# Patient Record
Sex: Male | Born: 1940
Health system: Southern US, Community
[De-identification: ages and names within clinical notes are randomized; demographics above are authoritative.]

## PROBLEM LIST (undated history)

## (undated) DIAGNOSIS — Z7901 Long term (current) use of anticoagulants: Secondary | ICD-10-CM

## (undated) DIAGNOSIS — Z87442 Personal history of urinary calculi: Secondary | ICD-10-CM

## (undated) DIAGNOSIS — Z86010 Personal history of colonic polyps: Secondary | ICD-10-CM

## (undated) DIAGNOSIS — I499 Cardiac arrhythmia, unspecified: Secondary | ICD-10-CM

## (undated) DIAGNOSIS — I10 Essential (primary) hypertension: Secondary | ICD-10-CM

## (undated) DIAGNOSIS — C61 Malignant neoplasm of prostate: Secondary | ICD-10-CM

## (undated) DIAGNOSIS — K602 Anal fissure, unspecified: Secondary | ICD-10-CM

## (undated) DIAGNOSIS — M1990 Unspecified osteoarthritis, unspecified site: Secondary | ICD-10-CM

## (undated) DIAGNOSIS — C4490 Unspecified malignant neoplasm of skin, unspecified: Secondary | ICD-10-CM

## (undated) DIAGNOSIS — I4891 Unspecified atrial fibrillation: Secondary | ICD-10-CM

## (undated) DIAGNOSIS — K219 Gastro-esophageal reflux disease without esophagitis: Secondary | ICD-10-CM

## (undated) DIAGNOSIS — H409 Unspecified glaucoma: Secondary | ICD-10-CM

## (undated) DIAGNOSIS — Z9289 Personal history of other medical treatment: Secondary | ICD-10-CM

## (undated) DIAGNOSIS — R1013 Epigastric pain: Secondary | ICD-10-CM

## (undated) HISTORY — PX: UPPER GASTROINTESTINAL ENDOSCOPY: SHX188

## (undated) HISTORY — DX: Unspecified atrial fibrillation: I48.91

## (undated) HISTORY — DX: Gastro-esophageal reflux disease without esophagitis: K21.9

## (undated) HISTORY — DX: Essential (primary) hypertension: I10

## (undated) HISTORY — DX: Personal history of colonic polyps: Z86.010

## (undated) HISTORY — PX: OTHER SURGICAL HISTORY: SHX169

## (undated) HISTORY — DX: Unspecified glaucoma: H40.9

## (undated) HISTORY — PX: COLONOSCOPY: SHX174

## (undated) HISTORY — DX: Long term (current) use of anticoagulants: Z79.01

## (undated) HISTORY — DX: Personal history of other medical treatment: Z92.89

## (undated) HISTORY — PX: COLONOSCOPY WITH ESOPHAGOGASTRODUODENOSCOPY (EGD): SHX5779

## (undated) HISTORY — DX: Epigastric pain: R10.13

## (undated) HISTORY — PX: PROSTATE BIOPSY: SHX241

## (undated) HISTORY — DX: Anal fissure, unspecified: K60.2

## (undated) HISTORY — DX: Personal history of urinary calculi: Z87.442

---

## 1977-12-31 HISTORY — PX: BACK SURGERY: SHX140

## 2006-08-01 ENCOUNTER — Emergency Department (HOSPITAL_COMMUNITY): Admission: EM | Admit: 2006-08-01 | Discharge: 2006-08-01 | Payer: Self-pay | Admitting: Emergency Medicine

## 2010-01-23 ENCOUNTER — Encounter: Admission: RE | Admit: 2010-01-23 | Discharge: 2010-01-23 | Payer: Self-pay | Admitting: Cardiology

## 2010-01-27 ENCOUNTER — Ambulatory Visit (HOSPITAL_COMMUNITY): Admission: RE | Admit: 2010-01-27 | Discharge: 2010-01-27 | Payer: Self-pay | Admitting: Cardiology

## 2010-08-07 ENCOUNTER — Ambulatory Visit: Payer: Self-pay | Admitting: Cardiology

## 2010-09-07 ENCOUNTER — Ambulatory Visit: Payer: Self-pay | Admitting: Cardiology

## 2010-09-21 ENCOUNTER — Ambulatory Visit: Payer: Self-pay | Admitting: Cardiology

## 2010-10-05 ENCOUNTER — Ambulatory Visit: Payer: Self-pay | Admitting: Cardiology

## 2010-10-19 ENCOUNTER — Ambulatory Visit: Payer: Self-pay | Admitting: Cardiology

## 2010-11-02 ENCOUNTER — Ambulatory Visit: Payer: Self-pay | Admitting: Cardiology

## 2010-11-17 ENCOUNTER — Ambulatory Visit: Payer: Self-pay | Admitting: Cardiology

## 2010-12-01 ENCOUNTER — Ambulatory Visit: Payer: Self-pay | Admitting: Cardiology

## 2010-12-31 DIAGNOSIS — Z860100 Personal history of colon polyps, unspecified: Secondary | ICD-10-CM

## 2010-12-31 HISTORY — DX: Personal history of colon polyps, unspecified: Z86.0100

## 2010-12-31 HISTORY — DX: Personal history of colonic polyps: Z86.010

## 2011-01-10 ENCOUNTER — Ambulatory Visit: Payer: Self-pay | Admitting: Cardiology

## 2011-01-31 ENCOUNTER — Other Ambulatory Visit: Payer: Self-pay

## 2011-02-05 ENCOUNTER — Other Ambulatory Visit (INDEPENDENT_AMBULATORY_CARE_PROVIDER_SITE_OTHER): Payer: Medicare Other

## 2011-02-05 DIAGNOSIS — I4891 Unspecified atrial fibrillation: Secondary | ICD-10-CM

## 2011-02-05 DIAGNOSIS — Z7901 Long term (current) use of anticoagulants: Secondary | ICD-10-CM

## 2011-02-21 ENCOUNTER — Encounter (INDEPENDENT_AMBULATORY_CARE_PROVIDER_SITE_OTHER): Payer: Medicare Other

## 2011-02-21 DIAGNOSIS — R079 Chest pain, unspecified: Secondary | ICD-10-CM

## 2011-02-21 DIAGNOSIS — Z7901 Long term (current) use of anticoagulants: Secondary | ICD-10-CM

## 2011-02-21 DIAGNOSIS — I4891 Unspecified atrial fibrillation: Secondary | ICD-10-CM

## 2011-05-29 ENCOUNTER — Ambulatory Visit: Payer: Medicare Other | Admitting: Cardiology

## 2011-05-29 ENCOUNTER — Telehealth: Payer: Self-pay | Admitting: Cardiology

## 2011-05-31 ENCOUNTER — Encounter: Payer: Self-pay | Admitting: Cardiology

## 2011-05-31 DIAGNOSIS — I4891 Unspecified atrial fibrillation: Secondary | ICD-10-CM | POA: Insufficient documentation

## 2011-05-31 DIAGNOSIS — I4821 Permanent atrial fibrillation: Secondary | ICD-10-CM | POA: Insufficient documentation

## 2011-06-01 ENCOUNTER — Encounter: Payer: Medicare Other | Admitting: *Deleted

## 2011-06-11 ENCOUNTER — Ambulatory Visit (INDEPENDENT_AMBULATORY_CARE_PROVIDER_SITE_OTHER): Payer: Medicare Other | Admitting: *Deleted

## 2011-06-11 DIAGNOSIS — I4891 Unspecified atrial fibrillation: Secondary | ICD-10-CM

## 2011-07-09 ENCOUNTER — Ambulatory Visit (INDEPENDENT_AMBULATORY_CARE_PROVIDER_SITE_OTHER): Payer: Medicare Other | Admitting: *Deleted

## 2011-07-09 DIAGNOSIS — I4891 Unspecified atrial fibrillation: Secondary | ICD-10-CM

## 2011-07-10 ENCOUNTER — Telehealth: Payer: Self-pay | Admitting: Cardiology

## 2011-07-10 NOTE — Telephone Encounter (Signed)
Ms. Lumadue said she needs to speak with Juliette Alcide.  Has questions on the instructions with patient medication.  They will be leaving in about 30 minutes.  Does not want to leave message, wants to speak with [facility].  Advised to patient wife that Juliette Alcide is on the phone.  Placed patient on hold.  Lost call.. Will advise nurse.

## 2011-07-10 NOTE — Telephone Encounter (Signed)
Needs you to call her back around 3:30 because she had a question about how to split up her husbands medication doses. Please call back. I have pulled his chart.

## 2011-07-10 NOTE — Telephone Encounter (Signed)
Questions about coumadin dose, went over with wife

## 2011-07-20 ENCOUNTER — Other Ambulatory Visit: Payer: Self-pay | Admitting: *Deleted

## 2011-07-20 MED ORDER — SPIRONOLACTONE 25 MG PO TABS: 25.0000 mg | ORAL_TABLET | Freq: Every day | ORAL | Status: DC

## 2011-07-20 NOTE — Telephone Encounter (Signed)
Refilled meds per fax request.  

## 2011-07-23 ENCOUNTER — Ambulatory Visit (INDEPENDENT_AMBULATORY_CARE_PROVIDER_SITE_OTHER): Payer: Medicare Other | Admitting: *Deleted

## 2011-07-23 ENCOUNTER — Encounter: Payer: Medicare Other | Admitting: *Deleted

## 2011-07-23 ENCOUNTER — Other Ambulatory Visit: Payer: Self-pay | Admitting: *Deleted

## 2011-07-23 DIAGNOSIS — I4891 Unspecified atrial fibrillation: Secondary | ICD-10-CM

## 2011-08-17 ENCOUNTER — Other Ambulatory Visit: Payer: Self-pay | Admitting: *Deleted

## 2011-08-17 DIAGNOSIS — I119 Hypertensive heart disease without heart failure: Secondary | ICD-10-CM

## 2011-08-17 MED ORDER — OLMESARTAN MEDOXOMIL-HCTZ 40-25 MG PO TABS: 1.0000 | ORAL_TABLET | Freq: Every day | ORAL | Status: DC

## 2011-08-17 NOTE — Telephone Encounter (Signed)
Refilled meds per fax request.  

## 2011-08-20 ENCOUNTER — Ambulatory Visit (INDEPENDENT_AMBULATORY_CARE_PROVIDER_SITE_OTHER): Payer: Medicare Other | Admitting: *Deleted

## 2011-08-20 DIAGNOSIS — I4891 Unspecified atrial fibrillation: Secondary | ICD-10-CM

## 2011-08-20 LAB — POCT INR: INR: 2.7

## 2011-09-17 ENCOUNTER — Encounter: Payer: Medicare Other | Admitting: *Deleted

## 2011-09-19 ENCOUNTER — Ambulatory Visit (INDEPENDENT_AMBULATORY_CARE_PROVIDER_SITE_OTHER): Payer: Medicare Other | Admitting: *Deleted

## 2011-09-19 DIAGNOSIS — I4891 Unspecified atrial fibrillation: Secondary | ICD-10-CM

## 2011-09-27 ENCOUNTER — Other Ambulatory Visit: Payer: Self-pay | Admitting: *Deleted

## 2011-09-27 MED ORDER — ATENOLOL 50 MG PO TABS: 50.0000 mg | ORAL_TABLET | Freq: Every day | ORAL | Status: DC

## 2011-09-27 NOTE — Telephone Encounter (Signed)
Refilled atenolol

## 2011-10-10 ENCOUNTER — Encounter: Payer: Medicare Other | Admitting: *Deleted

## 2011-10-10 ENCOUNTER — Ambulatory Visit (INDEPENDENT_AMBULATORY_CARE_PROVIDER_SITE_OTHER): Payer: Medicare Other | Admitting: *Deleted

## 2011-10-10 DIAGNOSIS — Z7901 Long term (current) use of anticoagulants: Secondary | ICD-10-CM | POA: Insufficient documentation

## 2011-10-10 DIAGNOSIS — I4891 Unspecified atrial fibrillation: Secondary | ICD-10-CM

## 2011-10-10 LAB — POCT INR: INR: 2.6

## 2011-11-07 ENCOUNTER — Ambulatory Visit (INDEPENDENT_AMBULATORY_CARE_PROVIDER_SITE_OTHER): Payer: Medicare Other | Admitting: *Deleted

## 2011-11-07 DIAGNOSIS — Z7901 Long term (current) use of anticoagulants: Secondary | ICD-10-CM

## 2011-11-07 DIAGNOSIS — I4891 Unspecified atrial fibrillation: Secondary | ICD-10-CM

## 2011-11-07 LAB — POCT INR: INR: 3.5

## 2011-11-07 NOTE — Telephone Encounter (Signed)
No action required.

## 2011-11-21 ENCOUNTER — Ambulatory Visit (INDEPENDENT_AMBULATORY_CARE_PROVIDER_SITE_OTHER): Payer: Medicare Other | Admitting: *Deleted

## 2011-11-21 DIAGNOSIS — Z7901 Long term (current) use of anticoagulants: Secondary | ICD-10-CM

## 2011-11-21 DIAGNOSIS — I4891 Unspecified atrial fibrillation: Secondary | ICD-10-CM

## 2011-11-21 LAB — POCT INR: INR: 2.8

## 2011-12-11 ENCOUNTER — Ambulatory Visit: Payer: Medicare Other | Admitting: Gastroenterology

## 2011-12-19 ENCOUNTER — Encounter: Payer: Medicare Other | Admitting: *Deleted

## 2011-12-24 ENCOUNTER — Encounter: Payer: Medicare Other | Admitting: *Deleted

## 2012-01-02 ENCOUNTER — Other Ambulatory Visit: Payer: Self-pay | Admitting: Cardiology

## 2012-01-04 ENCOUNTER — Ambulatory Visit (INDEPENDENT_AMBULATORY_CARE_PROVIDER_SITE_OTHER): Payer: Medicare Other | Admitting: Gastroenterology

## 2012-01-04 ENCOUNTER — Encounter: Payer: Self-pay | Admitting: Gastroenterology

## 2012-01-04 VITALS — BP 132/68 | HR 60 | Ht 71.0 in | Wt 223.0 lb

## 2012-01-04 DIAGNOSIS — K219 Gastro-esophageal reflux disease without esophagitis: Secondary | ICD-10-CM

## 2012-01-04 NOTE — Progress Notes (Signed)
HPI: This is a  very pleasant 72 year old man who I am meeting for the first time today.  Was having a lot of pyoris, belching.  Was put PPI once daily usually in AM, then eats BF 30 min later.  One month and on that pill he is back to normal.  He was pretty miserable for about 6 months to 1 year.  Has had minor dysphagia at times.  His weight has been stable, maybe up.   No vomiting, no overt bleeding.  Never had EGD.  Did have colonoscopy with Dr. Elnoria Howard and he was happy with that care.  He has started dinking vodka tonics a bit more over the past year.  Used to drink 1/2 glass wine but started drinking more vodka about a year ago.  Recently quit altogether.  Review of systems: Pertinent positive and negative review of systems were noted in the above HPI section. Complete review of systems was performed and was otherwise normal.    Past Medical History  Diagnosis Date  . Hypertension   . Campath-induced atrial fibrillation     maintaining normal sinus rhythm  . Dyspepsia   . Chronic anticoagulation     coumadin  . History of kidney stones   . Anal fissure     hx of   . History of colon polyps 2012    Colonoscopy-Dr. Elnoria Howard     Past Surgical History  Procedure Date  . Back surgery 12/31/1977    for degenerative disc disease    Current Outpatient Prescriptions  Medication Sig Dispense Refill  . aspirin 81 MG chewable tablet Chew 81 mg by mouth daily.        [name] atenolol (TENORMIN) 50 MG tablet TAKE 1 TABLET (50 MG TOTAL) BY MOUTH DAILY.  90 tablet  5  . Methylcellulose, Laxative, (CITRUCEL PO) Take by mouth as directed.        . Multiple Vitamins-Minerals (CENTRUM SILVER PO) Take 1 tablet by mouth every morning.        . olmesartan-hydrochlorothiazide (BENICAR HCT) 40-25 MG per tablet Take 1 tablet by mouth daily.  30 tablet  5  . omeprazole (PRILOSEC) 40 MG capsule Take 40 mg by mouth daily.        [name] spironolactone (ALDACTONE) 25 MG tablet Take 1 tablet (25 mg total) by mouth  daily.  60 tablet  3  . warfarin (COUMADIN) 5 MG tablet Take 5 mg by mouth daily. Take as directed by coumadin clinic         Allergies as of 01/04/2012 - Review Complete 01/04/2012  Allergen Reaction Noted  . Amlodipine  05/31/2011  . Lisinopril Cough 05/31/2011    Family History  Problem Relation Age of Onset  . Heart failure Father   . Breast cancer Mother   . Cancer Father   . Colon cancer Maternal Grandmother     History   Social History  . Marital Status: Single    Spouse Name: N/A    Number of Children: 3  . Years of Education: N/A   Occupational History  . Retired    Social History Main Topics  . Smoking status: Former Smoker    Types: Cigarettes    Quit date: 12/31/1966  . Smokeless tobacco: Never Used  . Alcohol Use: 0.0 oz/week     1 daily   . Drug Use: No  . Sexually Active: Not on file   Other Topics Concern  . Not on file   Social History Narrative  Daily caffeine       Physical Exam: BP 132/68  Pulse 60  Ht 5\' 11"  (1.803 m)  Wt 223 lb (101.152 kg)  BMI 31.10 kg/m2 Constitutional: generally well-appearing Psychiatric: alert and oriented x3 Eyes: extraocular movements intact Mouth: oral pharynx moist, no lesions Neck: supple no lymphadenopathy Cardiovascular: heart regular rate and rhythm Lungs: clear to auscultation bilaterally Abdomen: soft, nontender, nondistended, no obvious ascites, no peritoneal signs, normal bowel sounds Extremities: no lower extremity edema bilaterally Skin: no lesions on visible extremities    Assessment and plan: 72 y.o. male with  pyrosis, mild intermittent dysphagia  I will proceed with EGD at his soonest convenience. Proton pump inhibitor seems to be working very well and he will continue that. The timing of the onset of his symptoms seems to coincide with his drinking more alcohol than usual and he has since cut back quite dramatically. I see no reason for any further blood tests or imaging studies  before the endoscopy

## 2012-01-04 NOTE — Patient Instructions (Signed)
You will be set up for an upper endoscopy. Stay on omeprazole once daily for another 2 months, then try cutting back to every other day if tolerated by heartburn symptoms. A copy of this information will be made available to [name]. Can stay on your coumadin before the test.

## 2012-01-09 ENCOUNTER — Ambulatory Visit (AMBULATORY_SURGERY_CENTER): Payer: Medicare Other | Admitting: Gastroenterology

## 2012-01-09 ENCOUNTER — Telehealth: Payer: Self-pay | Admitting: Gastroenterology

## 2012-01-09 ENCOUNTER — Encounter: Payer: Self-pay | Admitting: Gastroenterology

## 2012-01-09 VITALS — BP 131/79 | HR 52 | Temp 97.1°F | Resp 20 | Ht 71.0 in | Wt 223.0 lb

## 2012-01-09 DIAGNOSIS — K219 Gastro-esophageal reflux disease without esophagitis: Secondary | ICD-10-CM

## 2012-01-09 DIAGNOSIS — K2990 Gastroduodenitis, unspecified, without bleeding: Secondary | ICD-10-CM

## 2012-01-09 MED ORDER — SODIUM CHLORIDE 0.9 % IV SOLN: 500.0000 mL | INTRAVENOUS | Status: DC

## 2012-01-09 NOTE — Patient Instructions (Signed)
Continue ppi once daily for 2 months then try cutting back to one pill every other day.D/C instructions reviewed with family. Information given on esophagitis,gastritis and hiatal hernia.Resume remainder of medication.

## 2012-01-09 NOTE — Telephone Encounter (Signed)
Colonoscopy, August 2011, Dr. Jeani Hawking: Indication was family history of colon cancer. 2 small polyps were found, these were adenomatous on biopsy. Recall colonoscopy at 5 year interval. Also noted hemorrhoids.  Patty,  Can you please put him in for repeat colonoscopy August 2016 please

## 2012-01-09 NOTE — Telephone Encounter (Signed)
Recall in EPIC 

## 2012-01-09 NOTE — Progress Notes (Signed)
Patient did not experience any of the following events: a burn prior to discharge; a fall within the facility; wrong site/side/patient/procedure/implant event; or a hospital transfer or hospital admission upon discharge from the facility. (G8907) Patient did not have preoperative order for IV antibiotic SSI prophylaxis. (G8918)  

## 2012-01-09 NOTE — Progress Notes (Signed)
At the end of the procedure, pt's sats drop to 78%  o2 up to 4/L and sats up to 92-96%

## 2012-01-09 NOTE — Op Note (Signed)
[facility] [address] [place], [place]  [address]  ENDOSCOPY PROCEDURE REPORT  PATIENT:  Daniel, Love  MR#:  [id number] BIRTHDATE:  [birth year], 71 yrs. old  GENDER:  male ENDOSCOPIST:  Rachael Fee, MD Referred by:  Italy Badger, M.D. PROCEDURE DATE:  01/09/2012 PROCEDURE:  EGD, diagnostic 43235 ASA CLASS:  Class II INDICATIONS:  GERD, intermittent mild dysphagia MEDICATIONS:    Fentanyl 50 mcg IV, These medications were titrated to patient response per physician's verbal order, Versed 6 mg IV TOPICAL ANESTHETIC:  Cetacaine Spray  DESCRIPTION OF PROCEDURE:   After the risks benefits and alternatives of the procedure were thoroughly explained, informed consent was obtained.  The LB [id number]-H180 [id number] endoscope was introduced through the mouth and advanced to the second portion of the duodenum, without limitations.  The instrument was slowly withdrawn as the mucosa was fully examined. <<PROCEDUREIMAGES>> Esophagitis was found. There was mild, reflux related distal esophagitis (erythema at GE junction) (see image1).  There was mild, non-specific gastritis (see image2).  A hiatal hernia was found. This was 1-2cm.  Otherwise the examination was normal (see image3, image4, and image5).    Retroflexed views revealed no abnormalities.    The scope was then withdrawn from the patient and the procedure completed. COMPLICATIONS:  None  ENDOSCOPIC IMPRESSION: 1) Esophagitis, reflux related 2) Mild gastritis 3) Small hiatal hernia 4) Otherwise normal examination  RECOMMENDATIONS: Continue taking the PPI once daily for another 2 months, then try cutting back to one pill every other day.  ______________________________ Rachael Fee, MD  n. eSIGNED:   Rachael Fee at 01/09/2012 09:11 AM  Earlene Plater, [phone]

## 2012-01-10 ENCOUNTER — Telehealth: Payer: Self-pay | Admitting: *Deleted

## 2012-01-10 NOTE — Telephone Encounter (Signed)

## 2012-01-15 ENCOUNTER — Telehealth: Payer: Self-pay

## 2012-01-15 NOTE — Telephone Encounter (Signed)
Received fax refill request for Warfarin 5mg  tablets from [facility], pt is past due for INR check.  Attempted to call pt to schedule f/u appt.  LMOM TCB to schedule.

## 2012-01-17 ENCOUNTER — Other Ambulatory Visit: Payer: Self-pay | Admitting: Cardiology

## 2012-01-17 MED ORDER — WARFARIN SODIUM 5 MG PO TABS: ORAL_TABLET | ORAL | Status: DC

## 2012-01-17 NOTE — Telephone Encounter (Signed)
Coumadin 10 tablets sent to pharmacy. The pt is overdue for his INR. Will give additional tablets after appt completed 1/21. Appt scheduled for the pt. Wife aware of need for appt and that pt is overdue.

## 2012-01-21 ENCOUNTER — Ambulatory Visit (INDEPENDENT_AMBULATORY_CARE_PROVIDER_SITE_OTHER): Payer: Medicare Other | Admitting: *Deleted

## 2012-01-21 DIAGNOSIS — I4891 Unspecified atrial fibrillation: Secondary | ICD-10-CM

## 2012-01-21 DIAGNOSIS — Z7901 Long term (current) use of anticoagulants: Secondary | ICD-10-CM

## 2012-01-21 MED ORDER — WARFARIN SODIUM 5 MG PO TABS: ORAL_TABLET | ORAL | Status: DC

## 2012-02-15 ENCOUNTER — Ambulatory Visit (INDEPENDENT_AMBULATORY_CARE_PROVIDER_SITE_OTHER): Payer: Medicare Other | Admitting: *Deleted

## 2012-02-15 DIAGNOSIS — Z7901 Long term (current) use of anticoagulants: Secondary | ICD-10-CM

## 2012-02-15 DIAGNOSIS — I4891 Unspecified atrial fibrillation: Secondary | ICD-10-CM

## 2012-02-29 ENCOUNTER — Ambulatory Visit (INDEPENDENT_AMBULATORY_CARE_PROVIDER_SITE_OTHER): Payer: Medicare Other | Admitting: *Deleted

## 2012-02-29 DIAGNOSIS — I4891 Unspecified atrial fibrillation: Secondary | ICD-10-CM

## 2012-02-29 DIAGNOSIS — Z7901 Long term (current) use of anticoagulants: Secondary | ICD-10-CM

## 2012-02-29 LAB — POCT INR: INR: 2.2

## 2012-03-13 ENCOUNTER — Other Ambulatory Visit: Payer: Self-pay | Admitting: *Deleted

## 2012-03-13 DIAGNOSIS — I119 Hypertensive heart disease without heart failure: Secondary | ICD-10-CM

## 2012-03-13 MED ORDER — OLMESARTAN MEDOXOMIL-HCTZ 40-25 MG PO TABS: 1.0000 | ORAL_TABLET | Freq: Every day | ORAL | Status: DC

## 2012-03-13 MED ORDER — SPIRONOLACTONE 25 MG PO TABS: 25.0000 mg | ORAL_TABLET | Freq: Every day | ORAL | Status: DC

## 2012-03-21 ENCOUNTER — Ambulatory Visit (INDEPENDENT_AMBULATORY_CARE_PROVIDER_SITE_OTHER): Payer: Medicare Other | Admitting: *Deleted

## 2012-03-21 DIAGNOSIS — I4891 Unspecified atrial fibrillation: Secondary | ICD-10-CM

## 2012-03-21 DIAGNOSIS — Z7901 Long term (current) use of anticoagulants: Secondary | ICD-10-CM

## 2012-04-11 ENCOUNTER — Ambulatory Visit (INDEPENDENT_AMBULATORY_CARE_PROVIDER_SITE_OTHER): Payer: Medicare Other | Admitting: Pharmacist

## 2012-04-11 DIAGNOSIS — Z7901 Long term (current) use of anticoagulants: Secondary | ICD-10-CM

## 2012-04-11 DIAGNOSIS — I4891 Unspecified atrial fibrillation: Secondary | ICD-10-CM

## 2012-04-19 ENCOUNTER — Other Ambulatory Visit: Payer: Self-pay | Admitting: Cardiovascular Disease

## 2012-05-09 ENCOUNTER — Ambulatory Visit (INDEPENDENT_AMBULATORY_CARE_PROVIDER_SITE_OTHER): Payer: Medicare Other | Admitting: Pharmacist

## 2012-05-09 DIAGNOSIS — I4891 Unspecified atrial fibrillation: Secondary | ICD-10-CM

## 2012-05-09 DIAGNOSIS — Z7901 Long term (current) use of anticoagulants: Secondary | ICD-10-CM

## 2012-05-30 ENCOUNTER — Ambulatory Visit (INDEPENDENT_AMBULATORY_CARE_PROVIDER_SITE_OTHER): Payer: Medicare Other | Admitting: Pharmacist

## 2012-05-30 DIAGNOSIS — I4891 Unspecified atrial fibrillation: Secondary | ICD-10-CM

## 2012-05-30 DIAGNOSIS — Z7901 Long term (current) use of anticoagulants: Secondary | ICD-10-CM

## 2012-05-30 LAB — POCT INR: INR: 2.1

## 2012-06-27 ENCOUNTER — Ambulatory Visit (INDEPENDENT_AMBULATORY_CARE_PROVIDER_SITE_OTHER): Payer: Medicare Other | Admitting: *Deleted

## 2012-06-27 DIAGNOSIS — Z7901 Long term (current) use of anticoagulants: Secondary | ICD-10-CM

## 2012-06-27 DIAGNOSIS — I4891 Unspecified atrial fibrillation: Secondary | ICD-10-CM

## 2012-07-25 ENCOUNTER — Ambulatory Visit (INDEPENDENT_AMBULATORY_CARE_PROVIDER_SITE_OTHER): Payer: Medicare Other | Admitting: *Deleted

## 2012-07-25 DIAGNOSIS — Z7901 Long term (current) use of anticoagulants: Secondary | ICD-10-CM

## 2012-07-25 DIAGNOSIS — I4891 Unspecified atrial fibrillation: Secondary | ICD-10-CM

## 2012-08-11 ENCOUNTER — Ambulatory Visit (INDEPENDENT_AMBULATORY_CARE_PROVIDER_SITE_OTHER): Payer: Medicare Other | Admitting: *Deleted

## 2012-08-11 DIAGNOSIS — I4891 Unspecified atrial fibrillation: Secondary | ICD-10-CM

## 2012-08-11 DIAGNOSIS — Z7901 Long term (current) use of anticoagulants: Secondary | ICD-10-CM

## 2012-08-11 LAB — POCT INR: INR: 2.1

## 2012-09-09 ENCOUNTER — Other Ambulatory Visit: Payer: Self-pay | Admitting: Cardiology

## 2012-09-11 ENCOUNTER — Ambulatory Visit (INDEPENDENT_AMBULATORY_CARE_PROVIDER_SITE_OTHER): Payer: Medicare Other | Admitting: Pharmacist

## 2012-09-11 ENCOUNTER — Other Ambulatory Visit: Payer: Self-pay | Admitting: Cardiovascular Disease

## 2012-09-11 DIAGNOSIS — Z7901 Long term (current) use of anticoagulants: Secondary | ICD-10-CM

## 2012-09-11 DIAGNOSIS — I4891 Unspecified atrial fibrillation: Secondary | ICD-10-CM

## 2012-10-07 ENCOUNTER — Ambulatory Visit (INDEPENDENT_AMBULATORY_CARE_PROVIDER_SITE_OTHER): Payer: Medicare Other | Admitting: *Deleted

## 2012-10-07 DIAGNOSIS — I4891 Unspecified atrial fibrillation: Secondary | ICD-10-CM

## 2012-10-07 DIAGNOSIS — Z7901 Long term (current) use of anticoagulants: Secondary | ICD-10-CM

## 2012-10-07 LAB — POCT INR: INR: 2.7

## 2012-11-03 ENCOUNTER — Other Ambulatory Visit: Payer: Self-pay | Admitting: Cardiology

## 2012-11-04 ENCOUNTER — Ambulatory Visit (INDEPENDENT_AMBULATORY_CARE_PROVIDER_SITE_OTHER): Payer: Medicare Other

## 2012-11-04 DIAGNOSIS — I4891 Unspecified atrial fibrillation: Secondary | ICD-10-CM

## 2012-11-04 DIAGNOSIS — Z7901 Long term (current) use of anticoagulants: Secondary | ICD-10-CM

## 2012-12-02 ENCOUNTER — Ambulatory Visit (INDEPENDENT_AMBULATORY_CARE_PROVIDER_SITE_OTHER): Payer: Medicare Other

## 2012-12-02 DIAGNOSIS — Z7901 Long term (current) use of anticoagulants: Secondary | ICD-10-CM

## 2012-12-02 DIAGNOSIS — I4891 Unspecified atrial fibrillation: Secondary | ICD-10-CM

## 2013-01-13 ENCOUNTER — Ambulatory Visit (INDEPENDENT_AMBULATORY_CARE_PROVIDER_SITE_OTHER): Payer: Medicare Other | Admitting: *Deleted

## 2013-01-13 DIAGNOSIS — I4891 Unspecified atrial fibrillation: Secondary | ICD-10-CM

## 2013-01-13 DIAGNOSIS — Z7901 Long term (current) use of anticoagulants: Secondary | ICD-10-CM

## 2013-01-13 LAB — POCT INR: INR: 2.6

## 2013-01-26 ENCOUNTER — Other Ambulatory Visit: Payer: Self-pay

## 2013-01-26 MED ORDER — ATENOLOL 50 MG PO TABS: 50.0000 mg | ORAL_TABLET | Freq: Every day | ORAL | Status: DC

## 2013-01-29 ENCOUNTER — Other Ambulatory Visit: Payer: Self-pay

## 2013-01-29 MED ORDER — WARFARIN SODIUM 5 MG PO TABS: ORAL_TABLET | ORAL | Status: DC

## 2013-02-24 ENCOUNTER — Ambulatory Visit (INDEPENDENT_AMBULATORY_CARE_PROVIDER_SITE_OTHER): Payer: Medicare Other | Admitting: *Deleted

## 2013-02-24 ENCOUNTER — Encounter: Payer: Self-pay | Admitting: *Deleted

## 2013-02-24 DIAGNOSIS — Z7901 Long term (current) use of anticoagulants: Secondary | ICD-10-CM

## 2013-02-24 DIAGNOSIS — I4891 Unspecified atrial fibrillation: Secondary | ICD-10-CM

## 2013-02-24 LAB — POCT INR: INR: 2.3

## 2013-03-11 ENCOUNTER — Other Ambulatory Visit: Payer: Self-pay | Admitting: *Deleted

## 2013-03-11 MED ORDER — OLMESARTAN MEDOXOMIL-HCTZ 40-25 MG PO TABS: 1.0000 | ORAL_TABLET | Freq: Every day | ORAL | Status: DC

## 2013-03-12 ENCOUNTER — Encounter: Payer: Self-pay | Admitting: Cardiology

## 2013-04-07 ENCOUNTER — Ambulatory Visit (INDEPENDENT_AMBULATORY_CARE_PROVIDER_SITE_OTHER): Payer: Medicare Other | Admitting: *Deleted

## 2013-04-07 DIAGNOSIS — I4891 Unspecified atrial fibrillation: Secondary | ICD-10-CM

## 2013-04-07 DIAGNOSIS — Z7901 Long term (current) use of anticoagulants: Secondary | ICD-10-CM

## 2013-04-07 LAB — POCT INR: INR: 2

## 2013-04-15 ENCOUNTER — Other Ambulatory Visit: Payer: Self-pay | Admitting: *Deleted

## 2013-04-15 MED ORDER — OLMESARTAN MEDOXOMIL-HCTZ 40-25 MG PO TABS: 1.0000 | ORAL_TABLET | Freq: Every day | ORAL | Status: DC

## 2013-04-24 ENCOUNTER — Other Ambulatory Visit: Payer: Self-pay

## 2013-04-24 MED ORDER — SPIRONOLACTONE 25 MG PO TABS: 25.0000 mg | ORAL_TABLET | Freq: Every day | ORAL | Status: DC

## 2013-05-14 ENCOUNTER — Ambulatory Visit (INDEPENDENT_AMBULATORY_CARE_PROVIDER_SITE_OTHER): Payer: Medicare Other | Admitting: Cardiology

## 2013-05-14 ENCOUNTER — Ambulatory Visit (INDEPENDENT_AMBULATORY_CARE_PROVIDER_SITE_OTHER): Payer: Medicare Other

## 2013-05-14 ENCOUNTER — Encounter: Payer: Self-pay | Admitting: Cardiology

## 2013-05-14 VITALS — BP 140/66 | HR 55 | Ht 72.0 in | Wt 188.1 lb

## 2013-05-14 DIAGNOSIS — Z7901 Long term (current) use of anticoagulants: Secondary | ICD-10-CM

## 2013-05-14 DIAGNOSIS — I4891 Unspecified atrial fibrillation: Secondary | ICD-10-CM

## 2013-05-14 DIAGNOSIS — I119 Hypertensive heart disease without heart failure: Secondary | ICD-10-CM | POA: Insufficient documentation

## 2013-05-14 LAB — POCT INR: INR: 2.2

## 2013-05-14 MED ORDER — ATENOLOL 50 MG PO TABS: 50.0000 mg | ORAL_TABLET | Freq: Every day | ORAL | Status: DC

## 2013-05-14 MED ORDER — OLMESARTAN MEDOXOMIL-HCTZ 40-25 MG PO TABS: 1.0000 | ORAL_TABLET | Freq: Every day | ORAL | Status: DC

## 2013-05-14 MED ORDER — SPIRONOLACTONE 25 MG PO TABS: 25.0000 mg | ORAL_TABLET | Freq: Every day | ORAL | Status: DC

## 2013-05-14 NOTE — Assessment & Plan Note (Signed)
The patient is in atrial fibrillation.  He does not know when he went back in atrial fibrillation after his successful cardioversion in 2011.  He is not having symptoms from his atrial fibrillation and he is already anticoagulated with Coumadin.  We will continue him for rate control and we do not anticipate any further attempts to cardiovert the patient.

## 2013-05-14 NOTE — Assessment & Plan Note (Signed)
Patient has a history of high blood pressure.  He recently ran out of his Benicar HCT and when he was not taking he noted progressive weight gain.  He restarted the Benicar HCT and his weight has come back down.

## 2013-05-14 NOTE — Patient Instructions (Addendum)
Your physician recommends that you continue on your current medications as directed. Please refer to the Current Medication list given to you today.  Your physician wants you to follow-up in: 1 year. You will receive a reminder letter in the mail two months in advance. If you don't receive a letter, please call our office to schedule the follow-up appointment.  

## 2013-05-14 NOTE — Progress Notes (Signed)
Daniel Love Date of Birth:  1940-11-27 [facility] [address] [place], [place]  [address] [phone]         Fax   [phone]  History of Present Illness: This 73 year old gentleman is seen after a three-year absence.  He has a history of paroxysmal atrial fibrillation.  We saw him in December 2010 at which time he was in atrial fibrillation with a controlled ventricular response.  He underwent cardioversion on 01/27/2010.  We have not seen him since then in the office although he has continued to come in for his prothrombin time checks.  He came in today because he was told he needed to be seen in order to get some of the medications refilled.  He has not noticed any new cardiac symptoms.  He has not been aware of an irregular pulse.  He has not had exertional dyspnea or chest pain or dizziness or syncope.  EKG today shows atrial fibrillation with a controlled ventricular response.   Current Outpatient Prescriptions  Medication Sig Dispense Refill  . aspirin 81 MG chewable tablet Chew 81 mg by mouth daily.        [name] atenolol (TENORMIN) 50 MG tablet Take 1 tablet (50 mg total) by mouth daily.  30 tablet  11  . Methylcellulose, Laxative, (CITRUCEL PO) Take by mouth as directed.        . Multiple Vitamins-Minerals (CENTRUM SILVER PO) Take 1 tablet by mouth every morning.        . nystatin cream (MYCOSTATIN)       . olmesartan-hydrochlorothiazide (BENICAR HCT) 40-25 MG per tablet Take 1 tablet by mouth daily.  30 tablet  11  . omeprazole (PRILOSEC) 40 MG capsule Take 40 mg by mouth daily.        [name] spironolactone (ALDACTONE) 25 MG tablet Take 1 tablet (25 mg total) by mouth daily.  30 tablet  11  . warfarin (COUMADIN) 5 MG tablet Take as directed by anticoagulation clinic  40 tablet  3   No current facility-administered medications for this visit.    Allergies  Allergen Reactions  . Amlodipine     Reaction-edema  . Lisinopril Cough    Patient Active  Problem List   Diagnosis Date Noted  . Encounter for long-term (current) use of anticoagulants 10/10/2011  . Atrial fibrillation   . Atrial fibrillation     History  Smoking status  . Former Smoker  . Types: Cigarettes  . Quit date: 12/31/1966  Smokeless tobacco  . Never Used    History  Alcohol Use  . 0.0 oz/week    Comment: 1 daily     Family History  Problem Relation Age of Onset  . Heart failure Father   . Breast cancer Mother   . Cancer Father   . Colon cancer Maternal Grandmother     Review of Systems: Constitutional: no fever chills diaphoresis or fatigue or change in weight.  Head and neck: no hearing loss, no epistaxis, no photophobia or visual disturbance. Respiratory: No cough, shortness of breath or wheezing. Cardiovascular: No chest pain peripheral edema, palpitations. Gastrointestinal: No abdominal distention, no abdominal pain, no change in bowel habits hematochezia or melena. Genitourinary: No dysuria, no frequency, no urgency, no nocturia. Musculoskeletal:No arthralgias, no back pain, no gait disturbance or myalgias. Neurological: No dizziness, no headaches, no numbness, no seizures, no syncope, no weakness, no tremors. Hematologic: No lymphadenopathy, no easy bruising. Psychiatric: No confusion, no hallucinations, no sleep disturbance.    Physical  Exam: Filed Vitals:   05/14/13 1512  BP: 140/66  Pulse: 55   the general appearance reveals a well-developed well-nourished gentleman in no distress.The head and neck exam reveals pupils equal and reactive.  Extraocular movements are full.  There is no scleral icterus.  The mouth and pharynx are normal.  The neck is supple.  The carotids reveal no bruits.  The jugular venous pressure is normal.  The  thyroid is not enlarged.  There is no lymphadenopathy.  The chest is clear to percussion and auscultation.  There are no rales or rhonchi.  Expansion of the chest is symmetrical.  The pulse is slightly  irregular. The precordium is quiet.  The first heart sound is normal.  The second heart sound is physiologically split.  There is no murmur gallop rub or click.  There is no abnormal lift or heave.  The abdomen is soft and nontender.  The bowel sounds are normal.  The liver and spleen are not enlarged.  There are no abdominal masses.  There are no abdominal bruits.  Extremities reveal good pedal pulses.  There is no phlebitis or edema.  There is no cyanosis or clubbing.  Strength is normal and symmetrical in all extremities.  There is no lateralizing weakness.  There are no sensory deficits.  The skin is warm and dry.  There is no rash.   EKG shows atrial fibrillation with slow ventricular response and no ischemic changes.  Assessment / Plan: Continue same medication.  Recheck in one year for followup office visit and EKG.

## 2013-05-14 NOTE — Assessment & Plan Note (Signed)
We talked about the importance of long-term Coumadin because he is unaware as to when he is in sinus rhythm and when he is in atrial fibrillation.

## 2013-06-10 ENCOUNTER — Other Ambulatory Visit: Payer: Self-pay | Admitting: Cardiology

## 2013-07-06 ENCOUNTER — Ambulatory Visit (INDEPENDENT_AMBULATORY_CARE_PROVIDER_SITE_OTHER): Payer: Medicare Other | Admitting: *Deleted

## 2013-07-06 DIAGNOSIS — I4891 Unspecified atrial fibrillation: Secondary | ICD-10-CM

## 2013-07-06 DIAGNOSIS — Z7901 Long term (current) use of anticoagulants: Secondary | ICD-10-CM

## 2013-07-06 LAB — POCT INR: INR: 2.8

## 2013-08-17 ENCOUNTER — Ambulatory Visit (INDEPENDENT_AMBULATORY_CARE_PROVIDER_SITE_OTHER): Payer: Medicare Other | Admitting: Pharmacist

## 2013-08-17 DIAGNOSIS — I4891 Unspecified atrial fibrillation: Secondary | ICD-10-CM

## 2013-08-17 DIAGNOSIS — Z7901 Long term (current) use of anticoagulants: Secondary | ICD-10-CM

## 2013-08-17 LAB — POCT INR: INR: 2.5

## 2013-09-28 ENCOUNTER — Ambulatory Visit (INDEPENDENT_AMBULATORY_CARE_PROVIDER_SITE_OTHER): Payer: Medicare Other | Admitting: *Deleted

## 2013-09-28 DIAGNOSIS — I4891 Unspecified atrial fibrillation: Secondary | ICD-10-CM

## 2013-09-28 DIAGNOSIS — Z7901 Long term (current) use of anticoagulants: Secondary | ICD-10-CM

## 2013-09-28 LAB — POCT INR: INR: 2

## 2013-09-28 MED ORDER — WARFARIN SODIUM 5 MG PO TABS: ORAL_TABLET | ORAL | Status: DC

## 2013-11-02 ENCOUNTER — Other Ambulatory Visit: Payer: Self-pay | Admitting: Cardiology

## 2013-11-09 ENCOUNTER — Ambulatory Visit (INDEPENDENT_AMBULATORY_CARE_PROVIDER_SITE_OTHER): Payer: Medicare Other | Admitting: Pharmacist

## 2013-11-09 DIAGNOSIS — Z7901 Long term (current) use of anticoagulants: Secondary | ICD-10-CM

## 2013-11-09 DIAGNOSIS — I4891 Unspecified atrial fibrillation: Secondary | ICD-10-CM

## 2013-11-09 LAB — POCT INR: INR: 1.9

## 2013-12-21 ENCOUNTER — Ambulatory Visit (INDEPENDENT_AMBULATORY_CARE_PROVIDER_SITE_OTHER): Payer: Medicare Other | Admitting: *Deleted

## 2013-12-21 DIAGNOSIS — I4891 Unspecified atrial fibrillation: Secondary | ICD-10-CM

## 2013-12-21 DIAGNOSIS — Z7901 Long term (current) use of anticoagulants: Secondary | ICD-10-CM

## 2014-01-10 ENCOUNTER — Other Ambulatory Visit: Payer: Self-pay | Admitting: Cardiology

## 2014-02-17 ENCOUNTER — Other Ambulatory Visit: Payer: Self-pay | Admitting: Cardiology

## 2014-03-02 ENCOUNTER — Ambulatory Visit (INDEPENDENT_AMBULATORY_CARE_PROVIDER_SITE_OTHER): Payer: Medicare Other | Admitting: Pharmacist

## 2014-03-02 DIAGNOSIS — I4891 Unspecified atrial fibrillation: Secondary | ICD-10-CM

## 2014-03-02 DIAGNOSIS — Z7901 Long term (current) use of anticoagulants: Secondary | ICD-10-CM

## 2014-03-02 LAB — POCT INR: INR: 1.9

## 2014-03-25 ENCOUNTER — Other Ambulatory Visit: Payer: Self-pay | Admitting: Cardiology

## 2014-03-30 ENCOUNTER — Ambulatory Visit (INDEPENDENT_AMBULATORY_CARE_PROVIDER_SITE_OTHER): Payer: Medicare Other

## 2014-03-30 ENCOUNTER — Other Ambulatory Visit: Payer: Self-pay | Admitting: Cardiology

## 2014-03-30 DIAGNOSIS — I4891 Unspecified atrial fibrillation: Secondary | ICD-10-CM

## 2014-03-30 DIAGNOSIS — Z7901 Long term (current) use of anticoagulants: Secondary | ICD-10-CM

## 2014-03-30 LAB — POCT INR: INR: 2.7

## 2014-04-20 ENCOUNTER — Other Ambulatory Visit: Payer: Self-pay | Admitting: Cardiology

## 2014-05-04 ENCOUNTER — Ambulatory Visit (INDEPENDENT_AMBULATORY_CARE_PROVIDER_SITE_OTHER): Payer: Medicare Other | Admitting: Pharmacist Clinician (PhC)/ Clinical Pharmacy Specialist

## 2014-05-04 DIAGNOSIS — Z7901 Long term (current) use of anticoagulants: Secondary | ICD-10-CM

## 2014-05-04 DIAGNOSIS — I4891 Unspecified atrial fibrillation: Secondary | ICD-10-CM

## 2014-05-04 LAB — POCT INR: INR: 2.4

## 2014-06-03 ENCOUNTER — Other Ambulatory Visit: Payer: Self-pay | Admitting: Cardiology

## 2014-06-07 ENCOUNTER — Other Ambulatory Visit: Payer: Self-pay | Admitting: Cardiology

## 2014-06-13 ENCOUNTER — Other Ambulatory Visit: Payer: Self-pay | Admitting: Cardiology

## 2014-06-22 ENCOUNTER — Other Ambulatory Visit: Payer: Self-pay | Admitting: Cardiology

## 2014-06-22 ENCOUNTER — Ambulatory Visit (INDEPENDENT_AMBULATORY_CARE_PROVIDER_SITE_OTHER): Payer: Medicare Other | Admitting: *Deleted

## 2014-06-22 DIAGNOSIS — I4891 Unspecified atrial fibrillation: Secondary | ICD-10-CM

## 2014-06-22 DIAGNOSIS — Z7901 Long term (current) use of anticoagulants: Secondary | ICD-10-CM

## 2014-06-22 LAB — POCT INR: INR: 2.8

## 2014-07-16 ENCOUNTER — Ambulatory Visit (INDEPENDENT_AMBULATORY_CARE_PROVIDER_SITE_OTHER): Payer: Medicare Other | Admitting: Cardiology

## 2014-07-16 ENCOUNTER — Encounter: Payer: Self-pay | Admitting: Cardiology

## 2014-07-16 VITALS — BP 116/66 | HR 48 | Ht 72.0 in | Wt 181.4 lb

## 2014-07-16 DIAGNOSIS — I4891 Unspecified atrial fibrillation: Secondary | ICD-10-CM

## 2014-07-16 DIAGNOSIS — R001 Bradycardia, unspecified: Secondary | ICD-10-CM | POA: Insufficient documentation

## 2014-07-16 DIAGNOSIS — I498 Other specified cardiac arrhythmias: Secondary | ICD-10-CM

## 2014-07-16 DIAGNOSIS — Z7901 Long term (current) use of anticoagulants: Secondary | ICD-10-CM

## 2014-07-16 DIAGNOSIS — I119 Hypertensive heart disease without heart failure: Secondary | ICD-10-CM

## 2014-07-16 DIAGNOSIS — I482 Chronic atrial fibrillation, unspecified: Secondary | ICD-10-CM

## 2014-07-16 MED ORDER — ATENOLOL 25 MG PO TABS: 25.0000 mg | ORAL_TABLET | Freq: Every day | ORAL | Status: DC

## 2014-07-16 NOTE — Patient Instructions (Signed)
DECREASE YOUR ATENOLOL TO 25 MG DAILY  Your physician wants you to follow-up in: Ali Chukson will receive a reminder letter in the mail two months in advance. If you don't receive a letter, please call our office to schedule the follow-up appointment.

## 2014-07-16 NOTE — Assessment & Plan Note (Signed)
Blood pressure was remaining stable on current therapy 

## 2014-07-16 NOTE — Assessment & Plan Note (Signed)
The patient is in permanent atrial fibrillation on Coumadin.  He is not having any bleeding problems from the Coumadin.  He is followed in [facility] Coumadin clinic.  He denies any TIA symptoms.

## 2014-07-16 NOTE — Progress Notes (Signed)
Daniel Love Date of Birth:  Aug 12, 1940 [name] [address] [name], [name]  [address] [phone]        Fax   [phone]   History of Present Illness: This 74 year old gentleman is seen for a one-year followup office visit.  He has a history of permanent atrial fibrillation.  We initially saw him in December 2010 at which time he was in atrial fibrillation.  He underwent cardioversion on 01/27/10 successfully.  He was then lost to followup until 05/14/13 at which time he returned in atrial fibrillation.  Over that period of time he continued to get his prothrombin time faithfully.  He is asymptomatic in terms of his atrial fibrillation.  Not aware of his heart rate.  He does have occasional mild lightheaded spells which may be related to his bradycardia.  He's had a past history of hypertension  Current Outpatient Prescriptions  Medication Sig Dispense Refill  . aspirin 81 MG chewable tablet Chew 81 mg by mouth daily.        [name] atenolol (TENORMIN) 25 MG tablet Take 1 tablet (25 mg total) by mouth daily.  90 tablet  3  . BENICAR HCT 40-25 MG per tablet TAKE 1 TABLET BY MOUTH DAILY.  30 tablet  0  . Methylcellulose, Laxative, (CITRUCEL PO) Take by mouth as directed.        . Multiple Vitamins-Minerals (CENTRUM SILVER PO) Take 1 tablet by mouth every morning.        . nystatin cream (MYCOSTATIN)       . omeprazole (PRILOSEC) 40 MG capsule Take 40 mg by mouth daily.        [name] spironolactone (ALDACTONE) 25 MG tablet TAKE 1 TABLET (25 MG TOTAL) BY MOUTH DAILY.  90 tablet  0  . warfarin (COUMADIN) 5 MG tablet TAKE AS DIRECTED BY ANTICOAGULATION CLINIC  40 tablet  3   No current facility-administered medications for this visit.    Allergies  Allergen Reactions  . Amlodipine     Reaction-edema  . Lisinopril Cough    Patient Active Problem List   Diagnosis Date Noted  . Benign hypertensive heart disease without heart failure 05/14/2013  . Long term  (current) use of anticoagulants 10/10/2011  . Atrial fibrillation   . Atrial fibrillation     History  Smoking status  . Former Smoker  . Types: Cigarettes  . Quit date: 12/31/1966  Smokeless tobacco  . Never Used    History  Alcohol Use  . 0.0 oz/week    Comment: 1 daily     Family History  Problem Relation Age of Onset  . Heart failure Father   . Breast cancer Mother   . Cancer Father   . Colon cancer Maternal Grandmother     Review of Systems: Constitutional: no fever chills diaphoresis or fatigue or change in weight.  Head and neck: no hearing loss, no epistaxis, no photophobia or visual disturbance. Respiratory: No cough, shortness of breath or wheezing. Cardiovascular: No chest pain peripheral edema, palpitations. Gastrointestinal: No abdominal distention, no abdominal pain, no change in bowel habits hematochezia or melena. Genitourinary: No dysuria, no frequency, no urgency, no nocturia. Musculoskeletal:No arthralgias, no back pain, no gait disturbance or myalgias. Neurological: No dizziness, no headaches, no numbness, no seizures, no syncope, no weakness, no tremors. Hematologic: No lymphadenopathy, no easy bruising. Psychiatric: No confusion, no hallucinations, no sleep disturbance.    Physical Exam: Filed Vitals:   07/16/14 1514  BP: 116/66  Pulse: 48   general appearance reveals a well-developed well-nourished gentleman in no distress.The head and neck exam reveals pupils equal and reactive.  Extraocular movements are full.  There is no scleral icterus.  The mouth and pharynx are normal.  The neck is supple.  The carotids reveal no bruits.  The jugular venous pressure is normal.  The  thyroid is not enlarged.  There is no lymphadenopathy.  The chest is clear to percussion and auscultation.  There are no rales or rhonchi.  Expansion of the chest is symmetrical.  The precordium is quiet.  The pulse is irregularly irregular. The first heart sound is normal.  The  second heart sound is physiologically split.  There is no murmur gallop rub or click.  There is no abnormal lift or heave.  The abdomen is soft and nontender.  The bowel sounds are normal.  The liver and spleen are not enlarged.  There are no abdominal masses.  There are no abdominal bruits.  Extremities reveal good pedal pulses.  There is no phlebitis or edema.  There is no cyanosis or clubbing.  Strength is normal and symmetrical in all extremities.  There is no lateralizing weakness.  There are no sensory deficits.  The skin is warm and dry.  There is no rash.  EKG shows atrial fibrillation with a slow ventricular response of 48 per minute   Assessment / Plan: 1.  Atrial fibrillation with slow ventricular response 2. hypertensive heart disease without heart failure 3.  Occasional lightheaded episodes without syncope  Plan: Continue same medication except decrease his atenolol to 25 mg daily Recheck in one year for followup office visit and EKG. Continue regular checks at the Coumadin clinic

## 2014-07-22 ENCOUNTER — Other Ambulatory Visit: Payer: Self-pay | Admitting: Cardiology

## 2014-07-26 ENCOUNTER — Other Ambulatory Visit: Payer: Self-pay | Admitting: Cardiology

## 2014-08-04 ENCOUNTER — Ambulatory Visit (INDEPENDENT_AMBULATORY_CARE_PROVIDER_SITE_OTHER): Payer: Medicare Other | Admitting: Pharmacist

## 2014-08-04 DIAGNOSIS — Z7901 Long term (current) use of anticoagulants: Secondary | ICD-10-CM

## 2014-08-04 DIAGNOSIS — I4891 Unspecified atrial fibrillation: Secondary | ICD-10-CM

## 2014-08-04 LAB — POCT INR: INR: 2.6

## 2014-08-26 ENCOUNTER — Other Ambulatory Visit: Payer: Self-pay | Admitting: Cardiology

## 2014-09-14 ENCOUNTER — Ambulatory Visit (INDEPENDENT_AMBULATORY_CARE_PROVIDER_SITE_OTHER): Payer: Medicare Other | Admitting: *Deleted

## 2014-09-14 DIAGNOSIS — I4891 Unspecified atrial fibrillation: Secondary | ICD-10-CM

## 2014-09-14 DIAGNOSIS — Z7901 Long term (current) use of anticoagulants: Secondary | ICD-10-CM

## 2014-09-14 LAB — POCT INR: INR: 2.7

## 2014-10-26 ENCOUNTER — Ambulatory Visit (INDEPENDENT_AMBULATORY_CARE_PROVIDER_SITE_OTHER): Payer: Medicare Other | Admitting: Pharmacist

## 2014-10-26 DIAGNOSIS — Z7901 Long term (current) use of anticoagulants: Secondary | ICD-10-CM

## 2014-10-26 DIAGNOSIS — I4891 Unspecified atrial fibrillation: Secondary | ICD-10-CM

## 2014-10-26 LAB — POCT INR: INR: 2.8

## 2014-11-29 ENCOUNTER — Other Ambulatory Visit: Payer: Self-pay | Admitting: Cardiology

## 2014-12-08 ENCOUNTER — Ambulatory Visit (INDEPENDENT_AMBULATORY_CARE_PROVIDER_SITE_OTHER): Payer: Medicare Other | Admitting: Pharmacist

## 2014-12-08 DIAGNOSIS — Z7901 Long term (current) use of anticoagulants: Secondary | ICD-10-CM

## 2014-12-08 DIAGNOSIS — I4891 Unspecified atrial fibrillation: Secondary | ICD-10-CM

## 2014-12-08 LAB — POCT INR: INR: 4

## 2015-01-04 ENCOUNTER — Ambulatory Visit (INDEPENDENT_AMBULATORY_CARE_PROVIDER_SITE_OTHER): Payer: Medicare Other | Admitting: *Deleted

## 2015-01-04 DIAGNOSIS — Z7901 Long term (current) use of anticoagulants: Secondary | ICD-10-CM

## 2015-01-04 DIAGNOSIS — I4891 Unspecified atrial fibrillation: Secondary | ICD-10-CM

## 2015-01-04 LAB — POCT INR: INR: 2.9

## 2015-01-18 ENCOUNTER — Ambulatory Visit (INDEPENDENT_AMBULATORY_CARE_PROVIDER_SITE_OTHER): Payer: Medicare Other | Admitting: *Deleted

## 2015-01-18 DIAGNOSIS — I4891 Unspecified atrial fibrillation: Secondary | ICD-10-CM

## 2015-01-18 DIAGNOSIS — Z7901 Long term (current) use of anticoagulants: Secondary | ICD-10-CM

## 2015-01-18 LAB — POCT INR: INR: 1.9

## 2015-01-26 ENCOUNTER — Telehealth: Payer: Self-pay | Admitting: Cardiology

## 2015-01-26 NOTE — Telephone Encounter (Signed)
Advised wife, verbalized understanding.  

## 2015-01-26 NOTE — Telephone Encounter (Signed)
Wife wants to know if ok to take OTC [place] fish oil with his medications (patient on Warfarin)  Will forward to  Dr. Mare Ferrari for review

## 2015-01-26 NOTE — Telephone Encounter (Signed)
Since he is on the warfarin I would not take the fish oil.

## 2015-01-26 NOTE — Telephone Encounter (Signed)
New message    Pt c/o medication issue:  1. Name of Medication: fish oil - new medication from PCP   2. How are you currently taking this medication (dosage and times per day)? No  3. Are you having a reaction (difficulty breathing--STAT)? no  4. What is your medication issue? Wife has questions like to discuss.

## 2015-02-08 ENCOUNTER — Ambulatory Visit (INDEPENDENT_AMBULATORY_CARE_PROVIDER_SITE_OTHER): Payer: Medicare Other | Admitting: *Deleted

## 2015-02-08 DIAGNOSIS — Z7901 Long term (current) use of anticoagulants: Secondary | ICD-10-CM

## 2015-02-08 DIAGNOSIS — I4891 Unspecified atrial fibrillation: Secondary | ICD-10-CM

## 2015-02-08 LAB — POCT INR: INR: 2.1

## 2015-02-12 ENCOUNTER — Other Ambulatory Visit: Payer: Self-pay | Admitting: Cardiology

## 2015-03-04 ENCOUNTER — Other Ambulatory Visit: Payer: Self-pay | Admitting: Cardiology

## 2015-03-08 ENCOUNTER — Ambulatory Visit (INDEPENDENT_AMBULATORY_CARE_PROVIDER_SITE_OTHER): Payer: Medicare Other | Admitting: *Deleted

## 2015-03-08 DIAGNOSIS — Z7901 Long term (current) use of anticoagulants: Secondary | ICD-10-CM | POA: Diagnosis not present

## 2015-03-08 DIAGNOSIS — I4891 Unspecified atrial fibrillation: Secondary | ICD-10-CM | POA: Diagnosis not present

## 2015-03-08 LAB — POCT INR: INR: 2

## 2015-03-11 ENCOUNTER — Other Ambulatory Visit: Payer: Self-pay | Admitting: Cardiology

## 2015-03-22 ENCOUNTER — Other Ambulatory Visit: Payer: Self-pay | Admitting: Cardiology

## 2015-04-05 ENCOUNTER — Ambulatory Visit (INDEPENDENT_AMBULATORY_CARE_PROVIDER_SITE_OTHER): Payer: Medicare Other

## 2015-04-05 DIAGNOSIS — Z7901 Long term (current) use of anticoagulants: Secondary | ICD-10-CM | POA: Diagnosis not present

## 2015-04-05 DIAGNOSIS — I4891 Unspecified atrial fibrillation: Secondary | ICD-10-CM | POA: Diagnosis not present

## 2015-04-05 LAB — POCT INR: INR: 2.7

## 2015-04-18 ENCOUNTER — Other Ambulatory Visit: Payer: Self-pay | Admitting: Cardiology

## 2015-05-12 ENCOUNTER — Other Ambulatory Visit: Payer: Self-pay | Admitting: Cardiology

## 2015-05-17 ENCOUNTER — Ambulatory Visit (INDEPENDENT_AMBULATORY_CARE_PROVIDER_SITE_OTHER): Payer: Medicare Other | Admitting: *Deleted

## 2015-05-17 DIAGNOSIS — Z7901 Long term (current) use of anticoagulants: Secondary | ICD-10-CM | POA: Diagnosis not present

## 2015-05-17 DIAGNOSIS — I4891 Unspecified atrial fibrillation: Secondary | ICD-10-CM | POA: Diagnosis not present

## 2015-05-17 LAB — POCT INR: INR: 2.3

## 2015-06-02 ENCOUNTER — Other Ambulatory Visit: Payer: Self-pay | Admitting: Cardiology

## 2015-06-08 ENCOUNTER — Other Ambulatory Visit: Payer: Self-pay | Admitting: Cardiology

## 2015-06-28 ENCOUNTER — Ambulatory Visit (INDEPENDENT_AMBULATORY_CARE_PROVIDER_SITE_OTHER): Payer: Medicare Other

## 2015-06-28 DIAGNOSIS — I4891 Unspecified atrial fibrillation: Secondary | ICD-10-CM | POA: Diagnosis not present

## 2015-06-28 DIAGNOSIS — Z7901 Long term (current) use of anticoagulants: Secondary | ICD-10-CM | POA: Diagnosis not present

## 2015-06-28 LAB — POCT INR: INR: 2.1

## 2015-07-12 ENCOUNTER — Encounter: Payer: Self-pay | Admitting: Cardiology

## 2015-07-12 ENCOUNTER — Ambulatory Visit (INDEPENDENT_AMBULATORY_CARE_PROVIDER_SITE_OTHER): Payer: Medicare Other | Admitting: Cardiology

## 2015-07-12 VITALS — BP 100/60 | HR 61 | Ht 72.0 in | Wt 178.0 lb

## 2015-07-12 DIAGNOSIS — I4891 Unspecified atrial fibrillation: Secondary | ICD-10-CM | POA: Diagnosis not present

## 2015-07-12 NOTE — Progress Notes (Signed)
Cardiology Office Note   Date:  07/12/2015   ID:  Daniel Love, DOB [birth year], MRN [id number]  PCP:  Daniel Noon, MD  Cardiologist: Darlin Coco MD  Chief Complaint  Patient presents with  . Atrial Fibrillation      History of Present Illness: Daniel Love is a 75 y.o. male who presents for a one-year follow-up visit  This 75 year old gentleman is seen for a one-year followup office visit. He has a history of permanent atrial fibrillation. We initially saw him in December 2010 at which time he was in atrial fibrillation. He underwent cardioversion on 01/27/10 successfully. He was then lost to followup until 05/14/13 at which time he returned in atrial fibrillation. Over that period of time he continued to get his prothrombin time faithfully. He is asymptomatic in terms of his atrial fibrillation. Not aware of his heart rate. He does have occasional mild lightheaded spells which may be related to his bradycardia. He's had a past history of hypertension.  He denies any chest pain or shortness of breath.  Energy level is satisfactory.  He does not have any history of ischemic heart disease or TIA or stroke  Past Medical History  Diagnosis Date  . Hypertension   . Atrial fibrillation     maintaining normal sinus rhythm  . Dyspepsia   . Chronic anticoagulation     coumadin  . History of kidney stones   . Anal fissure     hx of   . History of colon polyps 2012    Colonoscopy-Dr. Benson Norway     Past Surgical History  Procedure Laterality Date  . Back surgery  12/31/1977    for degenerative disc disease     Current Outpatient Prescriptions  Medication Sig Dispense Refill  . atenolol (TENORMIN) 25 MG tablet Take 1 tablet (25 mg total) by mouth daily. 90 tablet 3  . BENICAR HCT 40-25 MG per tablet TAKE ONE TABLET BY MOUTH ONCE DAILY 30 tablet 2  . Methylcellulose, Laxative, (CITRUCEL PO) Take by mouth as directed.      . Multiple Vitamins-Minerals (CENTRUM  SILVER PO) Take 1 tablet by mouth every morning.      . nystatin cream (MYCOSTATIN) Apply 1 application topically as needed (RASH).     [name] omeprazole (PRILOSEC) 40 MG capsule Take 40 mg by mouth daily.      [name] spironolactone (ALDACTONE) 25 MG tablet TAKE 1 TABLET (25 MG TOTAL) BY MOUTH DAILY. 90 tablet 1  . warfarin (COUMADIN) 5 MG tablet TAKE AS DIRECTED BY COUMADIN CLINIC 40 tablet 3   No current facility-administered medications for this visit.    Allergies:   Amlodipine and Lisinopril    Social History:  The patient  reports that he quit smoking about 48 years ago. His smoking use included Cigarettes. He has never used smokeless tobacco. He reports that he drinks alcohol. He reports that he does not use illicit drugs.   Family History:  The patient's family history includes Breast cancer in his mother; Cancer in his father and mother; Colon cancer in his maternal grandmother; Heart attack in his father; Heart failure in his father. There is no history of Stroke.    ROS:  Please see the history of present illness.   Otherwise, review of systems are positive for none.   All other systems are reviewed and negative.    PHYSICAL EXAM: VS:  BP 100/60 mmHg  Pulse 61  Ht 6' (1.829 m)  Wt 178 lb (80.74  kg)  BMI 24.14 kg/m2 , BMI Body mass index is 24.14 kg/(m^2). GEN: Well nourished, well developed, in no acute distress HEENT: normal Neck: no JVD, carotid bruits, or masses Cardiac: Irregularly irregular.; no murmurs, rubs, or gallops,no edema  Respiratory:  clear to auscultation bilaterally, normal work of breathing GI: soft, nontender, nondistended, + BS MS: no deformity or atrophy Skin: warm and dry, no rash Neuro:  Strength and sensation are intact Psych: euthymic mood, full affect   EKG:  EKG is ordered today. The ekg ordered today demonstrates atrial fibrillation with ventricular response 61 bpm.  No ischemic changes.   Recent Labs: No results found for requested labs within  last 365 days.    Lipid Panel No results found for: CHOL, TRIG, HDL, CHOLHDL, VLDL, LDLCALC, LDLDIRECT    Wt Readings from Last 3 Encounters:  07/12/15 178 lb (80.74 kg)  07/16/14 181 lb 6.4 oz (82.283 kg)  05/14/13 188 lb 1.9 oz (85.331 kg)        ASSESSMENT AND PLAN:  1. Atrial fibrillation with slow ventricular response.  On long-term warfarin.  He comes into the [facility] Coumadin clinic about every 6 weeks 2. hypertensive heart disease without heart failure 3. Occasional lightheaded episodes without syncope  Plan: Continue same medication except he may stop his baby aspirin.  There is no benefit for combining aspirin with warfarin in his case, as noted in the AVERROES study, and it does increase the risk of bleeding. Recheck in one year for followup office visit and EKG. Continue regular checks at the Coumadin clinic   Current medicines are reviewed at length with the patient today.  The patient does not have concerns regarding medicines.  The following changes have been made:  no change  Labs/ tests ordered today include:   Orders Placed This Encounter  Procedures  . EKG 12-Lead    Disposition: Continue other medications the same.  Stop baby aspirin.  Recheck in one year for office visit and EKG  Signed, Darlin Coco MD 07/12/2015 11:06 AM    [address], [place], [place]  [address] Phone: [phone]; Fax: [phone]

## 2015-07-12 NOTE — Patient Instructions (Signed)
Medication Instructions:  STOP  YOUR ASPIRIN   Labwork: NONE  Testing/Procedures: NONE  Follow-Up: Your physician wants you to follow-up in: 1 [place] will receive a reminder letter in the mail two months in advance. If you don't receive a letter, please call our office to schedule the follow-up appointment.   Any Other Special Instructions Will Be Listed Below (If Applicable).

## 2015-07-14 ENCOUNTER — Encounter: Payer: Self-pay | Admitting: Gastroenterology

## 2015-08-08 ENCOUNTER — Other Ambulatory Visit: Payer: Self-pay | Admitting: Cardiology

## 2015-08-09 ENCOUNTER — Ambulatory Visit (INDEPENDENT_AMBULATORY_CARE_PROVIDER_SITE_OTHER): Payer: Medicare Other

## 2015-08-09 DIAGNOSIS — I4891 Unspecified atrial fibrillation: Secondary | ICD-10-CM

## 2015-08-09 DIAGNOSIS — Z7901 Long term (current) use of anticoagulants: Secondary | ICD-10-CM

## 2015-08-09 LAB — POCT INR: INR: 2

## 2015-08-28 ENCOUNTER — Other Ambulatory Visit: Payer: Self-pay | Admitting: Cardiology

## 2015-09-20 ENCOUNTER — Ambulatory Visit (INDEPENDENT_AMBULATORY_CARE_PROVIDER_SITE_OTHER): Payer: Medicare Other

## 2015-09-20 DIAGNOSIS — I4891 Unspecified atrial fibrillation: Secondary | ICD-10-CM | POA: Diagnosis not present

## 2015-09-20 DIAGNOSIS — Z7901 Long term (current) use of anticoagulants: Secondary | ICD-10-CM

## 2015-09-20 LAB — POCT INR: INR: 1.9

## 2015-11-01 ENCOUNTER — Ambulatory Visit (INDEPENDENT_AMBULATORY_CARE_PROVIDER_SITE_OTHER): Payer: Medicare Other

## 2015-11-01 DIAGNOSIS — I482 Chronic atrial fibrillation, unspecified: Secondary | ICD-10-CM

## 2015-11-01 DIAGNOSIS — Z7901 Long term (current) use of anticoagulants: Secondary | ICD-10-CM

## 2015-11-01 DIAGNOSIS — I4891 Unspecified atrial fibrillation: Secondary | ICD-10-CM | POA: Diagnosis not present

## 2015-11-01 LAB — POCT INR: INR: 2.7

## 2015-11-28 ENCOUNTER — Other Ambulatory Visit: Payer: Self-pay | Admitting: Cardiology

## 2015-11-29 ENCOUNTER — Other Ambulatory Visit: Payer: Self-pay | Admitting: Cardiology

## 2015-12-01 ENCOUNTER — Other Ambulatory Visit: Payer: Self-pay | Admitting: Cardiology

## 2015-12-03 ENCOUNTER — Other Ambulatory Visit: Payer: Self-pay | Admitting: Cardiology

## 2015-12-13 ENCOUNTER — Ambulatory Visit (INDEPENDENT_AMBULATORY_CARE_PROVIDER_SITE_OTHER): Payer: Medicare Other | Admitting: *Deleted

## 2015-12-13 DIAGNOSIS — I482 Chronic atrial fibrillation, unspecified: Secondary | ICD-10-CM

## 2015-12-13 DIAGNOSIS — I4891 Unspecified atrial fibrillation: Secondary | ICD-10-CM

## 2015-12-13 DIAGNOSIS — Z7901 Long term (current) use of anticoagulants: Secondary | ICD-10-CM | POA: Diagnosis not present

## 2015-12-13 LAB — POCT INR: INR: 3.2

## 2016-01-03 ENCOUNTER — Ambulatory Visit (INDEPENDENT_AMBULATORY_CARE_PROVIDER_SITE_OTHER): Payer: Medicare Other | Admitting: *Deleted

## 2016-01-03 DIAGNOSIS — Z7901 Long term (current) use of anticoagulants: Secondary | ICD-10-CM

## 2016-01-03 DIAGNOSIS — I4891 Unspecified atrial fibrillation: Secondary | ICD-10-CM

## 2016-01-03 DIAGNOSIS — I482 Chronic atrial fibrillation, unspecified: Secondary | ICD-10-CM

## 2016-01-03 LAB — POCT INR: INR: 1.7

## 2016-01-05 ENCOUNTER — Other Ambulatory Visit: Payer: Self-pay | Admitting: Cardiology

## 2016-01-12 DIAGNOSIS — H401131 Primary open-angle glaucoma, bilateral, mild stage: Secondary | ICD-10-CM | POA: Diagnosis not present

## 2016-01-17 ENCOUNTER — Ambulatory Visit (INDEPENDENT_AMBULATORY_CARE_PROVIDER_SITE_OTHER): Payer: Medicare Other

## 2016-01-17 DIAGNOSIS — I482 Chronic atrial fibrillation, unspecified: Secondary | ICD-10-CM

## 2016-01-17 DIAGNOSIS — Z7901 Long term (current) use of anticoagulants: Secondary | ICD-10-CM

## 2016-01-17 DIAGNOSIS — I4891 Unspecified atrial fibrillation: Secondary | ICD-10-CM | POA: Diagnosis not present

## 2016-01-17 LAB — POCT INR: INR: 2.4

## 2016-02-01 ENCOUNTER — Other Ambulatory Visit: Payer: Self-pay | Admitting: Cardiology

## 2016-02-02 ENCOUNTER — Other Ambulatory Visit: Payer: Self-pay | Admitting: *Deleted

## 2016-02-07 ENCOUNTER — Ambulatory Visit (INDEPENDENT_AMBULATORY_CARE_PROVIDER_SITE_OTHER): Payer: Medicare Other | Admitting: *Deleted

## 2016-02-07 DIAGNOSIS — I4891 Unspecified atrial fibrillation: Secondary | ICD-10-CM

## 2016-02-07 DIAGNOSIS — Z7901 Long term (current) use of anticoagulants: Secondary | ICD-10-CM

## 2016-02-07 DIAGNOSIS — I482 Chronic atrial fibrillation, unspecified: Secondary | ICD-10-CM

## 2016-02-07 LAB — POCT INR: INR: 2.8

## 2016-03-05 ENCOUNTER — Other Ambulatory Visit: Payer: Self-pay | Admitting: Cardiology

## 2016-03-06 ENCOUNTER — Ambulatory Visit (INDEPENDENT_AMBULATORY_CARE_PROVIDER_SITE_OTHER): Payer: Medicare Other | Admitting: *Deleted

## 2016-03-06 DIAGNOSIS — I4891 Unspecified atrial fibrillation: Secondary | ICD-10-CM | POA: Diagnosis not present

## 2016-03-06 DIAGNOSIS — I482 Chronic atrial fibrillation, unspecified: Secondary | ICD-10-CM

## 2016-03-06 DIAGNOSIS — Z7901 Long term (current) use of anticoagulants: Secondary | ICD-10-CM | POA: Diagnosis not present

## 2016-03-06 LAB — POCT INR: INR: 2.4

## 2016-04-03 ENCOUNTER — Other Ambulatory Visit: Payer: Self-pay | Admitting: Cardiology

## 2016-04-05 DIAGNOSIS — H401131 Primary open-angle glaucoma, bilateral, mild stage: Secondary | ICD-10-CM | POA: Diagnosis not present

## 2016-04-17 ENCOUNTER — Ambulatory Visit (INDEPENDENT_AMBULATORY_CARE_PROVIDER_SITE_OTHER): Payer: Medicare Other | Admitting: Pharmacist

## 2016-04-17 DIAGNOSIS — I4891 Unspecified atrial fibrillation: Secondary | ICD-10-CM | POA: Diagnosis not present

## 2016-04-17 DIAGNOSIS — Z7901 Long term (current) use of anticoagulants: Secondary | ICD-10-CM

## 2016-04-17 DIAGNOSIS — I482 Chronic atrial fibrillation, unspecified: Secondary | ICD-10-CM

## 2016-04-17 LAB — POCT INR: INR: 2.6

## 2016-05-15 DIAGNOSIS — R972 Elevated prostate specific antigen [PSA]: Secondary | ICD-10-CM | POA: Diagnosis not present

## 2016-05-29 ENCOUNTER — Ambulatory Visit (INDEPENDENT_AMBULATORY_CARE_PROVIDER_SITE_OTHER): Payer: Medicare Other

## 2016-05-29 DIAGNOSIS — I4891 Unspecified atrial fibrillation: Secondary | ICD-10-CM | POA: Diagnosis not present

## 2016-05-29 DIAGNOSIS — I482 Chronic atrial fibrillation, unspecified: Secondary | ICD-10-CM

## 2016-05-29 DIAGNOSIS — Z7901 Long term (current) use of anticoagulants: Secondary | ICD-10-CM | POA: Diagnosis not present

## 2016-05-29 LAB — POCT INR: INR: 1.3

## 2016-06-06 DIAGNOSIS — N644 Mastodynia: Secondary | ICD-10-CM | POA: Diagnosis not present

## 2016-06-12 ENCOUNTER — Ambulatory Visit (INDEPENDENT_AMBULATORY_CARE_PROVIDER_SITE_OTHER): Payer: Medicare Other | Admitting: *Deleted

## 2016-06-12 DIAGNOSIS — I482 Chronic atrial fibrillation, unspecified: Secondary | ICD-10-CM

## 2016-06-12 DIAGNOSIS — I4891 Unspecified atrial fibrillation: Secondary | ICD-10-CM | POA: Diagnosis not present

## 2016-06-12 DIAGNOSIS — Z7901 Long term (current) use of anticoagulants: Secondary | ICD-10-CM

## 2016-06-12 LAB — POCT INR: INR: 2.9

## 2016-06-13 DIAGNOSIS — H4010X0 Unspecified open-angle glaucoma, stage unspecified: Secondary | ICD-10-CM | POA: Diagnosis not present

## 2016-06-15 ENCOUNTER — Ambulatory Visit: Payer: Medicare Other | Admitting: Gastroenterology

## 2016-06-19 ENCOUNTER — Ambulatory Visit: Payer: Medicare Other | Admitting: Gastroenterology

## 2016-06-25 ENCOUNTER — Ambulatory Visit: Payer: Medicare Other | Admitting: Cardiovascular Disease

## 2016-06-29 ENCOUNTER — Other Ambulatory Visit: Payer: Self-pay | Admitting: *Deleted

## 2016-06-29 MED ORDER — WARFARIN SODIUM 5 MG PO TABS: ORAL_TABLET | ORAL | Status: DC

## 2016-07-10 DIAGNOSIS — R972 Elevated prostate specific antigen [PSA]: Secondary | ICD-10-CM | POA: Diagnosis not present

## 2016-07-11 ENCOUNTER — Encounter: Payer: Medicare Other | Admitting: Cardiovascular Disease

## 2016-07-17 DIAGNOSIS — L570 Actinic keratosis: Secondary | ICD-10-CM | POA: Diagnosis not present

## 2016-07-17 DIAGNOSIS — D485 Neoplasm of uncertain behavior of skin: Secondary | ICD-10-CM | POA: Diagnosis not present

## 2016-07-17 DIAGNOSIS — C44619 Basal cell carcinoma of skin of left upper limb, including shoulder: Secondary | ICD-10-CM | POA: Diagnosis not present

## 2016-07-17 DIAGNOSIS — L821 Other seborrheic keratosis: Secondary | ICD-10-CM | POA: Diagnosis not present

## 2016-07-17 DIAGNOSIS — L812 Freckles: Secondary | ICD-10-CM | POA: Diagnosis not present

## 2016-07-17 DIAGNOSIS — Z85828 Personal history of other malignant neoplasm of skin: Secondary | ICD-10-CM | POA: Diagnosis not present

## 2016-07-17 DIAGNOSIS — D1801 Hemangioma of skin and subcutaneous tissue: Secondary | ICD-10-CM | POA: Diagnosis not present

## 2016-07-20 ENCOUNTER — Encounter: Payer: Self-pay | Admitting: Cardiovascular Disease

## 2016-07-20 ENCOUNTER — Ambulatory Visit (INDEPENDENT_AMBULATORY_CARE_PROVIDER_SITE_OTHER): Payer: Medicare Other | Admitting: *Deleted

## 2016-07-20 ENCOUNTER — Ambulatory Visit (INDEPENDENT_AMBULATORY_CARE_PROVIDER_SITE_OTHER): Payer: Medicare Other | Admitting: Cardiovascular Disease

## 2016-07-20 VITALS — BP 110/70 | HR 44 | Ht 70.0 in | Wt 176.8 lb

## 2016-07-20 DIAGNOSIS — I482 Chronic atrial fibrillation, unspecified: Secondary | ICD-10-CM

## 2016-07-20 DIAGNOSIS — Z7901 Long term (current) use of anticoagulants: Secondary | ICD-10-CM

## 2016-07-20 DIAGNOSIS — I10 Essential (primary) hypertension: Secondary | ICD-10-CM

## 2016-07-20 DIAGNOSIS — I4891 Unspecified atrial fibrillation: Secondary | ICD-10-CM | POA: Diagnosis not present

## 2016-07-20 LAB — POCT INR: INR: 2

## 2016-07-20 NOTE — Progress Notes (Signed)
Cardiology Office Note   Date:  07/20/2016   ID:  Daniel Love, DOB November 11, 1940, MRN [id number]  PCP:  Daniel Noon, MD  Cardiologist: Daniel Coco MD  Chief Complaint  Patient presents with  . Follow-up    atrial fib , HTN   Problem list 1. Chronic atrial fibrillation 2. Essential hypertension 3.   July 12, 2015 - notes from Daniel Love is a 76 y.o. male who presents for a one-year follow-up visit  This 76 year old gentleman is seen for a one-year followup office visit. He has a history of permanent atrial fibrillation. We initially saw him in December 2010 at which time he was in atrial fibrillation. He underwent cardioversion on 01/27/10 successfully. He was then lost to followup until 05/14/13 at which time he returned in atrial fibrillation. Over that period of time he continued to get his prothrombin time faithfully. He is asymptomatic in terms of his atrial fibrillation. Not aware of his heart rate. He does have occasional mild lightheaded spells which may be related to his bradycardia. He's had a past history of hypertension.  He denies any chest pain or shortness of breath.  Energy level is satisfactory.  He does not have any history of ischemic heart disease or TIA or stroke  July 20, 2016:  Daniel Love is seen today  Has chronic atrial fib with slow HR  No syncope or presyncope   Past Medical History  Diagnosis Date  . Hypertension   . Atrial fibrillation (HCC)     maintaining normal sinus rhythm  . Dyspepsia   . Chronic anticoagulation     coumadin  . History of kidney stones   . Anal fissure     hx of   . History of colon polyps 2012    Colonoscopy-Dr. Benson Love     Past Surgical History  Procedure Laterality Date  . Back surgery  12/31/1977    for degenerative disc disease     Current Outpatient Prescriptions  Medication Sig Dispense Refill  . atenolol (TENORMIN) 25 MG tablet Take 1 tablet (25 mg total) by mouth daily. 90  tablet 3  . atenolol (TENORMIN) 50 MG tablet TAKE 1 TABLET (50 MG TOTAL) BY MOUTH DAILY. 90 tablet 1  . BENICAR HCT 40-25 MG per tablet TAKE ONE TABLET BY MOUTH ONCE DAILY 30 tablet 10  . LUMIGAN 0.01 % SOLN Place 1 drop into both eyes daily.    . Methylcellulose, Laxative, (CITRUCEL PO) Take by mouth as needed (for constipation).     . Multiple Vitamins-Minerals (CENTRUM SILVER PO) Take 1 tablet by mouth every morning.      . nystatin cream (MYCOSTATIN) Apply 1 application topically as needed (RASH).     [name] omeprazole (PRILOSEC) 40 MG capsule Take 40 mg by mouth daily.      [name] spironolactone (ALDACTONE) 25 MG tablet TAKE 1 TABLET (25 MG TOTAL) BY MOUTH DAILY. 90 tablet 0  . warfarin (COUMADIN) 5 MG tablet Take as directed by Coumadin Clinic. 40 tablet 3   No current facility-administered medications for this visit.    Allergies:   Amlodipine and Lisinopril    Social History:  The patient  reports that he quit smoking about 49 years ago. His smoking use included Cigarettes. He has never used smokeless tobacco. He reports that he drinks alcohol. He reports that he does not use illicit drugs.   Family History:  The patient's family history includes Breast cancer in his mother; Cancer in his  father and mother; Colon cancer in his maternal grandmother; Heart attack in his father; Heart failure in his father. There is no history of Stroke.    ROS:  Please see the history of present illness.   Otherwise, review of systems are positive for none.   All other systems are reviewed and negative.    PHYSICAL EXAM: VS:  BP 110/70 mmHg  Pulse 44  Ht 5\' 10"  (1.778 m)  Wt 176 lb 12.8 oz (80.196 kg)  BMI 25.37 kg/m2  SpO2 98% , BMI Body mass index is 25.37 kg/(m^2). GEN: Well nourished, well developed, in no acute distress HEENT: normal Neck: no JVD, carotid bruits, or masses Cardiac: Irregularly irregular.; no murmurs, rubs, or gallops,no edema  Respiratory:  clear to auscultation bilaterally,  normal work of breathing GI: soft, nontender, nondistended, + BS MS: no deformity or atrophy Skin: warm and dry, no rash Neuro:  Strength and sensation are intact Psych: euthymic mood, full affect   EKG:  EKG is ordered today. The ekg ordered today demonstrates atrial fibrillation with ventricular response 44.  No ischemic changes.   Recent Labs: No results found for requested labs within last 365 days.    Lipid Panel No results found for: CHOL, TRIG, HDL, CHOLHDL, VLDL, LDLCALC, LDLDIRECT    Wt Readings from Last 3 Encounters:  07/20/16 176 lb 12.8 oz (80.196 kg)  07/12/15 178 lb (80.74 kg)  07/16/14 181 lb 6.4 oz (82.283 kg)        ASSESSMENT AND PLAN:  1. Atrial fibrillation:    On long-term warfarin.   Ventricular rate is slow.  Will DC atenolol.    I called the patient at home and he was taking 50 mg a day  Continue rate control and anticoagulation . He will continue to take his HR and BP regularly   He has a prostate biopsy scheduled for the in the August. He would like to try Xarelto or Eliquis instead of the Coumadin. He will be holding the Coumadin in preparation for his biopsy. We will plan on starting him on Xarelto 20 mg a day after his prostate Bx.   2. Essential HTN: BP is well controlled    Mertie Moores, MD  07/20/2016 9:19 AM    [facility] [address]  [place], [place]  [address] Pager [phone] Phone: [phone]; Fax: [phone]

## 2016-07-20 NOTE — Patient Instructions (Addendum)
Medication Instructions:  Please call Sharyn Lull with your medications   Labwork: None Ordered   Testing/Procedures: None Ordered   Follow-Up: Your physician wants you to follow-up in: 1 year with Dr. Acie Fredrickson.  You will receive a reminder letter in the mail two months in advance. If you don't receive a letter, please call our office to schedule the follow-up appointment.   If you need a refill on your cardiac medications before your next appointment, please call your pharmacy.   Thank you for choosing [facility]! Christen Bame, RN [phone]

## 2016-07-23 ENCOUNTER — Other Ambulatory Visit: Payer: Self-pay | Admitting: Family Medicine

## 2016-07-23 DIAGNOSIS — N644 Mastodynia: Secondary | ICD-10-CM

## 2016-07-26 ENCOUNTER — Ambulatory Visit
Admission: RE | Admit: 2016-07-26 | Discharge: 2016-07-26 | Disposition: A | Discharge status: Home / Self Care | Payer: Medicare Other | Source: Ambulatory Visit | Attending: Family Medicine | Admitting: Family Medicine

## 2016-07-26 DIAGNOSIS — N62 Hypertrophy of breast: Secondary | ICD-10-CM | POA: Diagnosis not present

## 2016-07-26 DIAGNOSIS — N644 Mastodynia: Secondary | ICD-10-CM

## 2016-08-04 ENCOUNTER — Other Ambulatory Visit: Payer: Self-pay | Admitting: Cardiovascular Disease

## 2016-08-21 ENCOUNTER — Ambulatory Visit (INDEPENDENT_AMBULATORY_CARE_PROVIDER_SITE_OTHER): Payer: Medicare Other | Admitting: *Deleted

## 2016-08-21 DIAGNOSIS — Z7901 Long term (current) use of anticoagulants: Secondary | ICD-10-CM

## 2016-08-21 DIAGNOSIS — I482 Chronic atrial fibrillation, unspecified: Secondary | ICD-10-CM

## 2016-08-21 DIAGNOSIS — I4891 Unspecified atrial fibrillation: Secondary | ICD-10-CM | POA: Diagnosis not present

## 2016-08-21 LAB — POCT INR: INR: 2.4

## 2016-08-28 DIAGNOSIS — R972 Elevated prostate specific antigen [PSA]: Secondary | ICD-10-CM | POA: Diagnosis not present

## 2016-09-04 ENCOUNTER — Other Ambulatory Visit: Payer: Self-pay | Admitting: *Deleted

## 2016-09-04 MED ORDER — OLMESARTAN MEDOXOMIL-HCTZ 40-25 MG PO TABS: 1.0000 | ORAL_TABLET | Freq: Every day | ORAL | 2 refills | Status: DC

## 2016-09-05 ENCOUNTER — Telehealth: Payer: Self-pay

## 2016-09-05 ENCOUNTER — Ambulatory Visit (INDEPENDENT_AMBULATORY_CARE_PROVIDER_SITE_OTHER): Payer: Medicare Other | Admitting: Gastroenterology

## 2016-09-05 ENCOUNTER — Encounter: Payer: Self-pay | Admitting: Gastroenterology

## 2016-09-05 VITALS — BP 110/78 | HR 68 | Ht 70.0 in | Wt 177.6 lb

## 2016-09-05 DIAGNOSIS — R1310 Dysphagia, unspecified: Secondary | ICD-10-CM | POA: Diagnosis not present

## 2016-09-05 DIAGNOSIS — K219 Gastro-esophageal reflux disease without esophagitis: Secondary | ICD-10-CM

## 2016-09-05 DIAGNOSIS — Z1211 Encounter for screening for malignant neoplasm of colon: Secondary | ICD-10-CM | POA: Diagnosis not present

## 2016-09-05 MED ORDER — NA SULFATE-K SULFATE-MG SULF 17.5-3.13-1.6 GM/177ML PO SOLN: 1.0000 | Freq: Once | ORAL | 0 refills | Status: AC

## 2016-09-05 NOTE — Telephone Encounter (Signed)
09/05/2016   RE: Daniel Love DOB: [birth year] MRN: [id number]   Dear Dr Acie Fredrickson,    We have scheduled the above patient for an endoscopic procedure. Our records show that he is on anticoagulation therapy.   Please advise as to how long the patient may come off his therapy of coumadin prior to the procedure, which is scheduled for 10/02/16.  Please fax back/ or route the completed form to Latise Dilley at [phone].   Sincerely,    Christian Mate RN

## 2016-09-05 NOTE — Telephone Encounter (Signed)
Daniel Love may hold his coumadin for 5 days prior to procedure .

## 2016-09-05 NOTE — Patient Instructions (Addendum)
You will be set up for a colonoscopy. We will communicate with your cardiologist about the safety of holding your coumadin for 5 days prior to the colonoscopy (Dr. Acie Fredrickson). You will be set up for an upper endoscopy for dysphagia.

## 2016-09-05 NOTE — Progress Notes (Signed)
HPI: This is a    Very pleasant 76 year old man  .  Chief complaint is  Personal history of colon polyps, intermittent GERD, pyrosis and dysphagia    he had Colonoscopy with Dr. Carol Ada August 2011 for "family history of colon cancer". He was found to have 2 subcentimeter polyps. One of these was proven to be an adenomatous polyp on pathology. Also large hemorrhoids were noted.  He is on coumadin for atrial fibrillation.  Recent note by his cardiologist reviewed; he may be switching to xarelto soon.  His grandmother had colon cancer.  A cousin had colon cancer.  No real GI issues except sporadic minor constipation.  Overall his weight has been down 30 pounds in 5 years.  He has prostate biopsy last week, no cancer.  He has minor dysphagia.  He is not on antiacid medicines usually;  Takes a tums with good relief.  Will have pyrosis at times, usually after red wine or overindulging.  Probably bothered every other week or so.   She does not take any antiacid medicines on a routine basis  Review of systems: Pertinent positive and negative review of systems were noted in the above HPI section. Complete review of systems was performed and was otherwise normal.   Past Medical History:  Diagnosis Date  . Anal fissure    hx of   . Atrial fibrillation (HCC)    maintaining normal sinus rhythm  . Chronic anticoagulation    coumadin  . Dyspepsia   . History of colon polyps 2012   Colonoscopy-Dr. Benson Norway   . History of kidney stones   . Hypertension     Past Surgical History:  Procedure Laterality Date  . BACK SURGERY  12/31/1977   for degenerative disc disease  . PROSTATE BIOPSY      Current Outpatient Prescriptions  Medication Sig Dispense Refill  . colchicine 0.6 MG tablet as needed.    [name] LUMIGAN 0.01 % SOLN Place 1 drop into both eyes daily.    . Methylcellulose, Laxative, (CITRUCEL PO) Take by mouth as needed (for constipation).     . Multiple Vitamins-Minerals (CENTRUM  SILVER PO) Take 1 tablet by mouth every morning.      . nystatin cream (MYCOSTATIN) Apply 1 application topically as needed (RASH).     [name] olmesartan-hydrochlorothiazide (BENICAR HCT) 40-25 MG tablet Take 1 tablet by mouth daily. 90 tablet 2  . omeprazole (PRILOSEC) 40 MG capsule Take 40 mg by mouth daily.      [name] spironolactone (ALDACTONE) 25 MG tablet TAKE ONE TABLET BY MOUTH DAILY 90 tablet 3  . warfarin (COUMADIN) 5 MG tablet Take as directed by Coumadin Clinic. 40 tablet 3   No current facility-administered medications for this visit.     Allergies as of 09/05/2016 - Review Complete 09/05/2016  Allergen Reaction Noted  . Amlodipine  05/31/2011  . Lisinopril Cough 05/31/2011    Family History  Problem Relation Age of Onset  . Heart failure Father   . Cancer Father   . Heart attack Father   . Breast cancer Mother   . Cancer Mother   . Colon cancer Maternal Grandmother   . Stroke Neg Hx     Social History   Social History  . Marital status: Single    Spouse name: N/A  . Number of children: 3  . Years of education: N/A   Occupational History  . Retired    Social History Main Topics  . Smoking status: Former Smoker  Types: Cigarettes    Quit date: 12/31/1966  . Smokeless tobacco: Never Used  . Alcohol use 0.0 oz/week     Comment: 1 daily   . Drug use: No  . Sexual activity: Not on file   Other Topics Concern  . Not on file   Social History Narrative   Daily caffeine     Physical Exam: BP 110/78 (BP Location: Left Arm, Patient Position: Sitting, Cuff Size: Normal)   Pulse 68   Ht 5\' 10"  (1.778 m)   Wt 177 lb 9.6 oz (80.6 kg)   BMI 25.48 kg/m  Constitutional: generally well-appearing Psychiatric: alert and oriented x3 Eyes: extraocular movements intact Mouth: oral pharynx moist, no lesions Neck: supple no lymphadenopathy Cardiovascular: heart regular rate and rhythm Lungs: clear to auscultation bilaterally Abdomen: soft, nontender, nondistended, no  obvious ascites, no peritoneal signs, normal bowel sounds Extremities: no lower extremity edema bilaterally Skin: no lesions on visible extremities   Assessment and plan: 76 y.o. male with   Intermittent GERD, intermittent dysphagia to solids only, personal history of colon polyps   had a low risk adenoma removed 6 years ago I recommended we repeat colonoscopy at this point for surveillance. He is on Coumadin for atrial fibrillation and understands that that will have to be held for 5 days prior. He would be at increased risk for blood clotting, stroke during this time and we will make sure that his cardiologist is okay with [place] holding that medicine. I expect will be since he does tell that for prostate biopsy last week. He does have mild intermittent  Solid food dysphagia and also mild intermittent pyrosis.   Likely dysphagia is  Acid related. I recommended we repeat EGD at same time as his colonoscopy.   Owens Loffler, MD [facility] 09/05/2016, 11:10 AM  Cc: Chesley Noon, MD

## 2016-09-06 ENCOUNTER — Ambulatory Visit (INDEPENDENT_AMBULATORY_CARE_PROVIDER_SITE_OTHER): Payer: Medicare Other | Admitting: *Deleted

## 2016-09-06 DIAGNOSIS — I482 Chronic atrial fibrillation, unspecified: Secondary | ICD-10-CM

## 2016-09-06 DIAGNOSIS — I4891 Unspecified atrial fibrillation: Secondary | ICD-10-CM

## 2016-09-06 DIAGNOSIS — Z7901 Long term (current) use of anticoagulants: Secondary | ICD-10-CM | POA: Diagnosis not present

## 2016-09-06 LAB — POCT INR: INR: 1.4

## 2016-09-06 NOTE — Telephone Encounter (Signed)
The pt has been notified to stop coumadin 5 days prior to procedure

## 2016-09-06 NOTE — Telephone Encounter (Signed)
Left message on machine to call back  

## 2016-09-18 ENCOUNTER — Ambulatory Visit (INDEPENDENT_AMBULATORY_CARE_PROVIDER_SITE_OTHER): Payer: Medicare Other | Admitting: Pharmacist

## 2016-09-18 DIAGNOSIS — I4891 Unspecified atrial fibrillation: Secondary | ICD-10-CM | POA: Diagnosis not present

## 2016-09-18 DIAGNOSIS — Z7901 Long term (current) use of anticoagulants: Secondary | ICD-10-CM | POA: Diagnosis not present

## 2016-09-18 LAB — POCT INR: INR: 2.4

## 2016-09-27 DIAGNOSIS — Z23 Encounter for immunization: Secondary | ICD-10-CM | POA: Diagnosis not present

## 2016-09-28 ENCOUNTER — Telehealth: Payer: Self-pay | Admitting: Gastroenterology

## 2016-09-28 NOTE — Telephone Encounter (Signed)
Pt questions regarding what he can and cant eat were answered.  He will call back with any further concerns

## 2016-10-02 ENCOUNTER — Ambulatory Visit (AMBULATORY_SURGERY_CENTER): Payer: Medicare Other | Admitting: Gastroenterology

## 2016-10-02 ENCOUNTER — Encounter: Payer: Self-pay | Admitting: Gastroenterology

## 2016-10-02 VITALS — BP 104/70 | HR 88 | Temp 98.0°F | Resp 17 | Ht 70.0 in | Wt 177.0 lb

## 2016-10-02 DIAGNOSIS — K449 Diaphragmatic hernia without obstruction or gangrene: Secondary | ICD-10-CM | POA: Diagnosis not present

## 2016-10-02 DIAGNOSIS — Z86010 Personal history of colonic polyps: Secondary | ICD-10-CM

## 2016-10-02 DIAGNOSIS — Z1211 Encounter for screening for malignant neoplasm of colon: Secondary | ICD-10-CM | POA: Diagnosis not present

## 2016-10-02 DIAGNOSIS — K2990 Gastroduodenitis, unspecified, without bleeding: Secondary | ICD-10-CM | POA: Diagnosis not present

## 2016-10-02 DIAGNOSIS — K2950 Unspecified chronic gastritis without bleeding: Secondary | ICD-10-CM | POA: Diagnosis not present

## 2016-10-02 DIAGNOSIS — R1310 Dysphagia, unspecified: Secondary | ICD-10-CM

## 2016-10-02 DIAGNOSIS — K2970 Gastritis, unspecified, without bleeding: Secondary | ICD-10-CM | POA: Diagnosis not present

## 2016-10-02 DIAGNOSIS — R1319 Other dysphagia: Secondary | ICD-10-CM

## 2016-10-02 DIAGNOSIS — I4891 Unspecified atrial fibrillation: Secondary | ICD-10-CM | POA: Diagnosis not present

## 2016-10-02 MED ORDER — SODIUM CHLORIDE 0.9 % IV SOLN: 500.0000 mL | INTRAVENOUS | Status: DC

## 2016-10-02 NOTE — Patient Instructions (Signed)
YOU HAD AN ENDOSCOPIC PROCEDURE TODAY AT [facility]:   Refer to the procedure report that was given to you for any specific questions about what was found during the examination.  If the procedure report does not answer your questions, please call your gastroenterologist to clarify.  If you requested that your care partner not be given the details of your procedure findings, then the procedure report has been included in a sealed envelope for you to review at your convenience later.  YOU SHOULD EXPECT: Some feelings of bloating in the abdomen. Passage of more gas than usual.  Walking can help get rid of the air that was put into your GI tract during the procedure and reduce the bloating. If you had a lower endoscopy (such as a colonoscopy or flexible sigmoidoscopy) you may notice spotting of blood in your stool or on the toilet paper. If you underwent a bowel prep for your procedure, you may not have a normal bowel movement for a few days.  Please Note:  You might notice some irritation and congestion in your nose or some drainage.  This is from the oxygen used during your procedure.  There is no need for concern and it should clear up in a day or so.  SYMPTOMS TO REPORT IMMEDIATELY:   Following lower endoscopy (colonoscopy or flexible sigmoidoscopy):  Excessive amounts of blood in the stool  Significant tenderness or worsening of abdominal pains  Swelling of the abdomen that is new, acute  Fever of 100F or higher   Following upper endoscopy (EGD)  Vomiting of blood or coffee ground material  New chest pain or pain under the shoulder blades  Painful or persistently difficult swallowing  New shortness of breath  Fever of 100F or higher  Black, tarry-looking stools  For urgent or emergent issues, a gastroenterologist can be reached at any hour by calling [phone].   DIET:  We do recommend a small meal at first, but then you may proceed to your regular diet.  Drink  plenty of fluids but you should avoid alcoholic beverages for 24 hours.  ACTIVITY:  You should plan to take it easy for the rest of today and you should NOT DRIVE or use heavy machinery until tomorrow (because of the sedation medicines used during the test).    FOLLOW UP: Our staff will call the number listed on your records the next business day following your procedure to check on you and address any questions or concerns that you may have regarding the information given to you following your procedure. If we do not reach you, we will leave a message.  However, if you are feeling well and you are not experiencing any problems, there is no need to return our call.  We will assume that you have returned to your regular daily activities without incident.  If any biopsies were taken you will be contacted by phone or by letter within the next 1-3 weeks.  Please call us at [phone] if you have not heard about the biopsies in 3 weeks.    SIGNATURES/CONFIDENTIALITY: You and/or your care partner have signed paperwork which will be entered into your electronic medical record.  These signatures attest to the fact that that the information above on your After Visit Summary has been reviewed and is understood.  Full responsibility of the confidentiality of this discharge information lies with you and/or your care-partner.    Resume Coumadin and remainder of medications. Information given on  hemorrhoids and gastritis.

## 2016-10-02 NOTE — Op Note (Signed)
[place] Patient Name: Daniel Love Procedure Date: 10/02/2016 3:24 PM MRN: [id number] Endoscopist: Milus Banister , MD Age: 76 Referring MD:  Date of Birth: [birth year] Gender: Male Account #: [id number] Procedure:                Upper GI endoscopy Indications:              Dysphagia Medicines:                Monitored Anesthesia Care Procedure:                Pre-Anesthesia Assessment:                           - Prior to the procedure, a History and Physical                            was performed, and patient medications and                            allergies were reviewed. The patient's tolerance of                            previous anesthesia was also reviewed. The risks                            and benefits of the procedure and the sedation                            options and risks were discussed with the patient.                            All questions were answered, and informed consent                            was obtained. Prior Anticoagulants: The patient has                            taken Coumadin (warfarin), last dose was 5 days                            prior to procedure. ASA Grade Assessment: II - A                            patient with mild systemic disease. After reviewing                            the risks and benefits, the patient was deemed in                            satisfactory condition to undergo the procedure.                           After obtaining informed consent, the endoscope was  passed under direct vision. Throughout the                            procedure, the patient's blood pressure, pulse, and                            oxygen saturations were monitored continuously. The                            Model GIF-HQ190 [id number]) scope was introduced                            through the mouth, and advanced to the second part                            of duodenum. The upper GI  endoscopy was                            accomplished without difficulty. The patient                            tolerated the procedure well. Scope In: Scope Out: Findings:                 The esophagus was normal.                           A small hiatal hernia was present.                           Diffuse mild inflammation characterized by erythema                            and friability was found in the entire examined                            stomach. Biopsies were taken with a cold forceps                            for histology.                           The examined duodenum was normal. Complications:            No immediate complications. Estimated blood loss:                            None. Estimated Blood Loss:     Estimated blood loss: none. Impression:               - Normal esophagus.                           - Small hiatal hernia.                           - Gastritis. Biopsied.                           -  Normal examined duodenum. Recommendation:           - Patient has a contact number available for                            emergencies. The signs and symptoms of potential                            delayed complications were discussed with the                            patient. Return to normal activities tomorrow.                            Written discharge instructions were provided to the                            patient.                           - Resume previous diet.                           - Continue present medications.                           - Await pathology results. Milus Banister, MD 10/02/2016 3:50:15 PM This report has been signed electronically.

## 2016-10-02 NOTE — Op Note (Signed)
[place] Patient Name: Daniel Love Procedure Date: 10/02/2016 3:25 PM MRN: [id number] Endoscopist: Milus Banister , MD Age: 76 Referring MD:  Date of Birth: 09/01/1940 Gender: Male Account #: [id number] Procedure:                Colonoscopy Indications:              High risk colon cancer surveillance: Personal                            history of colonic polyps (2011 Colonoscopy Dr.                            Benson [place] with single subCM adenoma) Medicines:                Monitored Anesthesia Care Procedure:                Pre-Anesthesia Assessment:                           - Prior to the procedure, a History and Physical                            was performed, and patient medications and                            allergies were reviewed. The patient's tolerance of                            previous anesthesia was also reviewed. The risks                            and benefits of the procedure and the sedation                            options and risks were discussed with the patient.                            All questions were answered, and informed consent                            was obtained. Prior Anticoagulants: The patient has                            taken Coumadin (warfarin), last dose was 5 days                            prior to procedure. ASA Grade Assessment: II - A                            patient with mild systemic disease. After reviewing                            the risks and benefits, the patient was deemed in  satisfactory condition to undergo the procedure.                           After obtaining informed consent, the colonoscope                            was passed under direct vision. Throughout the                            procedure, the patient's blood pressure, pulse, and                            oxygen saturations were monitored continuously. The                            Model CF-HQ190L  [id number]) scope was introduced                            through the anus and advanced to the the cecum,                            identified by appendiceal orifice and ileocecal                            valve. The colonoscopy was performed without                            difficulty. The patient tolerated the procedure                            well. The quality of the bowel preparation was                            excellent. The ileocecal valve, appendiceal                            orifice, and rectum were photographed. Scope In: 3:28:27 PM Scope Out: 3:37:09 PM Scope Withdrawal Time: 0 hours 4 minutes 37 seconds  Total Procedure Duration: 0 hours 8 minutes 42 seconds  Findings:                 External and internal hemorrhoids were found. The                            hemorrhoids were small.                           There was minor 'barotrauma' to the cecum, proximal                            ascending segments.                           The exam was otherwise without abnormality on  direct and retroflexion views. Complications:            No immediate complications. Estimated blood loss:                            None. Estimated Blood Loss:     Estimated blood loss: none. Impression:               - Hemorrhoids                           - The entire examined colon is normal on direct and                            retroflexion views.                           - No specimens collected. Recommendation:           - Patient has a contact number available for                            emergencies. The signs and symptoms of potential                            delayed complications were discussed with the                            patient. Return to normal activities tomorrow.                            Written discharge instructions were provided to the                            patient.                           - Resume previous diet.                            - Continue present medications.                           -You do not need any further colon cancer screening                            tests (including stool testing). These types of                            tests generally stop around age 58-80. Milus Banister, MD 10/02/2016 3:41:40 PM This report has been signed electronically.

## 2016-10-03 ENCOUNTER — Telehealth: Payer: Self-pay

## 2016-10-03 NOTE — Telephone Encounter (Signed)
  Follow up Call-  Call back number 10/02/2016  Post procedure Call Back phone  # [phone] hm  Permission to leave phone message Yes  Some recent data might be hidden     Patient questions:  Do you have a fever, pain , or abdominal swelling? No. Pain Score  0 *  Have you tolerated food without any problems? Yes.    Have you been able to return to your normal activities? Yes.    Do you have any questions about your discharge instructions: Diet   No. Medications  No. Follow up visit  No.  Do you have questions or concerns about your Care? No.  Actions: * If pain score is 4 or above: No action needed, pain <4.

## 2016-10-08 ENCOUNTER — Encounter: Payer: Self-pay | Admitting: Gastroenterology

## 2016-10-16 ENCOUNTER — Ambulatory Visit (INDEPENDENT_AMBULATORY_CARE_PROVIDER_SITE_OTHER): Payer: Medicare Other | Admitting: *Deleted

## 2016-10-16 DIAGNOSIS — I4891 Unspecified atrial fibrillation: Secondary | ICD-10-CM | POA: Diagnosis not present

## 2016-10-16 DIAGNOSIS — Z7901 Long term (current) use of anticoagulants: Secondary | ICD-10-CM | POA: Diagnosis not present

## 2016-10-16 LAB — POCT INR: INR: 3.1

## 2016-11-06 ENCOUNTER — Ambulatory Visit (INDEPENDENT_AMBULATORY_CARE_PROVIDER_SITE_OTHER): Payer: Medicare Other | Admitting: *Deleted

## 2016-11-06 DIAGNOSIS — Z7901 Long term (current) use of anticoagulants: Secondary | ICD-10-CM | POA: Diagnosis not present

## 2016-11-06 DIAGNOSIS — I4891 Unspecified atrial fibrillation: Secondary | ICD-10-CM

## 2016-11-06 LAB — POCT INR: INR: 2.4

## 2016-12-03 ENCOUNTER — Other Ambulatory Visit: Payer: Self-pay | Admitting: Cardiovascular Disease

## 2016-12-04 ENCOUNTER — Ambulatory Visit (INDEPENDENT_AMBULATORY_CARE_PROVIDER_SITE_OTHER): Payer: Medicare Other | Admitting: *Deleted

## 2016-12-04 DIAGNOSIS — Z7901 Long term (current) use of anticoagulants: Secondary | ICD-10-CM | POA: Diagnosis not present

## 2016-12-04 DIAGNOSIS — I4891 Unspecified atrial fibrillation: Secondary | ICD-10-CM | POA: Diagnosis not present

## 2016-12-04 LAB — POCT INR: INR: 2.9

## 2016-12-13 DIAGNOSIS — H401132 Primary open-angle glaucoma, bilateral, moderate stage: Secondary | ICD-10-CM | POA: Diagnosis not present

## 2017-01-01 ENCOUNTER — Ambulatory Visit (INDEPENDENT_AMBULATORY_CARE_PROVIDER_SITE_OTHER): Payer: Medicare Other | Admitting: *Deleted

## 2017-01-01 DIAGNOSIS — Z7901 Long term (current) use of anticoagulants: Secondary | ICD-10-CM

## 2017-01-01 DIAGNOSIS — I4891 Unspecified atrial fibrillation: Secondary | ICD-10-CM | POA: Diagnosis not present

## 2017-01-01 LAB — POCT INR: INR: 2

## 2017-01-21 ENCOUNTER — Ambulatory Visit (INDEPENDENT_AMBULATORY_CARE_PROVIDER_SITE_OTHER): Payer: Medicare Other | Admitting: Podiatry

## 2017-01-21 ENCOUNTER — Encounter: Payer: Self-pay | Admitting: Podiatry

## 2017-01-21 ENCOUNTER — Ambulatory Visit (INDEPENDENT_AMBULATORY_CARE_PROVIDER_SITE_OTHER): Payer: Medicare Other

## 2017-01-21 VITALS — BP 131/91 | HR 78 | Resp 16 | Ht 71.0 in | Wt 173.8 lb

## 2017-01-21 DIAGNOSIS — M21619 Bunion of unspecified foot: Secondary | ICD-10-CM

## 2017-01-21 DIAGNOSIS — M2040 Other hammer toe(s) (acquired), unspecified foot: Secondary | ICD-10-CM

## 2017-01-21 NOTE — Progress Notes (Signed)
   Subjective:    Patient ID: Daniel Love, male    DOB: [birth year], 77 y.o.   MRN: [id number]  HPI  Chief Complaint  Patient presents with  . Hammertoe    Right; 2nd toe. Pt states that if he "wears tight fitting shoes it makes the pain worse"   . Bunions    BL; Denies pain       Review of Systems     Objective:   Physical Exam        Assessment & Plan:

## 2017-01-22 DIAGNOSIS — L821 Other seborrheic keratosis: Secondary | ICD-10-CM | POA: Diagnosis not present

## 2017-01-22 DIAGNOSIS — Z85828 Personal history of other malignant neoplasm of skin: Secondary | ICD-10-CM | POA: Diagnosis not present

## 2017-01-22 DIAGNOSIS — D1801 Hemangioma of skin and subcutaneous tissue: Secondary | ICD-10-CM | POA: Diagnosis not present

## 2017-01-22 DIAGNOSIS — L570 Actinic keratosis: Secondary | ICD-10-CM | POA: Diagnosis not present

## 2017-01-23 NOTE — Progress Notes (Signed)
Subjective:     Patient ID: Daniel Love, male   DOB: 02-16-1940, 77 y.o.   MRN: [id number]  HPI patient presents with family with severe structural foot deformity right over left with rigid contracture digit 2 severe structural bunion deformity and under lapping big toe. Develops pain in the second toe and around the second metatarsophalangeal joint with shoe gear usage   Review of Systems  All other systems reviewed and are negative.      Objective:   Physical Exam  Constitutional: He is oriented to person, place, and time.  Cardiovascular: Intact distal pulses.   Musculoskeletal: Normal range of motion.  Neurological: He is oriented to person, place, and time.  Skin: Skin is warm.  Nursing note and vitals reviewed.  neurovascular status intact muscle strength adequate range of motion was within normal limits with patient being active and well oriented. Patient's noted to have severe rigid contracture digit 2 right with the digit in a dorsiflex position painful when pressed with redness around the interphalangeal joint and significant under lapping hallux right with large bunion deformity noted. Patient's found to have good digital perfusion and is well oriented 3     Assessment:     Nonfunctioning second digit with severe structural deformity and elevation of the metatarsal secondary to structure with severe bunion deformity    Plan:     H&P CONDITIONS reviewed and discussed treatment options. I do think this should be a very difficult foot reconstruction even though it could be attempted with out guarantee of results. I also think there is a consideration due to the nonfunctioning of the digit for amputation of the second toe versus padding and we'll get a start with padding today see the results and how much better it makes him feel and decide if anything else might be of benefit to him  X-ray report indicates severe malalignment of the right foot with no indications of ostial  lysis or processes

## 2017-02-12 ENCOUNTER — Ambulatory Visit (INDEPENDENT_AMBULATORY_CARE_PROVIDER_SITE_OTHER): Payer: Medicare Other | Admitting: *Deleted

## 2017-02-12 DIAGNOSIS — I4891 Unspecified atrial fibrillation: Secondary | ICD-10-CM

## 2017-02-12 DIAGNOSIS — Z7901 Long term (current) use of anticoagulants: Secondary | ICD-10-CM

## 2017-02-12 LAB — POCT INR: INR: 2.8

## 2017-02-15 DIAGNOSIS — R972 Elevated prostate specific antigen [PSA]: Secondary | ICD-10-CM | POA: Diagnosis not present

## 2017-02-20 ENCOUNTER — Encounter: Payer: Self-pay | Admitting: Podiatry

## 2017-02-20 ENCOUNTER — Ambulatory Visit (INDEPENDENT_AMBULATORY_CARE_PROVIDER_SITE_OTHER): Payer: Medicare Other | Admitting: Podiatry

## 2017-02-20 DIAGNOSIS — M21619 Bunion of unspecified foot: Secondary | ICD-10-CM

## 2017-02-20 DIAGNOSIS — M2040 Other hammer toe(s) (acquired), unspecified foot: Secondary | ICD-10-CM | POA: Diagnosis not present

## 2017-02-20 NOTE — Patient Instructions (Addendum)

## 2017-02-21 NOTE — Progress Notes (Signed)
Subjective:     Patient ID: Daniel Love, male   DOB: 1940-02-23, 77 y.o.   MRN: [id number]  HPI patient presents with wife stating that she knows that he needs to get this fixed and that he does want to have amputation at this time   Review of Systems     Objective:   Physical Exam Neurovascular status was intact with patient noted to have severe hammertoe deformity second right leaning on top of the big toe with rigid contracture and no function present. The third digit has started to contract and the hallux pressed against it and it is moderately irritated at this time    Assessment:     Significant forefoot derangement with severe hammertoe deformity second right and hammertoe deformity third right    Plan:     Condition reviewed and I've recommended amputation of the second digit right with digital fusion digit 3 right. I did explain this could create slight balance issues or worsening of structural bunion condition but I do think it's his best option at his age and deformity. I had Dr. Jacqualyn Posey review and see the patient with me and we are both in agreement on the treatment plan. At this point I allowed patient and caregiver to review consent form going over alternative treatments complications. Patient wants surgery signed consent form is given all preoperative instructions for procedure and is scheduled for outpatient surgery in is encouraged to call with any questions prior to procedure with the understanding the recovery can take a proximally 6 months. We'll also plan on stopping his Coumadin approximate 5 days prior to procedure and that we'll be cleared with his physician

## 2017-02-22 ENCOUNTER — Encounter: Payer: Self-pay | Admitting: *Deleted

## 2017-02-22 DIAGNOSIS — R972 Elevated prostate specific antigen [PSA]: Secondary | ICD-10-CM | POA: Diagnosis not present

## 2017-02-25 ENCOUNTER — Other Ambulatory Visit: Payer: Self-pay | Admitting: Urology

## 2017-02-25 DIAGNOSIS — R972 Elevated prostate specific antigen [PSA]: Secondary | ICD-10-CM

## 2017-02-26 ENCOUNTER — Telehealth: Payer: Self-pay | Admitting: *Deleted

## 2017-02-26 NOTE — Telephone Encounter (Signed)
"  Please call me back.  Thank you."

## 2017-02-27 ENCOUNTER — Telehealth: Payer: Self-pay | Admitting: *Deleted

## 2017-02-27 ENCOUNTER — Telehealth: Payer: Self-pay | Admitting: Cardiovascular Disease

## 2017-02-27 NOTE — Telephone Encounter (Signed)
Follow Up:    Delydia try ing to follow up on clearance she sent over on 02-22-17. She needs to know about pt's Coumadin today please.

## 2017-02-27 NOTE — Telephone Encounter (Signed)
Correction to previous note. I saw patient in July , 2017. He has been doing well He is at low risk for his toe surgery. He may hold the coumadin for 5 days prior to surgery as noted in Larke Supple's note.

## 2017-02-27 NOTE — Telephone Encounter (Signed)
Pt on Coumadin for afib with CHADS2 score of 2 (HTN and age), no hx of stroke. Ok to hold Coumadin for 5 days prior to toe surgery.   Letter from 02/22/17 is also requesting cardiac clearance which will need to be addressed by Dr Acie Fredrickson.

## 2017-02-27 NOTE — Telephone Encounter (Signed)
"  I'm returning your call about being cleared for surgery.  Dr. Acie Fredrickson is not in the office today.  He and his nurse will return tomorrow.  I will forward the information to get back with you tomorrow.  If you have any questions give me a call back."

## 2017-02-27 NOTE — Telephone Encounter (Signed)
It appears that I have never met this patient ( transferring from [place]) It sounds like he is very stable and can probably be cleared easily .   He should be worked into an [employer] schedule for pre-op clearance

## 2017-02-27 NOTE — Telephone Encounter (Signed)
"  [name] this is Britney, one of the nurses from Dr. Elmarie Shiley office trying to get a hold of you.  Please give me a call back.  Ask to speak to [facility] in triage."  "I'm returning your call about Daniel Love.  "Yes, the pharmacist stated that he can stop his Coumadin 5 days before surgery date.  I got in contact with Dr. Acie Fredrickson and he is actually doing a letter right now stating the patient is at low risk for surgery.  Do you need me to send it to you?"  It's not necessary, we can see it in EPIC.  "Okay, it's in the notes."

## 2017-02-27 NOTE — Telephone Encounter (Signed)
Called patient and patient made aware to hold his coumadin 5 days prior to procedure.   Delydia made aware that patient needs to hold his coumadin 5 days prior to procedure and that Dr. Acie Fredrickson has deemed him as low risk for toe surgery.

## 2017-02-27 NOTE — Telephone Encounter (Signed)
Left message with Delydia that Dr. Acie Fredrickson will return tomorrow and that the message will be sent for his review.  See letter in [facility] sent on 02/22/17 requesting medical clearance and permission to stop coumadin prior to surgery scheduled for 03/05/17.

## 2017-02-27 NOTE — Telephone Encounter (Signed)
I called and left a message with Sunday Spillers for Dr. Acie Fredrickson.  I informed her that a letter was sent to Dr. Acie Fredrickson on 02/22/2017 requesting medical clearance for surgery.  Patient takes Coumadin.  Does patient need to stop taking Coumadin prior to surgery date?  She stated she would get the message to him and someone would give me a call back.

## 2017-02-27 NOTE — Telephone Encounter (Signed)
"  I'm calling to see if you all got clearance from my doctor about taking the Coumadin."  I called and left a message this morning.  I had sent a letter on 02/22/2017 requesting advice.  I do see a note in EPIC from [place] that states you can stop taking the Coumadin 5 days prior to surgery.  Dr. Acie Fredrickson is out of the office until tomorrow, 02/28/2017.  "Okay, glad to hear.  Do you know what time my surgery will be on Tuesday?"  No, someone from the surgical center will call you the Friday or Monday before with the arrival time.

## 2017-03-01 ENCOUNTER — Ambulatory Visit: Payer: Medicare Other | Admitting: Physician Assistant

## 2017-03-02 ENCOUNTER — Other Ambulatory Visit: Payer: Self-pay | Admitting: Cardiovascular Disease

## 2017-03-04 ENCOUNTER — Telehealth: Payer: Self-pay | Admitting: *Deleted

## 2017-03-04 NOTE — Telephone Encounter (Signed)
"  I'm calling to confirm my surgical time for tomorrow.  Call me back and let me know."  I'm returning your call.  Did you find out your surgery time?  "Yes, I called them.  Thanks for calling me back."

## 2017-03-05 ENCOUNTER — Encounter: Payer: Self-pay | Admitting: Podiatry

## 2017-03-05 DIAGNOSIS — M79674 Pain in right toe(s): Secondary | ICD-10-CM | POA: Diagnosis not present

## 2017-03-05 DIAGNOSIS — M2021 Hallux rigidus, right foot: Secondary | ICD-10-CM | POA: Diagnosis not present

## 2017-03-05 DIAGNOSIS — I10 Essential (primary) hypertension: Secondary | ICD-10-CM | POA: Diagnosis not present

## 2017-03-05 DIAGNOSIS — M2041 Other hammer toe(s) (acquired), right foot: Secondary | ICD-10-CM | POA: Diagnosis not present

## 2017-03-05 DIAGNOSIS — M86671 Other chronic osteomyelitis, right ankle and foot: Secondary | ICD-10-CM | POA: Diagnosis not present

## 2017-03-13 ENCOUNTER — Ambulatory Visit (INDEPENDENT_AMBULATORY_CARE_PROVIDER_SITE_OTHER): Payer: Medicare Other

## 2017-03-13 ENCOUNTER — Ambulatory Visit (INDEPENDENT_AMBULATORY_CARE_PROVIDER_SITE_OTHER): Payer: Self-pay | Admitting: Podiatry

## 2017-03-13 DIAGNOSIS — M2040 Other hammer toe(s) (acquired), unspecified foot: Secondary | ICD-10-CM | POA: Diagnosis not present

## 2017-03-13 DIAGNOSIS — M21619 Bunion of unspecified foot: Secondary | ICD-10-CM | POA: Diagnosis not present

## 2017-03-13 DIAGNOSIS — Z9889 Other specified postprocedural states: Secondary | ICD-10-CM

## 2017-03-15 NOTE — Progress Notes (Signed)
Subjective:     Patient ID: Daniel Love, male   DOB: 1940-02-25, 77 y.o.   MRN: [id number]  HPI patient presents with caregiver stating that he is doing well   Review of Systems     Objective:   Physical Exam Neurovascular status intact negative Homans sign was noted with well-healed surgical site right third digit with satisfactory amputation second digit right foot    Assessment:     Doing well post surgery of the right foot    Plan:     X-rays reviewed sterile dressing reapplied advised a continued elevation compression immobilization which shoe and reappoint 2 weeks for suture removal or earlier if needed  X-ray report indicates satisfactory removal second digit right with good alignment third digit with pin in place

## 2017-03-22 ENCOUNTER — Ambulatory Visit
Admission: RE | Admit: 2017-03-22 | Discharge: 2017-03-22 | Disposition: A | Discharge status: Home / Self Care | Payer: Medicare Other | Source: Ambulatory Visit | Attending: Urology | Admitting: Urology

## 2017-03-22 DIAGNOSIS — R972 Elevated prostate specific antigen [PSA]: Secondary | ICD-10-CM | POA: Diagnosis not present

## 2017-03-22 MED ORDER — GADOBENATE DIMEGLUMINE 529 MG/ML IV SOLN
16.0000 mL | Freq: Once | INTRAVENOUS | Status: AC | PRN
  Administered 2017-03-22: 16 mL via INTRAVENOUS

## 2017-03-26 ENCOUNTER — Ambulatory Visit (INDEPENDENT_AMBULATORY_CARE_PROVIDER_SITE_OTHER): Payer: Medicare Other | Admitting: *Deleted

## 2017-03-26 DIAGNOSIS — I4891 Unspecified atrial fibrillation: Secondary | ICD-10-CM | POA: Diagnosis not present

## 2017-03-26 DIAGNOSIS — Z7901 Long term (current) use of anticoagulants: Secondary | ICD-10-CM | POA: Diagnosis not present

## 2017-03-26 LAB — POCT INR: INR: 2.4

## 2017-03-27 ENCOUNTER — Ambulatory Visit (INDEPENDENT_AMBULATORY_CARE_PROVIDER_SITE_OTHER): Payer: Medicare Other

## 2017-03-27 ENCOUNTER — Ambulatory Visit (INDEPENDENT_AMBULATORY_CARE_PROVIDER_SITE_OTHER): Payer: Medicare Other | Admitting: Podiatry

## 2017-03-27 DIAGNOSIS — Z9889 Other specified postprocedural states: Secondary | ICD-10-CM

## 2017-03-27 DIAGNOSIS — M2040 Other hammer toe(s) (acquired), unspecified foot: Secondary | ICD-10-CM | POA: Diagnosis not present

## 2017-03-27 DIAGNOSIS — M21619 Bunion of unspecified foot: Secondary | ICD-10-CM

## 2017-03-27 DIAGNOSIS — R600 Localized edema: Secondary | ICD-10-CM

## 2017-03-28 ENCOUNTER — Ambulatory Visit: Payer: Medicare Other

## 2017-03-28 NOTE — Progress Notes (Signed)
Subjective:     Patient ID: Daniel Love, male   DOB: Jan 20, 1940, 77 y.o.   MRN: [id number]  HPI patient states I'm doing well with minimal discomfort or swelling   Review of Systems     Objective:   Physical Exam Neurovascular status intact with pin in place third digit right and incision sites that are well coapted with stitches intact and no drainage    Assessment:     Doing well post amputation digit 2 right digital fusion digit 3 right    Plan:     Stitches removed x-ray reviewed and sterile dressing reapplied. Reappoint 2 weeks for pin removal or earlier if needed  X-ray report indicates pin is in reasonably good alignment and holding the hallux in appropriate position

## 2017-04-05 ENCOUNTER — Ambulatory Visit (INDEPENDENT_AMBULATORY_CARE_PROVIDER_SITE_OTHER): Payer: Self-pay | Admitting: Podiatry

## 2017-04-05 ENCOUNTER — Ambulatory Visit (INDEPENDENT_AMBULATORY_CARE_PROVIDER_SITE_OTHER): Payer: Medicare Other

## 2017-04-05 DIAGNOSIS — Z9889 Other specified postprocedural states: Secondary | ICD-10-CM

## 2017-04-05 DIAGNOSIS — M21619 Bunion of unspecified foot: Secondary | ICD-10-CM

## 2017-04-05 DIAGNOSIS — M2040 Other hammer toe(s) (acquired), unspecified foot: Secondary | ICD-10-CM

## 2017-04-06 NOTE — Progress Notes (Signed)
Subjective: Daniel Love is a 77 y.o. is seen today in office s/p right 2nd digit amputation and 3rd digit hammertoe repair with pin preformed on 03/05/17. They state their pain is controlled. He presents today for pin removal. He has remained in the surgical shoe.  Denies any systemic complaints such as fevers, chills, nausea, vomiting. No calf pain, chest pain, shortness of breath.   Objective: General: No acute distress, AAOx3  DP/PT pulses palpable 2/4, CRT < 3 sec to all digits.  Protective sensation intact. Motor function intact.  Right foot: Incision is well coapted without any evidence of dehiscence and scar has formed. There is no surrounding erythema, ascending cellulitis, fluctuance, crepitus, malodor, drainage/purulence. There is trace edema around the surgical site. There is no pain along the surgical site. K wire intact the third toe without any drainage or pus. No other areas of tenderness to bilateral lower extremities.  No other open lesions or pre-ulcerative lesions.  No pain with calf compression, swelling, warmth, erythema.   Assessment and Plan:  Status post right foot surgery, doing well with no complications   -Treatment options discussed including all alternatives, risks, and complications -X-rays were obtained and reviewed. No evidence of acute fracture. K wire intact to the third toe. Status post amputation of second digit. -K wire was removed today in total without complications. Antibiotic ointment was applied followed by a bandage. He can start to shower tomorrow. -Inserted transition to regular shoe as tolerated. -Darco splint dispensed to help hold the toe in a corrected position.  -Ice/elevation -Monitor for any clinical signs or symptoms of infection and DVT/PE and directed to call the office immediately should any occur or go to the ER. -Follow-up as scheduled or sooner if any problems arise. In the meantime, encouraged to call the office with any questions,  concerns, change in symptoms.   Celesta Gentile, DPM

## 2017-04-19 ENCOUNTER — Ambulatory Visit (INDEPENDENT_AMBULATORY_CARE_PROVIDER_SITE_OTHER): Payer: Medicare Other

## 2017-04-19 ENCOUNTER — Ambulatory Visit: Payer: Medicare Other

## 2017-04-19 ENCOUNTER — Ambulatory Visit (INDEPENDENT_AMBULATORY_CARE_PROVIDER_SITE_OTHER): Payer: Medicare Other | Admitting: Podiatry

## 2017-04-19 DIAGNOSIS — M2040 Other hammer toe(s) (acquired), unspecified foot: Secondary | ICD-10-CM | POA: Diagnosis not present

## 2017-04-19 DIAGNOSIS — Z9889 Other specified postprocedural states: Secondary | ICD-10-CM | POA: Diagnosis not present

## 2017-04-19 DIAGNOSIS — M21619 Bunion of unspecified foot: Secondary | ICD-10-CM | POA: Diagnosis not present

## 2017-04-21 NOTE — Progress Notes (Signed)
Subjective:     Patient ID: Daniel Love, male   DOB: [birth year], 77 y.o.   MRN: [id number]  HPI patient presents stating that he has some swelling in his foot but his toes are doing very well where we did the surgery   Review of Systems     Objective:   Physical Exam Neurovascular status intact negative Homans sign was noted with patient having amputation second digit and arthroplasty third digit which are healing well with moderate edema in the forefoot right and negative Homans sign noted    Assessment:     Inflammation which may be due to just not wearing any kind of shoe gear or compression along with well-healing surgical sites right    Plan:     X-ray reviewed and gradual increase in activity along with compression for his foot and ankle and elevation

## 2017-05-07 ENCOUNTER — Ambulatory Visit (INDEPENDENT_AMBULATORY_CARE_PROVIDER_SITE_OTHER): Payer: Medicare Other | Admitting: *Deleted

## 2017-05-07 DIAGNOSIS — I4891 Unspecified atrial fibrillation: Secondary | ICD-10-CM

## 2017-05-07 DIAGNOSIS — Z7901 Long term (current) use of anticoagulants: Secondary | ICD-10-CM | POA: Diagnosis not present

## 2017-05-07 LAB — POCT INR: INR: 3.4

## 2017-05-21 ENCOUNTER — Ambulatory Visit (INDEPENDENT_AMBULATORY_CARE_PROVIDER_SITE_OTHER): Payer: Medicare Other | Admitting: *Deleted

## 2017-05-21 DIAGNOSIS — Z7901 Long term (current) use of anticoagulants: Secondary | ICD-10-CM | POA: Diagnosis not present

## 2017-05-21 DIAGNOSIS — I4891 Unspecified atrial fibrillation: Secondary | ICD-10-CM

## 2017-05-21 LAB — POCT INR: INR: 2.6

## 2017-06-06 NOTE — Progress Notes (Signed)
1. Amputation 2nd toe right foot 2. Fusion with pin 3rd toe right

## 2017-06-28 ENCOUNTER — Other Ambulatory Visit: Payer: Self-pay | Admitting: Cardiovascular Disease

## 2017-07-04 ENCOUNTER — Other Ambulatory Visit: Payer: Self-pay | Admitting: Cardiovascular Disease

## 2017-07-25 DIAGNOSIS — L03116 Cellulitis of left lower limb: Secondary | ICD-10-CM | POA: Diagnosis not present

## 2017-07-30 ENCOUNTER — Ambulatory Visit (INDEPENDENT_AMBULATORY_CARE_PROVIDER_SITE_OTHER): Payer: Medicare Other | Admitting: Pharmacist

## 2017-07-30 DIAGNOSIS — I4891 Unspecified atrial fibrillation: Secondary | ICD-10-CM

## 2017-07-30 DIAGNOSIS — Z7901 Long term (current) use of anticoagulants: Secondary | ICD-10-CM | POA: Diagnosis not present

## 2017-07-30 LAB — POCT INR: INR: 2.4

## 2017-08-13 DIAGNOSIS — R972 Elevated prostate specific antigen [PSA]: Secondary | ICD-10-CM | POA: Diagnosis not present

## 2017-08-20 DIAGNOSIS — R972 Elevated prostate specific antigen [PSA]: Secondary | ICD-10-CM | POA: Diagnosis not present

## 2017-08-30 ENCOUNTER — Encounter: Payer: Self-pay | Admitting: Cardiovascular Disease

## 2017-08-30 ENCOUNTER — Ambulatory Visit (INDEPENDENT_AMBULATORY_CARE_PROVIDER_SITE_OTHER): Payer: Medicare Other | Admitting: Cardiovascular Disease

## 2017-08-30 ENCOUNTER — Ambulatory Visit (INDEPENDENT_AMBULATORY_CARE_PROVIDER_SITE_OTHER): Payer: Medicare Other | Admitting: Pharmacist

## 2017-08-30 VITALS — BP 110/80 | HR 69 | Ht 71.0 in | Wt 180.4 lb

## 2017-08-30 DIAGNOSIS — I119 Hypertensive heart disease without heart failure: Secondary | ICD-10-CM | POA: Diagnosis not present

## 2017-08-30 DIAGNOSIS — I482 Chronic atrial fibrillation, unspecified: Secondary | ICD-10-CM

## 2017-08-30 DIAGNOSIS — I4891 Unspecified atrial fibrillation: Secondary | ICD-10-CM | POA: Diagnosis not present

## 2017-08-30 DIAGNOSIS — Z7901 Long term (current) use of anticoagulants: Secondary | ICD-10-CM

## 2017-08-30 LAB — POCT INR: INR: 2.8

## 2017-08-30 NOTE — Patient Instructions (Signed)
Medication Instructions:  Your provider recommends that you continue on your current medications as directed. Please refer to the Current Medication list given to you today.    Labwork: TODAY: BMET, CBC  Testing/Procedures: None  Follow-Up: Your provider wants you to follow-up in: 6 months with Richardson Dopp, PA. You will receive a reminder letter in the mail two months in advance. If you don't receive a letter, please call our office to schedule the follow-up appointment.    Any Other Special Instructions Will Be Listed Below (If Applicable).     If you need a refill on your cardiac medications before your next appointment, please call your pharmacy.

## 2017-08-30 NOTE — Progress Notes (Signed)
Cardiology Office Note   Date:  08/30/2017   ID:  Daniel Love, DOB 05-06-1940, MRN [id number]  PCP:  Daniel Noon, MD  Cardiologist: Darlin Coco MD  No chief complaint on file.  Problem list 1. Chronic atrial fibrillation 2. Essential hypertension 3.   July 12, 2015 - notes from Daniel Love is a 77 y.o. male who presents for a one-year follow-up visit  This 77 year old gentleman is seen for a one-year followup office visit. He has a history of permanent atrial fibrillation. We initially saw him in December 2010 at which time he was in atrial fibrillation. He underwent cardioversion on 01/27/10 successfully. He was then lost to followup until 05/14/13 at which time he returned in atrial fibrillation. Over that period of time he continued to get his prothrombin time faithfully. He is asymptomatic in terms of his atrial fibrillation. Not aware of his heart rate. He does have occasional mild lightheaded spells which may be related to his bradycardia. He's had a past history of hypertension.  He denies any chest pain or shortness of breath.  Energy level is satisfactory.  He does not have any history of ischemic heart disease or TIA or stroke  July 20, 2016:  Daniel Love is seen today  Has chronic atrial fib with slow HR  No syncope or presyncope  Aug. 31, 2018  Has rare episodes of chest congestion  / bronchitis, Feels like he needs to cough.  Walks about a mild a day without any problems.    Past Medical History:  Diagnosis Date  . Anal fissure    hx of   . Atrial fibrillation (HCC)    maintaining normal sinus rhythm  . Cancer ([place])    skin cancer on arm  . Chronic anticoagulation    coumadin  . Dyspepsia   . GERD (gastroesophageal reflux disease)   . Glaucoma   . History of colon polyps 2012   Colonoscopy-Dr. Benson Norway   . History of kidney stones   . Hypertension     Past Surgical History:  Procedure Laterality Date  . BACK SURGERY   12/31/1977   for degenerative disc disease  . COLONOSCOPY    . PROSTATE BIOPSY    . UPPER GASTROINTESTINAL ENDOSCOPY       Current Outpatient Prescriptions  Medication Sig Dispense Refill  . colchicine 0.6 MG tablet as needed.    [name] LUMIGAN 0.01 % SOLN Place 1 drop into both eyes daily.    . Methylcellulose, Laxative, (CITRUCEL PO) Take by mouth as needed (for constipation).     . Multiple Vitamins-Minerals (CENTRUM SILVER PO) Take 1 tablet by mouth every morning.      . nystatin cream (MYCOSTATIN) Apply 1 application topically as needed (RASH).     [name] olmesartan-hydrochlorothiazide (BENICAR HCT) 40-25 MG tablet TAKE ONE TABLET BY MOUTH DAILY 90 tablet 0  . omeprazole (PRILOSEC) 40 MG capsule Take 40 mg by mouth daily as needed.     . warfarin (COUMADIN) 5 MG tablet TAKE AS DIRECTED BY COUMADIN CLINIC 40 tablet 2   Current Facility-Administered Medications  Medication Dose Route Frequency Provider Last Rate Last Dose  . 0.9 %  sodium chloride infusion  500 mL Intravenous Continuous Milus Banister, MD        Allergies:   Amlodipine and Lisinopril    Social History:  The patient  reports that he quit smoking about 50 years ago. His smoking use included Cigarettes. He has never used  smokeless tobacco. He reports that he drinks about 4.2 oz of alcohol per week . He reports that he does not use drugs.   Family History:  The patient's family history includes Breast cancer in his mother; Cancer in his father and mother; Colon cancer in his maternal grandmother; Heart attack in his father; Heart failure in his father.    ROS:  Please see the history of present illness.   Otherwise, review of systems are positive for none.   All other systems are reviewed and negative.    PHYSICAL EXAM: VS:  BP 110/80   Pulse 69   Ht 5\' 11"  (1.803 m)   Wt 180 lb 6.4 oz (81.8 kg)   SpO2 98%   BMI 25.16 kg/m  , BMI Body mass index is 25.16 kg/m. GEN: Well nourished, well developed, in no acute  distress  HEENT: normal  Neck: no JVD, carotid bruits, or masses Cardiac: Irregularly irregular.; no murmurs, rubs, or gallops,no edema  Respiratory:  clear to auscultation bilaterally, normal work of breathing GI: soft, nontender, nondistended, + BS MS: no deformity or atrophy  Skin: warm and dry, no rash Neuro:  Strength and sensation are intact Psych: euthymic mood, full affect   EKG:  EKG is ordered today. The ekg ordered today demonstrates atrial fibrillation with ventricular response 64.    No ischemic changes.   Recent Labs: No results found for requested labs within last 8760 hours.    Lipid Panel No results found for: CHOL, TRIG, HDL, CHOLHDL, VLDL, LDLCALC, LDLDIRECT    Wt Readings from Last 3 Encounters:  08/30/17 180 lb 6.4 oz (81.8 kg)  01/21/17 173 lb 12.8 oz (78.8 kg)  10/02/16 177 lb (80.3 kg)        ASSESSMENT AND PLAN:  1. Atrial fibrillation:    On long-term warfarin.   Ventricular rate is slow.    Continue to follow INR Will check CBC and BMP today  We have discussed starting Xarelto or Eliquis in the past.  I would favor starting one of these instead of coumadin if he wishes.      2. Essential HTN: BP is well controlled    Mertie Moores, MD  08/30/2017 11:42 AM    [facility] [address],  [address], [place]  [address] Pager [phone] Phone: [phone]; Fax: [phone]

## 2017-08-31 LAB — BASIC METABOLIC PANEL
BUN/Creatinine Ratio: 16 (ref 10–24)
BUN: 21 mg/dL (ref 8–27)
CALCIUM: 9.2 mg/dL (ref 8.6–10.2)
CO2: 25 mmol/L (ref 20–29)
CREATININE: 1.33 mg/dL — AB (ref 0.76–1.27)
Chloride: 101 mmol/L (ref 96–106)
GFR, EST AFRICAN AMERICAN: 60 mL/min/{1.73_m2} (ref >59)
GFR, EST NON AFRICAN AMERICAN: 52 mL/min/{1.73_m2} — AB (ref >59)
Glucose: 81 mg/dL (ref 65–99)
Potassium: 3.9 mmol/L (ref 3.5–5.2)
Sodium: 144 mmol/L (ref 134–144)

## 2017-08-31 LAB — CBC WITH DIFFERENTIAL/PLATELET
BASOS: 1 %
Basophils Absolute: 0 10*3/uL (ref 0.0–0.2)
EOS (ABSOLUTE): 0.1 10*3/uL (ref 0.0–0.4)
Eos: 2 %
HEMOGLOBIN: 14.8 g/dL (ref 13.0–17.7)
Hematocrit: 45.2 % (ref 37.5–51.0)
IMMATURE GRANS (ABS): 0 10*3/uL (ref 0.0–0.1)
Immature Granulocytes: 0 %
LYMPHS: 18 %
Lymphocytes Absolute: 1.2 10*3/uL (ref 0.7–3.1)
MCH: 30 pg (ref 26.6–33.0)
MCHC: 32.7 g/dL (ref 31.5–35.7)
MCV: 92 fL (ref 79–97)
MONOCYTES: 9 %
Monocytes Absolute: 0.6 10*3/uL (ref 0.1–0.9)
Neutrophils Absolute: 4.6 10*3/uL (ref 1.4–7.0)
Neutrophils: 70 %
Platelets: 168 10*3/uL (ref 150–379)
RBC: 4.93 x10E6/uL (ref 4.14–5.80)
RDW: 15 % (ref 12.3–15.4)
WBC: 6.6 10*3/uL (ref 3.4–10.8)

## 2017-09-24 DIAGNOSIS — Z23 Encounter for immunization: Secondary | ICD-10-CM | POA: Diagnosis not present

## 2017-10-04 ENCOUNTER — Other Ambulatory Visit: Payer: Self-pay | Admitting: Cardiovascular Disease

## 2017-10-08 ENCOUNTER — Ambulatory Visit (INDEPENDENT_AMBULATORY_CARE_PROVIDER_SITE_OTHER): Payer: Medicare Other | Admitting: *Deleted

## 2017-10-08 DIAGNOSIS — Z7901 Long term (current) use of anticoagulants: Secondary | ICD-10-CM | POA: Diagnosis not present

## 2017-10-08 DIAGNOSIS — I4891 Unspecified atrial fibrillation: Secondary | ICD-10-CM

## 2017-10-08 DIAGNOSIS — Z5181 Encounter for therapeutic drug level monitoring: Secondary | ICD-10-CM

## 2017-10-08 LAB — POCT INR: INR: 2.9

## 2017-10-28 DIAGNOSIS — H4010X0 Unspecified open-angle glaucoma, stage unspecified: Secondary | ICD-10-CM | POA: Diagnosis not present

## 2017-11-14 DIAGNOSIS — H401132 Primary open-angle glaucoma, bilateral, moderate stage: Secondary | ICD-10-CM | POA: Diagnosis not present

## 2017-11-19 ENCOUNTER — Ambulatory Visit (INDEPENDENT_AMBULATORY_CARE_PROVIDER_SITE_OTHER): Payer: Medicare Other | Admitting: *Deleted

## 2017-11-19 DIAGNOSIS — I4891 Unspecified atrial fibrillation: Secondary | ICD-10-CM | POA: Diagnosis not present

## 2017-11-19 DIAGNOSIS — Z7901 Long term (current) use of anticoagulants: Secondary | ICD-10-CM

## 2017-11-19 LAB — POCT INR: INR: 3

## 2017-11-19 NOTE — Patient Instructions (Signed)
Continue taking same dosage 1 tablet daily except 1.5 tablets on Sundays and Wednesdays. Recheck in 6 weeks  Call us with any update or changes [phone].

## 2017-12-03 ENCOUNTER — Other Ambulatory Visit: Payer: Self-pay | Admitting: *Deleted

## 2017-12-03 MED ORDER — WARFARIN SODIUM 5 MG PO TABS: ORAL_TABLET | ORAL | 2 refills | Status: DC

## 2018-01-01 ENCOUNTER — Ambulatory Visit (INDEPENDENT_AMBULATORY_CARE_PROVIDER_SITE_OTHER): Payer: Medicare Other | Admitting: *Deleted

## 2018-01-01 DIAGNOSIS — Z7901 Long term (current) use of anticoagulants: Secondary | ICD-10-CM

## 2018-01-01 DIAGNOSIS — I4891 Unspecified atrial fibrillation: Secondary | ICD-10-CM | POA: Diagnosis not present

## 2018-01-01 LAB — POCT INR: INR: 2.3

## 2018-01-01 NOTE — Patient Instructions (Signed)
Description   Continue taking same dosage 1 tablet daily except 1.5 tablets on Sundays and Wednesdays. Recheck in 6 weeks  Call us with any update or changes [phone].

## 2018-01-24 DIAGNOSIS — C44619 Basal cell carcinoma of skin of left upper limb, including shoulder: Secondary | ICD-10-CM | POA: Diagnosis not present

## 2018-01-24 DIAGNOSIS — L812 Freckles: Secondary | ICD-10-CM | POA: Diagnosis not present

## 2018-01-24 DIAGNOSIS — D1801 Hemangioma of skin and subcutaneous tissue: Secondary | ICD-10-CM | POA: Diagnosis not present

## 2018-01-24 DIAGNOSIS — L570 Actinic keratosis: Secondary | ICD-10-CM | POA: Diagnosis not present

## 2018-01-24 DIAGNOSIS — Z85828 Personal history of other malignant neoplasm of skin: Secondary | ICD-10-CM | POA: Diagnosis not present

## 2018-01-24 DIAGNOSIS — L820 Inflamed seborrheic keratosis: Secondary | ICD-10-CM | POA: Diagnosis not present

## 2018-01-24 DIAGNOSIS — D485 Neoplasm of uncertain behavior of skin: Secondary | ICD-10-CM | POA: Diagnosis not present

## 2018-01-24 DIAGNOSIS — L821 Other seborrheic keratosis: Secondary | ICD-10-CM | POA: Diagnosis not present

## 2018-02-05 ENCOUNTER — Encounter: Payer: Self-pay | Admitting: Physician Assistant

## 2018-02-12 ENCOUNTER — Ambulatory Visit (INDEPENDENT_AMBULATORY_CARE_PROVIDER_SITE_OTHER): Payer: Medicare Other | Admitting: Physician Assistant

## 2018-02-12 ENCOUNTER — Encounter: Payer: Self-pay | Admitting: Physician Assistant

## 2018-02-12 ENCOUNTER — Ambulatory Visit (INDEPENDENT_AMBULATORY_CARE_PROVIDER_SITE_OTHER): Payer: Medicare Other | Admitting: *Deleted

## 2018-02-12 VITALS — BP 150/80 | HR 80 | Ht 71.0 in | Wt 182.4 lb

## 2018-02-12 DIAGNOSIS — Z7901 Long term (current) use of anticoagulants: Secondary | ICD-10-CM | POA: Diagnosis not present

## 2018-02-12 DIAGNOSIS — I482 Chronic atrial fibrillation: Secondary | ICD-10-CM

## 2018-02-12 DIAGNOSIS — I4891 Unspecified atrial fibrillation: Secondary | ICD-10-CM

## 2018-02-12 DIAGNOSIS — I10 Essential (primary) hypertension: Secondary | ICD-10-CM

## 2018-02-12 DIAGNOSIS — I4821 Permanent atrial fibrillation: Secondary | ICD-10-CM

## 2018-02-12 DIAGNOSIS — R079 Chest pain, unspecified: Secondary | ICD-10-CM | POA: Diagnosis not present

## 2018-02-12 DIAGNOSIS — R972 Elevated prostate specific antigen [PSA]: Secondary | ICD-10-CM | POA: Diagnosis not present

## 2018-02-12 LAB — POCT INR: INR: 1.7

## 2018-02-12 MED ORDER — AMLODIPINE BESYLATE 5 MG PO TABS: 5.0000 mg | ORAL_TABLET | Freq: Every day | ORAL | 1 refills | Status: DC

## 2018-02-12 NOTE — Patient Instructions (Signed)
Medication Instructions:  Your physician has recommended you make the following change in your medication:  1.  START Norvasc 5 mg daily  Labwork: TODAY:  BMET & TSH  Testing/Procedures: Your physician has requested that you have en exercise stress myoview. For further information please visit [email or web address]. Please follow instruction sheet, as given.    Follow-Up: Your physician recommends that you schedule a follow-up appointment in: [place], PA-C   Any Other Special Instructions Will Be Listed Below (If Applicable).  Exercise Stress Electrocardiogram An exercise stress electrocardiogram is a test to check how blood flows to your heart. It is done to find areas of poor blood flow. You will need to walk on a treadmill for this test. The electrocardiogram will record your heartbeat when you are at rest and when you are exercising. What happens before the procedure?  Do not have drinks with caffeine or foods with caffeine for 24 hours before the test, or as told by your doctor. This includes coffee, tea (even decaf tea), sodas, chocolate, and cocoa.  Follow your doctor's instructions about eating and drinking before the test.  Ask your doctor what medicines you should or should not take before the test. Take your medicines with water unless told by your doctor not to.  If you use an inhaler, bring it with you to the test.  Bring a snack to eat after the test.  Do not  smoke for 4 hours before the test.  Do not put lotions, powders, creams, or oils on your chest before the test.  Wear comfortable shoes and clothing. What happens during the procedure?  You will have patches put on your chest. Small areas of your chest may need to be shaved. Wires will be connected to the patches.  Your heart rate will be watched while you are resting and while you are exercising.  You will walk on the treadmill. The treadmill will slowly get faster to raise  your heart rate.  The test will take about 1-2 hours. What happens after the procedure?  Your heart rate and blood pressure will be watched after the test.  You may return to your normal diet, activities, and medicines or as told by your doctor. This information is not intended to replace advice given to you by your health care provider. Make sure you discuss any questions you have with your health care provider. Document Released: 06/04/2008 Document Revised: 08/15/2016 Document Reviewed: 08/24/2013 Elsevier Interactive Patient Education  [employer].    If you need a refill on your cardiac medications before your next appointment, please call your pharmacy.

## 2018-02-12 NOTE — Patient Instructions (Signed)
Description   When gets home today take another 1/2 tablet of coumadin since has already taken 1 and 1/2 tablets then  continue taking same dosage 1 tablet daily except 1.5 tablets on Sundays and Wednesdays. Recheck in 2 weeks  Call us with any update or changes [phone].

## 2018-02-12 NOTE — Progress Notes (Signed)
Cardiology Office Note:    Date:  02/12/2018   ID:  Daniel Love, DOB 05-30-1940, MRN [id number]  PCP:  Daniel Noon, MD  Cardiologist:  Daniel Moores, MD   Referring MD: Daniel Noon, MD   Chief Complaint  Patient presents with  . Follow-up    Atrial fibrillation, hypertension  . Chest Pain    History of Present Illness:    Daniel Love is a 78 y.o. male with a hx of permanent atrial fibrillation, status post prior cardioversion in 2011, hypertension, chronic anticoagulation with warfarin.  Last seen by Dr. Acie Love 07/2017.  Daniel Love returns for follow-up.  He is seen in conjunction with his wife, Daniel Love.  He has overall been doing well.  He has noted that his blood pressure is running higher over the past 6-8 months.  He did have some weight gain that was difficult to lose for several months.  He denies any medication changes or recent illnesses.  He is quite active.  He has noted right shoulder discomfort while walking his dog and with other activities.  It seems to radiate to the right chest.  He denies any substernal chest pain.  He has not had pain with range of motion.  He denies shortness of breath, syncope, PND or edema.  He denies pleuritic chest pain.  Prior CV studies:   The following studies were reviewed today:  Echo 10/06/09 EF 55-60, mild LVH, mild BAE, mild aortic sclerosis, mild AI, mild MR, mild TR, RVSP 40  Echo 05/18/08 EF 55-60, mild LVH, diastolic dysfunction, mild aortic sclerosis, mild MR, RVSP 34  Past Medical History:  Diagnosis Date  . Anal fissure    hx of   . Atrial fibrillation (HCC)    maintaining normal sinus rhythm  . Cancer ([place])    skin cancer on arm  . Chronic anticoagulation    coumadin  . Dyspepsia   . GERD (gastroesophageal reflux disease)   . Glaucoma   . History of colon polyps 2012   Colonoscopy-Dr. Benson Love   . History of kidney stones   . Hypertension     Past Surgical History:  Procedure Laterality Date  .  BACK SURGERY  12/31/1977   for degenerative disc disease  . COLONOSCOPY    . PROSTATE BIOPSY    . UPPER GASTROINTESTINAL ENDOSCOPY      Current Medications: Current Meds  Medication Sig  . colchicine 0.6 MG tablet as needed.  [name] LUMIGAN 0.01 % SOLN Place 1 drop into both eyes daily.  . Methylcellulose, Laxative, (CITRUCEL PO) Take by mouth as needed (for constipation).   . Multiple Vitamins-Minerals (CENTRUM SILVER PO) Take 1 tablet by mouth every morning.    . nystatin cream (MYCOSTATIN) Apply 1 application topically as needed (RASH).   [name] olmesartan-hydrochlorothiazide (BENICAR HCT) 40-25 MG tablet TAKE ONE TABLET BY MOUTH DAILY  . omeprazole (PRILOSEC) 40 MG capsule Take 40 mg by mouth daily as needed.   . warfarin (COUMADIN) 5 MG tablet TAKE AS DIRECTED BY COUMADIN CLINIC   Current Facility-Administered Medications for the 02/12/18 encounter (Office Visit) with Daniel Love T, PA-C  Medication  . 0.9 %  sodium chloride infusion     Allergies:   Amlodipine and Lisinopril   Social History   Tobacco Use  . Smoking status: Former Smoker    Types: Cigarettes    Last attempt to quit: 12/31/1966    Years since quitting: 51.1  . Smokeless tobacco: Never Used  Substance Use Topics  .  Alcohol use: Yes    Alcohol/week: 4.2 oz    Types: 7 Glasses of wine per week    Comment: 1 daily   . Drug use: No     Family Hx: The patient's family history includes Breast cancer in his mother; Cancer in his father and mother; Colon cancer in his maternal grandmother; Heart attack in his father; Heart failure in his father. There is no history of Stroke, Esophageal cancer, Rectal cancer, or Stomach cancer.  ROS:   Please see the history of present illness.    Review of Systems  Constitution: Positive for weight gain.   All other systems reviewed and are negative.   EKGs/Labs/Other Test Reviewed:    EKG:  EKG is  ordered today.  The ekg ordered today demonstrates atrial fibrillation, HR 61,  leftward axis, QTC 398, no significant change since prior tracing  Recent Labs: 08/30/2017: BUN 21; Creatinine, Ser 1.33; Hemoglobin 14.8; Platelets 168; Potassium 3.9; Sodium 144   Recent Lipid Panel No results found for: CHOL, TRIG, HDL, CHOLHDL, LDLCALC, LDLDIRECT  Physical Exam:    VS:  BP (!) 150/80   Pulse 80   Ht 5\' 11"  (1.803 m)   Wt 182 lb 6.4 oz (82.7 kg)   SpO2 98%   BMI 25.44 kg/m     Wt Readings from Last 3 Encounters:  02/12/18 182 lb 6.4 oz (82.7 kg)  08/30/17 180 lb 6.4 oz (81.8 kg)  01/21/17 173 lb 12.8 oz (78.8 kg)     Physical Exam  Constitutional: He is oriented to person, place, and time. He appears well-developed and well-nourished. No distress.  HENT:  Head: Normocephalic and atraumatic.  Neck: No JVD present.  Cardiovascular: Normal rate. An irregularly irregular rhythm present.  No murmur heard. Pulmonary/Chest: He has no rales.  Abdominal: Soft.  Musculoskeletal: He exhibits no edema.  Neurological: He is alert and oriented to person, place, and time.  Skin: Skin is warm and dry.    ASSESSMENT & PLAN:    1.  Chest pain, unspecified type -  He presents with some atypical right-sided chest discomfort.  It seems to be with most activities.  He has also noted higher than normal blood pressures recently.  He has not undergone stress testing.  -Arrange exercise nuclear stress test  2.  Essential hypertension Blood pressure is above target.  He is on maximum dose olmesartan/HCTZ.  -TSH, BMET  -Add amlodipine 5 mg daily  3.  Permanent atrial fibrillation (HCC) Rate is controlled.  He remains on Coumadin.   Dispo:  Return in about 2 weeks (around 02/26/2018) for Follow up after testing, w/ Daniel Dopp, PA-C.   Medication Adjustments/Labs and Tests Ordered: Current medicines are reviewed at length with the patient today.  Concerns regarding medicines are outlined above.  Tests Ordered: Orders Placed This Encounter  Procedures  . TSH  . Basic  metabolic panel  . MYOCARDIAL PERFUSION IMAGING  . EKG 12-Lead   Medication Changes: Meds ordered this encounter  Medications  . amLODipine (NORVASC) 5 MG tablet    Sig: Take 1 tablet (5 mg total) by mouth daily.    Dispense:  30 tablet    Refill:  1    Signed, Daniel Dopp, PA-C  02/12/2018 11:39 AM    [address], [place], [place]  [address] Phone: [phone]; Fax: [phone]

## 2018-02-13 ENCOUNTER — Telehealth (HOSPITAL_COMMUNITY): Payer: Self-pay | Admitting: *Deleted

## 2018-02-13 LAB — TSH: TSH: 2.97 u[IU]/mL (ref 0.450–4.500)

## 2018-02-13 LAB — BASIC METABOLIC PANEL
BUN/Creatinine Ratio: 20 (ref 10–24)
BUN: 26 mg/dL (ref 8–27)
CO2: 25 mmol/L (ref 20–29)
CREATININE: 1.33 mg/dL — AB (ref 0.76–1.27)
Calcium: 9.3 mg/dL (ref 8.6–10.2)
Chloride: 102 mmol/L (ref 96–106)
GFR calc Af Amer: 59 mL/min/{1.73_m2} — ABNORMAL LOW (ref >59)
GFR, EST NON AFRICAN AMERICAN: 51 mL/min/{1.73_m2} — AB (ref >59)
GLUCOSE: 85 mg/dL (ref 65–99)
Potassium: 4.1 mmol/L (ref 3.5–5.2)
Sodium: 142 mmol/L (ref 134–144)

## 2018-02-13 NOTE — Telephone Encounter (Signed)
Left message on voicemail in reference to upcoming appointment scheduled for  02/17/18 Phone number given for a call back so details instructions can be given.  Daniel Love

## 2018-02-17 ENCOUNTER — Ambulatory Visit (HOSPITAL_COMMUNITY): Payer: Medicare Other | Attending: Cardiovascular Disease

## 2018-02-17 DIAGNOSIS — R079 Chest pain, unspecified: Secondary | ICD-10-CM

## 2018-02-17 DIAGNOSIS — I10 Essential (primary) hypertension: Secondary | ICD-10-CM | POA: Insufficient documentation

## 2018-02-17 DIAGNOSIS — I4891 Unspecified atrial fibrillation: Secondary | ICD-10-CM | POA: Insufficient documentation

## 2018-02-17 MED ORDER — TECHNETIUM TC 99M TETROFOSMIN IV KIT
10.1000 | PACK | Freq: Once | INTRAVENOUS | Status: AC | PRN
  Administered 2018-02-17: 10.1 via INTRAVENOUS
  Filled 2018-02-17: qty 11

## 2018-02-17 MED ORDER — REGADENOSON 0.4 MG/5ML IV SOLN
0.4000 mg | Freq: Once | INTRAVENOUS | Status: AC
  Administered 2018-02-17: 0.4 mg via INTRAVENOUS

## 2018-02-17 MED ORDER — TECHNETIUM TC 99M TETROFOSMIN IV KIT
31.8000 | PACK | Freq: Once | INTRAVENOUS | Status: AC | PRN
  Administered 2018-02-17: 31.8 via INTRAVENOUS
  Filled 2018-02-17: qty 32

## 2018-02-18 ENCOUNTER — Telehealth: Payer: Self-pay | Admitting: *Deleted

## 2018-02-18 ENCOUNTER — Encounter: Payer: Self-pay | Admitting: Physician Assistant

## 2018-02-18 DIAGNOSIS — R972 Elevated prostate specific antigen [PSA]: Secondary | ICD-10-CM | POA: Diagnosis not present

## 2018-02-18 LAB — MYOCARDIAL PERFUSION IMAGING
CHL CUP RESTING HR STRESS: 80 {beats}/min
CSEPPHR: 91 {beats}/min
LV sys vol: 82 mL
LVDIAVOL: 140 mL (ref 62–150)
RATE: 0.32
SDS: 1
SRS: 4
SSS: 5
TID: 0.98

## 2018-02-18 NOTE — Telephone Encounter (Signed)
Left verbal message with pt's wife to have pt call me tomorrow to go over results and recommendations. I did not see a DPR for our office on file though there is a DPR for [place], we will need to have a DPR filled out for our office as well.

## 2018-02-18 NOTE — Telephone Encounter (Signed)
-----   Message from Liliane Shi, [place] sent at 02/18/2018  1:19 PM EST ----- Please call the patient. Stress test does not indicate signs of a blockage (no ischemia).  Heart function is low at 42%.  This is likely underestimated. PLAN:  1.  Arrange echocardiogram prior to follow-up to assess wall motion and EF. Please fax a copy of this study result to his PCP:  Chesley Noon, MD  Thanks! Richardson Dopp, PA-C    02/18/2018 1:15 PM

## 2018-02-19 ENCOUNTER — Telehealth: Payer: Self-pay

## 2018-02-19 ENCOUNTER — Telehealth: Payer: Self-pay | Admitting: Physician Assistant

## 2018-02-19 DIAGNOSIS — I4821 Permanent atrial fibrillation: Secondary | ICD-10-CM

## 2018-02-19 NOTE — Telephone Encounter (Signed)
-----   Message from Liliane Shi, [place] sent at 02/18/2018  1:19 PM EST ----- Please call the patient. Stress test does not indicate signs of a blockage (no ischemia).  Heart function is low at 42%.  This is likely underestimated. PLAN:  1.  Arrange echocardiogram prior to follow-up to assess wall motion and EF. Please fax a copy of this study result to his PCP:  Chesley Noon, MD  Thanks! Richardson Dopp, PA-C    02/18/2018 1:15 PM

## 2018-02-19 NOTE — Telephone Encounter (Signed)
Pt has been notified of Myoview results by phone with verbal understanding., Pt agreeable to echo to reassess EF. Echo scheduled for 02/24/18 @ 8:30 am. Pt thanked me for my call and my help today. I will fax a copy of results to PCP Dr. Melford Aase.

## 2018-02-19 NOTE — Telephone Encounter (Signed)
Patient calling, states that he is returning call. Patient will leave at 3pm

## 2018-02-19 NOTE — Telephone Encounter (Signed)
   Primary Cardiologist: Mertie Moores, MD  Chart reviewed as part of pre-operative protocol coverage. Given past medical history and time since last visit, based on ACC/AHA guidelines, Daniel Love would be at acceptable risk for the planned procedure without further cardiovascular testing. However will need to hold coumadin prior to biopsy. Will route to Pharm D for recommendations,  I will route this recommendation to the requesting party via Epic fax function and remove from pre-op pool.  Please call with questions.  Jory Sims DNP, ANP, AACC.   02/19/2018, 4:21 PM

## 2018-02-19 NOTE — Telephone Encounter (Signed)
   [facility] Pre-operative Risk Assessment    Request for surgical clearance:  1. What type of surgery is being performed? Prostate biopsy  2. When is this surgery scheduled? 03/04/2018  What type of clearance is required (medical clearance vs. Pharmacy clearance to hold med vs. Both)? Pharmacy clearance   3. Are there any medications that need to be held prior to surgery and how long? Warfarin 7 days prior to surgery   4. Practice name and name of physician performing surgery?  1. [name]  2. Dr. [name]   5. What is your office phone and fax number?  1. Phone [phone]  2. Fax [phone]  6. Anesthesia type (None, local, MAC, general) ? None specified     _________________________________________________________________   (provider comments below)

## 2018-02-19 NOTE — Telephone Encounter (Signed)
Patient with diagnosis of Afib on warfarin for anticoagulation.    Procedure: prostate biopsy Date of procedure: 03/04/18  CHADS2-VASc score of  3 (CHF, HTN, AGE, DM2, stroke/tia x 2, CAD, AGE, male)  Per office protocol, patient can hold warfarin for 5 days prior to procedure.   Patient will not need bridging with Lovenox (enoxaparin) around procedure.   Patient should restart warfarin on the evening of procedure or day after, at discretion of procedure MD.

## 2018-02-19 NOTE — Telephone Encounter (Signed)
Patient with diagnosis of atrial fibrillation  on warfarin for anticoagulation.    Procedure: prostate biopsy Date of procedure: 03/04/18  CHADS2-VASc score of  3 (HTN, AGE x 2)  CrCl 54.3 Platelet count 168  Per office protocol, patient can hold warfarin for 5 days prior to procedure.   Patient will not need bridging with Lovenox (enoxaparin) around procedure.  If not bridging, patient should restart warfarin on the evening of procedure or day after, at discretion of procedure MD

## 2018-02-19 NOTE — Telephone Encounter (Signed)
F/U call:  Patient returning call for results, patient will leave at 3pm

## 2018-02-19 NOTE — Telephone Encounter (Signed)
Left verbal message x 2 with pt's wife to have pt call the office so that I may go over test results and recommendations.

## 2018-02-19 NOTE — Telephone Encounter (Signed)
Faxed via EPIC to requesting party 

## 2018-02-24 ENCOUNTER — Telehealth: Payer: Self-pay | Admitting: *Deleted

## 2018-02-24 ENCOUNTER — Other Ambulatory Visit: Payer: Self-pay

## 2018-02-24 ENCOUNTER — Encounter: Payer: Self-pay | Admitting: Physician Assistant

## 2018-02-24 ENCOUNTER — Ambulatory Visit (HOSPITAL_COMMUNITY): Payer: Medicare Other | Attending: Cardiovascular Disease

## 2018-02-24 DIAGNOSIS — R079 Chest pain, unspecified: Secondary | ICD-10-CM | POA: Diagnosis not present

## 2018-02-24 DIAGNOSIS — I081 Rheumatic disorders of both mitral and tricuspid valves: Secondary | ICD-10-CM | POA: Diagnosis not present

## 2018-02-24 DIAGNOSIS — I10 Essential (primary) hypertension: Secondary | ICD-10-CM | POA: Diagnosis not present

## 2018-02-24 DIAGNOSIS — I517 Cardiomegaly: Secondary | ICD-10-CM | POA: Diagnosis not present

## 2018-02-24 DIAGNOSIS — I482 Chronic atrial fibrillation: Secondary | ICD-10-CM

## 2018-02-24 DIAGNOSIS — Z87891 Personal history of nicotine dependence: Secondary | ICD-10-CM | POA: Insufficient documentation

## 2018-02-24 DIAGNOSIS — I4821 Permanent atrial fibrillation: Secondary | ICD-10-CM

## 2018-02-24 NOTE — Telephone Encounter (Signed)
-----   Message from Liliane Shi, [place] sent at 02/24/2018  2:54 PM EST ----- Please call the patient. The echo demonstrates normal heart function (EF).  This is better than what was demonstrated on the nuclear stress test.  An echocardiogram is more accurate to determine ejection fraction.  There is some leakage of the mitral valve.  Keep follow-up this week to review further. Please fax a copy of this study result to his PCP:  Chesley Noon, MD  Thanks! Richardson Dopp, PA-C    02/24/2018 2:47 PM

## 2018-02-24 NOTE — Telephone Encounter (Signed)
I s/w pt's wife and asked if pt could please call me to go over echo results. Pt's wife states pt will be after 4:30 . Advised I will call back then. Pt does have appt 2/27 with Brynda Rim. PA.

## 2018-02-26 ENCOUNTER — Encounter: Payer: Self-pay | Admitting: Physician Assistant

## 2018-02-26 ENCOUNTER — Ambulatory Visit (INDEPENDENT_AMBULATORY_CARE_PROVIDER_SITE_OTHER): Payer: Medicare Other | Admitting: Physician Assistant

## 2018-02-26 VITALS — BP 120/80 | HR 78 | Ht 71.0 in | Wt 181.4 lb

## 2018-02-26 DIAGNOSIS — I340 Nonrheumatic mitral (valve) insufficiency: Secondary | ICD-10-CM

## 2018-02-26 DIAGNOSIS — I4821 Permanent atrial fibrillation: Secondary | ICD-10-CM

## 2018-02-26 DIAGNOSIS — I482 Chronic atrial fibrillation: Secondary | ICD-10-CM

## 2018-02-26 DIAGNOSIS — I10 Essential (primary) hypertension: Secondary | ICD-10-CM

## 2018-02-26 NOTE — Patient Instructions (Signed)
Medication Instructions:  No changes.  It is ok if you want to start taking the Amlodipine (Norvasc) in the evening.  Labwork: None   Testing/Procedures: None   Follow-Up: Mertie Moores, MD in 6 months.   Any Other Special Instructions Will Be Listed Below (If Applicable). If your blood pressure is > 130/80 most of the time, call us.  If you need a refill on your cardiac medications before your next appointment, please call your pharmacy.

## 2018-02-26 NOTE — Progress Notes (Signed)
Cardiology Office Note:    Date:  02/26/2018   ID:  Daniel Love, DOB 1940-01-06, MRN [id number]  PCP:  Daniel Noon, MD  Cardiologist:  Daniel Moores, MD   Referring MD: Daniel Noon, MD   Chief Complaint  Patient presents with  . Follow-up    blood pressure; review recent tests    History of Present Illness:    Daniel Love is a 78 y.o. male with permanent atrial fibrillation, status post prior cardioversion in 2011, hypertension, chronic anticoagulation with warfarin.  Last seen 02/12/18.  He noted worsening blood pressure over the last several months.  He also had some right shoulder discomfort that radiated to his chest.  I set him up for a stress test.  This demonstrated no significant ischemia.  EF was low at 42.  A follow-up echo demonstrated normal ejection fraction at 60-65% with moderate to severe mitral regurgitation.  Daniel Love returns for follow-up.  He is here alone.  His wife just had a Cardiac Catheterization and is scheduled for staged PCI.  He denies any chest pain or significant shortness of breath.  He denies syncope, paroxysmal nocturnal dyspnea, significant leg edema.  He denies any bleeding issues.  He is off Warfarin for a prostate biopsy soon.    Prior CV studies:   The following studies were reviewed today:  Echo 02/24/18 EF 60-65, normal wall motion, trivial AI, moderate to severe MR, mild LAE, mild RVE, mild RAE, mild to moderate TR, PASP 35  Nuclear stress test 02/17/18 1. EF 42%, diffuse hypokinesis.  2. Fixed small, mild apical lateral perfusion defect.  Possible prior infarction, no evidence for significant ischemia. Intermediate risk  Echo 10/06/09 EF 55-60, mild LVH, mild BAE, mild aortic sclerosis, mild AI, mild MR, mild TR, RVSP 40  Echo 05/18/08 EF 55-60, mild LVH, diastolic dysfunction, mild aortic sclerosis, mild MR, RVSP 34  Past Medical History:  Diagnosis Date  . Anal fissure    hx of   . Atrial fibrillation (HCC)    maintaining normal sinus rhythm  . Cancer ([place])    skin cancer on arm  . Chronic anticoagulation    coumadin  . Dyspepsia   . GERD (gastroesophageal reflux disease)   . Glaucoma   . History of colon polyps 2012   Colonoscopy-Dr. Benson [place]   . History of echocardiogram    Echocardiogram 2/19: EF 60-65, normal wall motion, trivial AI, moderate to severe MR, mild LAE, mild RAE, mild to moderate TR, PASP 35  . History of kidney stones   . History of nuclear stress test    Myoview 2/19: EF 42, diffuse HK no significant ischemia, apical lateral scar  . Hypertension     Past Surgical History:  Procedure Laterality Date  . BACK SURGERY  12/31/1977   for degenerative disc disease  . COLONOSCOPY    . PROSTATE BIOPSY    . UPPER GASTROINTESTINAL ENDOSCOPY      Current Medications: Current Meds  Medication Sig  . amLODipine (NORVASC) 5 MG tablet Take 1 tablet (5 mg total) by mouth daily.  . colchicine 0.6 MG tablet as needed.  [name] LUMIGAN 0.01 % SOLN Place 1 drop into both eyes daily.  . Methylcellulose, Laxative, (CITRUCEL PO) Take by mouth as needed (for constipation).   . Multiple Vitamins-Minerals (CENTRUM SILVER PO) Take 1 tablet by mouth every morning.    . nystatin cream (MYCOSTATIN) Apply 1 application topically as needed (RASH).   [name] olmesartan-hydrochlorothiazide (BENICAR HCT) 40-25 MG  tablet TAKE ONE TABLET BY MOUTH DAILY  . omeprazole (PRILOSEC) 40 MG capsule Take 40 mg by mouth daily as needed.   . warfarin (COUMADIN) 5 MG tablet TAKE AS DIRECTED BY COUMADIN CLINIC   Current Facility-Administered Medications for the 02/26/18 encounter (Office Visit) with Daniel Dopp T, PA-C  Medication  . 0.9 %  sodium chloride infusion     Allergies:   Amlodipine and Lisinopril   Social History   Tobacco Use  . Smoking status: Former Smoker    Types: Cigarettes    Last attempt to quit: 12/31/1966    Years since quitting: 51.1  . Smokeless tobacco: Never Used  Substance Use Topics  .  Alcohol use: Yes    Alcohol/week: 4.2 oz    Types: 7 Glasses of wine per week    Comment: 1 daily   . Drug use: No     Family Hx: The patient's family history includes Breast cancer in his mother; Cancer in his father and mother; Colon cancer in his maternal grandmother; Heart attack in his father; Heart failure in his father. There is no history of Stroke, Esophageal cancer, Rectal cancer, or Stomach cancer.  ROS:   Please see the history of present illness.    Review of Systems  Cardiovascular: Positive for leg swelling.   All other systems reviewed and are negative.   EKGs/Labs/Other Test Reviewed:    EKG:  EKG is  ordered today.  The ekg ordered today demonstrates atrial fibrillation, HR 73  Recent Labs: 08/30/2017: Hemoglobin 14.8; Platelets 168 02/12/2018: BUN 26; Creatinine, Ser 1.33; Potassium 4.1; Sodium 142; TSH 2.970   Recent Lipid Panel No results found for: CHOL, TRIG, HDL, CHOLHDL, LDLCALC, LDLDIRECT  Physical Exam:    VS:  BP 120/80   Pulse 78   Ht 5\' 11"  (1.803 m)   Wt 181 lb 6.4 oz (82.3 kg)   SpO2 97%   BMI 25.30 kg/m     Wt Readings from Last 3 Encounters:  02/26/18 181 lb 6.4 oz (82.3 kg)  02/17/18 182 lb (82.6 kg)  02/12/18 182 lb 6.4 oz (82.7 kg)     Physical Exam  Constitutional: He is oriented to person, place, and time. He appears well-developed and well-nourished. No distress.  HENT:  Head: Normocephalic and atraumatic.  Neck: No JVD present.  Cardiovascular: Normal rate. An irregularly irregular rhythm present.  No murmur heard. Pulmonary/Chest: Effort normal. He has no rales.  Abdominal: Soft.  Musculoskeletal: He exhibits no edema.  Neurological: He is alert and oriented to person, place, and time.  Skin: Skin is warm and dry.    ASSESSMENT & PLAN:    1.  Permanent atrial fibrillation (HCC) Heart rate controlled.  He is maintained on warfarin.  This is currently on hold for upcoming prostate biopsy.  2.  Essential  hypertension The patient's blood pressure is controlled on his current regimen.  Continue current therapy.   3.  Mitral valve insufficiency, unspecified etiology Recent stress test without significant ischemia.  EF is normal by echo.  Moderate to severe mitral regurgitation noted on recent echocardiogram.  However, I cannot detect a murmur on exam.  He is not having any shortness of breath to suggest symptomatic mitral regurgitation.  He will need a follow-up echo in 1 year.   Dispo:  Return in about 6 months (around 08/26/2018) for Routine Follow Up, w/ Dr. Acie Fredrickson.   Medication Adjustments/Labs and Tests Ordered: Current medicines are reviewed at length with the patient today.  Concerns regarding medicines are outlined above.  Tests Ordered: Orders Placed This Encounter  Procedures  . EKG 12-Lead   Medication Changes: No orders of the defined types were placed in this encounter.   Signed, Daniel Dopp, PA-C  02/26/2018 12:05 PM    [facility] [place], [place], [place]  [address] Phone: [phone]; Fax: [phone]

## 2018-03-04 DIAGNOSIS — R972 Elevated prostate specific antigen [PSA]: Secondary | ICD-10-CM | POA: Diagnosis not present

## 2018-03-04 DIAGNOSIS — D075 Carcinoma in situ of prostate: Secondary | ICD-10-CM | POA: Diagnosis not present

## 2018-03-12 ENCOUNTER — Ambulatory Visit (INDEPENDENT_AMBULATORY_CARE_PROVIDER_SITE_OTHER): Payer: Medicare Other | Admitting: *Deleted

## 2018-03-12 DIAGNOSIS — I4891 Unspecified atrial fibrillation: Secondary | ICD-10-CM | POA: Diagnosis not present

## 2018-03-12 DIAGNOSIS — I4821 Permanent atrial fibrillation: Secondary | ICD-10-CM

## 2018-03-12 DIAGNOSIS — Z7901 Long term (current) use of anticoagulants: Secondary | ICD-10-CM

## 2018-03-12 LAB — POCT INR: INR: 1.1

## 2018-03-12 NOTE — Patient Instructions (Signed)
Description   When gets home today take another 1/2 tablet of coumadin since has already taken 1 and 1/2 tablets and tomorrow take 1.5 tablets then continue taking same dosage 1 tablet daily except 1.5 tablets on Sundays and Wednesdays. Recheck in 1 week  Call us with any update or changes [phone].

## 2018-03-13 DIAGNOSIS — C61 Malignant neoplasm of prostate: Secondary | ICD-10-CM | POA: Diagnosis not present

## 2018-03-14 ENCOUNTER — Encounter: Payer: Self-pay | Admitting: Radiation Oncology

## 2018-03-14 ENCOUNTER — Other Ambulatory Visit: Payer: Self-pay | Admitting: Urology

## 2018-03-14 DIAGNOSIS — C61 Malignant neoplasm of prostate: Secondary | ICD-10-CM

## 2018-03-18 ENCOUNTER — Ambulatory Visit (INDEPENDENT_AMBULATORY_CARE_PROVIDER_SITE_OTHER): Payer: Medicare Other | Admitting: *Deleted

## 2018-03-18 DIAGNOSIS — I4891 Unspecified atrial fibrillation: Secondary | ICD-10-CM

## 2018-03-18 DIAGNOSIS — Z7901 Long term (current) use of anticoagulants: Secondary | ICD-10-CM | POA: Diagnosis not present

## 2018-03-18 DIAGNOSIS — I4821 Permanent atrial fibrillation: Secondary | ICD-10-CM

## 2018-03-18 LAB — POCT INR: INR: 1.9

## 2018-03-18 NOTE — Patient Instructions (Signed)
Description   Today take an extra 1/2 tablet, then continue taking same dosage 1 tablet daily except 1.5 tablets on Sundays and Wednesdays. Recheck in 2 weeks.   Call us with any update or changes [phone].

## 2018-03-28 ENCOUNTER — Encounter (HOSPITAL_COMMUNITY)
Admission: RE | Admit: 2018-03-28 | Discharge: 2018-03-28 | Disposition: A | Discharge status: Home / Self Care | Payer: Medicare Other | Source: Ambulatory Visit | Attending: Urology | Admitting: Urology

## 2018-03-28 DIAGNOSIS — C61 Malignant neoplasm of prostate: Secondary | ICD-10-CM

## 2018-03-28 DIAGNOSIS — N400 Enlarged prostate without lower urinary tract symptoms: Secondary | ICD-10-CM | POA: Diagnosis not present

## 2018-03-28 DIAGNOSIS — R972 Elevated prostate specific antigen [PSA]: Secondary | ICD-10-CM | POA: Diagnosis not present

## 2018-03-28 MED ORDER — TECHNETIUM TC 99M MEDRONATE IV KIT
25.0000 | PACK | Freq: Once | INTRAVENOUS | Status: AC | PRN
  Administered 2018-03-28: 20.4 via INTRAVENOUS

## 2018-03-29 ENCOUNTER — Other Ambulatory Visit: Payer: Self-pay | Admitting: Cardiovascular Disease

## 2018-04-01 ENCOUNTER — Encounter (INDEPENDENT_AMBULATORY_CARE_PROVIDER_SITE_OTHER): Payer: Self-pay

## 2018-04-01 ENCOUNTER — Ambulatory Visit (INDEPENDENT_AMBULATORY_CARE_PROVIDER_SITE_OTHER): Payer: Medicare Other | Admitting: *Deleted

## 2018-04-01 DIAGNOSIS — I4821 Permanent atrial fibrillation: Secondary | ICD-10-CM

## 2018-04-01 DIAGNOSIS — I482 Chronic atrial fibrillation: Secondary | ICD-10-CM | POA: Diagnosis not present

## 2018-04-01 DIAGNOSIS — Z7901 Long term (current) use of anticoagulants: Secondary | ICD-10-CM | POA: Diagnosis not present

## 2018-04-01 LAB — POCT INR: INR: 2.6

## 2018-04-01 NOTE — Patient Instructions (Signed)
Description   Continue taking same dosage 1 tablet daily except 1.5 tablets on Sundays and Wednesdays. Recheck in 4 weeks.   Call us with any update or changes [phone].

## 2018-04-08 ENCOUNTER — Encounter: Payer: Self-pay | Admitting: Radiation Oncology

## 2018-04-08 ENCOUNTER — Other Ambulatory Visit: Payer: Self-pay | Admitting: Physician Assistant

## 2018-04-08 NOTE — Progress Notes (Signed)
GU Location of Tumor / Histology: prostatic adenocarcinoma  If Prostate Cancer, Gleason Score is (4 + 5) and PSA is (16.20). Prostate volume: 100 grams.  02/12/2018 PSA 16.20 08/13/2017 PSA 10.10 04/2016  PSA 7.9 10/2015 PSA 6.35 05/2015  PSA 6.0 03/2015  PSA 6.78  Biopsies of prostate (if applicable) revealed:    Past/Anticipated interventions by urology, if any: prostate biopsy no.???, pelvic CT (negative), bone scan (negative), referral to radiation oncology  Past/Anticipated interventions by medical oncology, if any: no  Weight changes, if any: 5-6 lb in the last two weeks but he was started on Norvasc recently and his wife was discharged from the hospital on Monday  Bowel/Bladder complaints, if any: Denies dysuria. Denies hematuria. Reports occasional leakage.    Nausea/Vomiting, if any: no  Pain issues, if any:  Reports low back pain which he believes is related to kidney stones  SAFETY ISSUES:  Prior radiation? no  Pacemaker/ICD? no  Possible current pregnancy? no  Is the patient on methotrexate? no  Current Complaints / other details:  78 year old male. 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 They share three daughters all but one lives local. Retired. Former smoker.

## 2018-04-09 ENCOUNTER — Ambulatory Visit
Admission: RE | Admit: 2018-04-09 | Discharge: 2018-04-09 | Disposition: A | Discharge status: Home / Self Care | Payer: Medicare Other | Source: Ambulatory Visit | Attending: Radiation Oncology | Admitting: Radiation Oncology

## 2018-04-09 ENCOUNTER — Encounter: Payer: Self-pay | Admitting: Radiation Oncology

## 2018-04-09 ENCOUNTER — Other Ambulatory Visit: Payer: Self-pay

## 2018-04-09 DIAGNOSIS — Z86010 Personal history of colonic polyps: Secondary | ICD-10-CM | POA: Diagnosis not present

## 2018-04-09 DIAGNOSIS — Z7901 Long term (current) use of anticoagulants: Secondary | ICD-10-CM | POA: Insufficient documentation

## 2018-04-09 DIAGNOSIS — C61 Malignant neoplasm of prostate: Secondary | ICD-10-CM | POA: Diagnosis not present

## 2018-04-09 DIAGNOSIS — Z85828 Personal history of other malignant neoplasm of skin: Secondary | ICD-10-CM | POA: Insufficient documentation

## 2018-04-09 DIAGNOSIS — I4891 Unspecified atrial fibrillation: Secondary | ICD-10-CM | POA: Insufficient documentation

## 2018-04-09 DIAGNOSIS — I10 Essential (primary) hypertension: Secondary | ICD-10-CM | POA: Insufficient documentation

## 2018-04-09 DIAGNOSIS — Z87891 Personal history of nicotine dependence: Secondary | ICD-10-CM | POA: Diagnosis not present

## 2018-04-09 DIAGNOSIS — Z87442 Personal history of urinary calculi: Secondary | ICD-10-CM | POA: Insufficient documentation

## 2018-04-09 DIAGNOSIS — K219 Gastro-esophageal reflux disease without esophagitis: Secondary | ICD-10-CM | POA: Diagnosis not present

## 2018-04-09 DIAGNOSIS — H409 Unspecified glaucoma: Secondary | ICD-10-CM | POA: Diagnosis not present

## 2018-04-09 DIAGNOSIS — N401 Enlarged prostate with lower urinary tract symptoms: Secondary | ICD-10-CM | POA: Diagnosis not present

## 2018-04-09 DIAGNOSIS — Z79899 Other long term (current) drug therapy: Secondary | ICD-10-CM | POA: Diagnosis not present

## 2018-04-09 DIAGNOSIS — Z888 Allergy status to other drugs, medicaments and biological substances status: Secondary | ICD-10-CM | POA: Diagnosis not present

## 2018-04-09 DIAGNOSIS — R972 Elevated prostate specific antigen [PSA]: Secondary | ICD-10-CM | POA: Diagnosis not present

## 2018-04-09 HISTORY — DX: Unspecified malignant neoplasm of skin, unspecified: C44.90

## 2018-04-09 HISTORY — DX: Malignant neoplasm of prostate: C61

## 2018-04-09 NOTE — Progress Notes (Signed)
Radiation Oncology         [phone] ________________________________  Initial Outpatient Consultation  Name: Daniel Love MRN: [id number]  Date: 04/09/2018  DOB: 11-11-1940  CC:Daniel Noon, MD  Daniel Hughs, MD   REFERRING PHYSICIAN: Ardis Hughs, MD  DIAGNOSIS: 78 y.o. gentleman with Stage T1c adenocarcinoma of the prostate with Gleason Score of 4+5, and PSA of 16.20    ICD-10-CM   1. Malignant neoplasm of prostate [facility] [place] [facility] referral to Social Work    HISTORY OF PRESENT ILLNESS: Daniel Love is a 78 y.o. male with a diagnosis of prostate cancer.  He is a well-established urology patient of Dr. Carlton Love with a long-standing history of BPH with elevated PSA.  His PSA has been steadily rising over the past 3 years.  PSA was 7.9 in 04/2016.  A TRUSPBx was performed on 08/22/16 for further evaluation and was negative.  PSA increased to 10.10 in 01/2017.  MRI prostate was performed 03/22/17 for further evaluation and showed no evidence of high-grade carcinoma or definite evidence of prostate cancer.  The appearance was more of BPH, and there was no evidence of extraprostatic extension.  Repeat PSA on 08/13/2017 was 11.2, and DRE on 08/20/17 revealed symmetrical lobes without discrete nodularity. The PSA further increased to 16.20 on 02/12/18. The patient proceeded to transrectal ultrasound with saturation biopsies of the prostate on 03/04/18.  The prostate volume measured 100 cc.  Out of 30 core biopsies, 2 were positive.  The maximum Gleason score was 4+5, and this was seen in the left base and right base.    CT pelvis and bone scan were performed on 03/28/18 for disease staging.  There was no lymphadenopathy or evidence of metastatic disease in the pelvis on CT scan, and bone scan revealed no evidence of osseous metastatic disease.  The patient reviewed the biopsy results with his urologist and he has kindly been referred today for discussion of potential  radiation treatment options. He is accompanied by his wife and two of their three daughters.   PREVIOUS RADIATION THERAPY: No  PAST MEDICAL HISTORY:  Past Medical History:  Diagnosis Date  . Anal fissure    hx of   . Atrial fibrillation (HCC)    maintaining normal sinus rhythm  . Chronic anticoagulation    coumadin  . Dyspepsia   . GERD (gastroesophageal reflux disease)   . Glaucoma   . History of colon polyps 2012   Colonoscopy-Dr. Benson Love   . History of echocardiogram    Echocardiogram 2/19: EF 60-65, normal wall motion, trivial AI, moderate to severe MR, mild LAE, mild RAE, mild to moderate TR, PASP 35  . History of kidney stones   . History of nuclear stress test    Myoview 2/19: EF 42, diffuse HK no significant ischemia, apical lateral scar  . Hypertension   . Prostate cancer (Daniel Love)   . Skin cancer    skin cancer removed from left arm and back      PAST SURGICAL HISTORY: Past Surgical History:  Procedure Laterality Date  . BACK SURGERY  12/31/1977   for degenerative disc disease  . COLONOSCOPY    . PROSTATE BIOPSY    . UPPER GASTROINTESTINAL ENDOSCOPY      FAMILY HISTORY:  Family History  Problem Relation Age of Onset  . Heart failure Father   . Cancer Father        associated with her lungs  . Heart attack Father   . Cancer Mother  uncertain/possibly lung  . Colon cancer Maternal Grandmother   . Stroke Neg Hx   . Esophageal cancer Neg Hx   . Rectal cancer Neg Hx   . Stomach cancer Neg Hx     SOCIAL HISTORY:  Social History   Socioeconomic History  . Marital status: Married    Spouse name: Not on file  . Number of children: 3  . Years of education: Not on file  . Highest education level: Not on file  Occupational History  . Occupation: Retired  Scientific laboratory technician  . Financial resource strain: Not on file  . Food insecurity:    Worry: Not on file    Inability: Not on file  . Transportation needs:    Medical: Not on file    Non-medical: Not on  file  Tobacco Use  . Smoking status: Former Smoker    Packs/day: 1.00    Years: 7.00    Pack years: 7.00    Types: Cigarettes    Last attempt to quit: 12/31/1966    Years since quitting: 51.3  . Smokeless tobacco: Never Used  Substance and Sexual Activity  . Alcohol use: Yes    Alcohol/week: 4.2 oz    Types: 7 Glasses of wine per week    Comment: 1 daily   . Drug use: No  . Sexual activity: Not Currently  Lifestyle  . Physical activity:    Days per week: Not on file    Minutes per session: Not on file  . Stress: Not on file  Relationships  . Social connections:    Talks on phone: Not on file    Gets together: Not on file    Attends religious service: Not on file    Active member of club or organization: Not on file    Attends meetings of clubs or organizations: Not on file    Relationship status: Not on file  . Intimate partner violence:    Fear of current or ex partner: Not on file    Emotionally abused: Not on file    Physically abused: Not on file    Forced sexual activity: Not on file  Other Topics Concern  . 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 They share three daughters, two live local, one in [place].    ALLERGIES: Amlodipine and Lisinopril  MEDICATIONS:  Current Outpatient Medications  Medication Sig Dispense Refill  . amLODipine (NORVASC) 5 MG tablet TAKE ONE TABLET BY MOUTH DAILY 90 tablet 3  . amLODIPine Besylate (NORVASC PO) Take by mouth.    . LUMIGAN 0.01 % SOLN Place 1 drop into both eyes daily.    . Methylcellulose, Laxative, (CITRUCEL PO) Take by mouth as needed (for constipation).     . Multiple Vitamins-Minerals (CENTRUM SILVER PO) Take 1 tablet by mouth every morning.      . nystatin cream (MYCOSTATIN) Apply 1 application topically as needed (RASH).     [name] olmesartan-hydrochlorothiazide (BENICAR HCT) 40-25 MG tablet TAKE ONE TABLET BY MOUTH DAILY 90 tablet 3  . colchicine 0.6 MG  tablet as needed.    [name] omeprazole (PRILOSEC) 40 MG capsule Take 40 mg by mouth daily as needed.     . warfarin (COUMADIN) 5 MG tablet TAKE AS DIRECTED BY COUMADIN CLINIC (Patient taking differently: TAKE AS DIRECTED BY COUMADIN CLINIC/5 mg for 5 days for week then, 7.5 mg for 2 days) 40 tablet 2  Current Facility-Administered Medications  Medication Dose Route Frequency Provider Last Rate Last Dose  . 0.9 %  sodium chloride infusion  500 mL Intravenous Continuous Milus Banister, MD        REVIEW OF SYSTEMS:  On review of systems, the patient reports that he is doing well overall. He denies any chest pain, shortness of breath, cough, fevers, chills, or night sweats. He reports a 5-6 pound weight loss in the past two weeks, but he was started on Norvasc recently, and his wife was discharged from the hospital two days ago. He denies any bowel disturbances, and denies abdominal pain, nausea or vomiting. He reports low back pain which he believes is related to kidney stones. His IPSS was 10, indicating moderate urinary symptoms of nocturia and urgency with occasional leakage. He denies any dysuria or hematuria. He is able to complete sexual activity almost never. A complete review of systems is obtained and is otherwise negative.    PHYSICAL EXAM:  Wt Readings from Last 3 Encounters:  04/09/18 179 lb 3.2 oz (81.3 kg)  02/26/18 181 lb 6.4 oz (82.3 kg)  02/17/18 182 lb (82.6 kg)   Temp Readings from Last 3 Encounters:  04/09/18 97.6 F (36.4 C) (Oral)  10/02/16 98 F (36.7 C)  01/09/12 97.1 F (36.2 C)   BP Readings from Last 3 Encounters:  04/09/18 140/83  02/26/18 120/80  02/12/18 (!) 150/80   Pulse Readings from Last 3 Encounters:  04/09/18 82  02/26/18 78  02/12/18 80    /10  In general this is a well appearing caucasian male in no acute distress. He is alert and oriented x4 and appropriate throughout the examination. HEENT reveals that the patient is normocephalic, atraumatic.  EOMs are intact. PERRLA. Skin is intact without any evidence of gross lesions. Cardiovascular exam reveals a regular rate and rhythm, no clicks rubs or murmurs are auscultated. Chest is clear to auscultation bilaterally. Lymphatic assessment is performed and does not reveal any adenopathy in the cervical, supraclavicular, axillary, or inguinal chains. Abdomen has active bowel sounds in all quadrants and is intact. The abdomen is soft, non tender, non distended. Lower extremities are negative for pretibial pitting edema, deep calf tenderness, cyanosis or clubbing.   KPS = 100  100 - Normal; no complaints; no evidence of disease. 90   - Able to carry on normal activity; minor signs or symptoms of disease. 80   - Normal activity with effort; some signs or symptoms of disease. 67   - Cares for self; unable to carry on normal activity or to do active work. 60   - Requires occasional assistance, but is able to care for most of his personal needs. 50   - Requires considerable assistance and frequent medical care. 46   - Disabled; requires special care and assistance. 24   - Severely disabled; hospital admission is indicated although death not imminent. 39   - Very sick; hospital admission necessary; active supportive treatment necessary. 10   - Moribund; fatal processes progressing rapidly. 0     - Dead  Karnofsky DA, Abelmann [place], Craver LS and Burchenal [facility] [phone]) The use of the nitrogen mustards in the palliative treatment of carcinoma: with particular reference to bronchogenic carcinoma Cancer 1 634-56  LABORATORY DATA:  Lab Results  Component Value Date   WBC 6.6 08/30/2017   HGB 14.8 08/30/2017   HCT 45.2 08/30/2017   MCV 92 08/30/2017   PLT 168 08/30/2017   Lab Results  Component Value Date   NA 142 02/12/2018   K 4.1 02/12/2018   CL 102 02/12/2018   CO2 25 02/12/2018   No results found for: ALT, AST, GGT, ALKPHOS, BILITOT   RADIOGRAPHY: Nm Bone Scan Whole Body  Result Date:  03/28/2018 CLINICAL DATA:  History of prostate cancer. EXAM: NUCLEAR MEDICINE WHOLE BODY BONE SCAN TECHNIQUE: Whole body anterior and posterior images were obtained approximately 3 hours after intravenous injection of radiopharmaceutical. RADIOPHARMACEUTICALS:  20.4 mCi Technetium-76m MDP IV COMPARISON:  None. FINDINGS: Abnormal uptake is seen involving both feet most consistent with degenerative change. Abnormal uptake is seen in the right knee also consistent with degenerative change. No other areas of abnormal uptake are noted to suggest metastatic disease. IMPRESSION: No scintigraphic evidence of osseous metastases. Electronically Signed   By: Marijo Conception, M.D.   On: 03/28/2018 15:13      IMPRESSION/PLAN: 1. 78 y.o. gentleman with Stage T1c adenocarcinoma of the prostate with Gleason Score of 4+5, and PSA of 16.20. We discussed the patient's workup and outlined the nature of prostate cancer in this setting. The patient's T stage, Gleason's score, and PSA put him into the high risk group. Accordingly, he is eligible for a variety of potential treatment options including LT-ADT in combination with either 8 weeks of external radiation or 5 weeks of external radiation combined with a brachytherapy boost. We discussed the available radiation techniques, and focused on the details and logistics and delivery. The patient is not an ideal candidate for brachytherapy boost with a prostate volume of 100 gm prior to downsizing from hormone therapy. We discussed and outlined the risks, benefits, short and long-term effects associated with radiotherapy and compared and contrasted these with prostatectomy. We discussed the role of SpaceOAR in reducing the rectal toxicity associated with radiotherapy. We also detailed the role of ADT in the treatment of high risk prostate cancer and outlined the associated side effects that could be expected with this therapy.  We also answered multiple questions related to HiFU  therapy for prostate cancer.  At the end of the conversation the patient remains undecided regarding his final treatment decision. He is still considering HIFU and will call within the next 1-2 weeks with his final decision. If he decides to pursue radiotherapy, we will coordinate a follow-up visit with Dr. [name] to start long-term ADT immediately as well as coordinate for the patient to have gold markers and SpaceOAR placed in the outpatient setting within the next 4-6 weeks, prior to [name]. We would anticipate beginning radiotherapy approximately 8 weeks after the start of ADT.  We spent more than 50% of today's time face to face with the patient in counseling and/or coordination of care.   Nicholos Johns, PA-C    Tyler Pita, MD  [facility] Direct Dial: [phone]  Fax: [phone] [email or web address]  Skype  LinkedIn    Page Me   This document serves as a record of services personally performed by Tyler Pita, MD and Freeman Caldron, PA-C. It was created on their behalf by Rae Lips, a trained medical scribe. The creation of this record is based on the scribe's personal observations and the providers' statements to them. This document has been checked and approved by the attending providers.

## 2018-04-09 NOTE — Progress Notes (Signed)
See progress note under physician encounter. 

## 2018-04-10 ENCOUNTER — Encounter: Payer: Self-pay | Admitting: General Practice

## 2018-04-10 ENCOUNTER — Other Ambulatory Visit: Payer: Self-pay | Admitting: Cardiovascular Disease

## 2018-04-10 NOTE — Progress Notes (Signed)
[place] Psychosocial Distress Screening Clinical Social Work  Clinical Social Work was referred by distress screening protocol.  The patient scored a 8 on the Psychosocial Distress Thermometer which indicates moderate distress. Clinical Social Worker Edwyna Shell to assess for distress and other psychosocial needs. Reached patient by phone, states he is doing "well" after appointment, "Ill probably call you next week w a decision", leaning towards proceeding w treatment.  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  No immediate needs, CSW will mail information packet on [place] and encouraged patient to reach out as needed for support/resources.    ONCBCN DISTRESS SCREENING 04/09/2018  Screening Type Initial Screening  Distress experienced in past week (1-10) 8  Family Problem type Partner  Emotional problem type Nervousness/Anxiety  Information Concerns Type Lack of info about diagnosis  Physical Problem type Changes in urination  Physician notified of physical symptoms Yes  Referral to clinical psychology No  Referral to dietition No  Referral to financial advocate No  Referral to support programs No  Referral to palliative care No    Clinical Social Worker follow up needed: No.  If yes, follow up plan:   Edwyna Shell, LCSW Clinical Social Worker Phone:  [phone]

## 2018-04-15 ENCOUNTER — Telehealth: Payer: Self-pay | Admitting: Urology

## 2018-04-15 ENCOUNTER — Telehealth: Payer: Self-pay | Admitting: Medical Oncology

## 2018-04-15 NOTE — Telephone Encounter (Signed)
I spoke with Mr. Daniel Love today regarding his treatment preference.  At this point, he reports that he would like to proceed with starting LT-ADT in combination with 8 weeks of radiotherapy.  He is scheduled for his first ADT injection next Wednesday, April 23, 2018 with Dr. [name].  I advised him that we would proceed with coordinating for fiducial marker and SpaceOAR placement in the outpatient surgical setting within the next 4-6 weeks followed by CT simulation in preparation for beginning radiotherapy in early-mid June 2019.  He appears to have a good understanding of this plan and is in agreement.   Nicholos Johns, PA-C

## 2018-04-15 NOTE — Telephone Encounter (Signed)
Called Mr. Laperle to introduce myself as the prostate navigator and my role. I was unable to meet him the day he consulted with Dr. Tammi Klippel. He has chosen ADT with radiation. He is scheduled for ADT 04/23/18. He has not been scheduled for gold markers/[facility].I will continue to follow and asked him to call with questions or concerns.

## 2018-04-23 DIAGNOSIS — Z5111 Encounter for antineoplastic chemotherapy: Secondary | ICD-10-CM | POA: Diagnosis not present

## 2018-04-23 DIAGNOSIS — C61 Malignant neoplasm of prostate: Secondary | ICD-10-CM | POA: Diagnosis not present

## 2018-04-29 ENCOUNTER — Ambulatory Visit (INDEPENDENT_AMBULATORY_CARE_PROVIDER_SITE_OTHER): Payer: Medicare Other | Admitting: *Deleted

## 2018-04-29 DIAGNOSIS — Z7901 Long term (current) use of anticoagulants: Secondary | ICD-10-CM | POA: Diagnosis not present

## 2018-04-29 DIAGNOSIS — I482 Chronic atrial fibrillation: Secondary | ICD-10-CM | POA: Diagnosis not present

## 2018-04-29 DIAGNOSIS — I4821 Permanent atrial fibrillation: Secondary | ICD-10-CM

## 2018-04-29 LAB — POCT INR: INR: 2.9

## 2018-04-29 NOTE — Patient Instructions (Signed)
Description   Continue taking same dosage 1 tablet daily except 1.5 tablets on Sundays and Wednesdays. Recheck in 4 weeks.   Call us with any update or changes [phone].

## 2018-05-16 DIAGNOSIS — C61 Malignant neoplasm of prostate: Secondary | ICD-10-CM | POA: Diagnosis not present

## 2018-05-16 DIAGNOSIS — R300 Dysuria: Secondary | ICD-10-CM | POA: Diagnosis not present

## 2018-05-27 ENCOUNTER — Ambulatory Visit (INDEPENDENT_AMBULATORY_CARE_PROVIDER_SITE_OTHER): Payer: Medicare Other | Admitting: *Deleted

## 2018-05-27 DIAGNOSIS — Z7901 Long term (current) use of anticoagulants: Secondary | ICD-10-CM

## 2018-05-27 DIAGNOSIS — Z5181 Encounter for therapeutic drug level monitoring: Secondary | ICD-10-CM | POA: Diagnosis not present

## 2018-05-27 DIAGNOSIS — I482 Chronic atrial fibrillation: Secondary | ICD-10-CM

## 2018-05-27 DIAGNOSIS — I4821 Permanent atrial fibrillation: Secondary | ICD-10-CM

## 2018-05-27 LAB — POCT INR: INR: 3 (ref 2.0–3.0)

## 2018-05-27 NOTE — Patient Instructions (Signed)
Description   Continue taking same dosage 1 tablet daily except 1.5 tablets on Sundays and Wednesdays. Recheck in 4 weeks.   Call us with any update or changes [phone].

## 2018-06-09 ENCOUNTER — Telehealth: Payer: Self-pay | Admitting: *Deleted

## 2018-06-09 NOTE — Telephone Encounter (Signed)
Called patient to inform of sim appt. on 06-26-18 @ 1 pm @ Dr.Manning's [facility], lvm for a return call

## 2018-06-10 DIAGNOSIS — C61 Malignant neoplasm of prostate: Secondary | ICD-10-CM | POA: Diagnosis not present

## 2018-06-11 ENCOUNTER — Other Ambulatory Visit: Payer: Self-pay | Admitting: Urology

## 2018-06-11 DIAGNOSIS — C61 Malignant neoplasm of prostate: Secondary | ICD-10-CM

## 2018-06-24 ENCOUNTER — Ambulatory Visit (INDEPENDENT_AMBULATORY_CARE_PROVIDER_SITE_OTHER): Payer: Medicare Other

## 2018-06-24 DIAGNOSIS — Z7901 Long term (current) use of anticoagulants: Secondary | ICD-10-CM

## 2018-06-24 DIAGNOSIS — I4821 Permanent atrial fibrillation: Secondary | ICD-10-CM

## 2018-06-24 DIAGNOSIS — I482 Chronic atrial fibrillation: Secondary | ICD-10-CM

## 2018-06-24 LAB — POCT INR: INR: 1.6 — AB (ref 2.0–3.0)

## 2018-06-24 NOTE — Patient Instructions (Signed)
Description   Take 1.5 tablets today and 2 tablets tomorrow, then resume same dosage 1 tablet daily except 1.5 tablets on Sundays and Wednesdays. Recheck in 2 weeks.   Call us with any update or changes [phone].

## 2018-06-26 ENCOUNTER — Ambulatory Visit
Admission: RE | Admit: 2018-06-26 | Discharge: 2018-06-26 | Disposition: A | Discharge status: Home / Self Care | Payer: Medicare Other | Source: Ambulatory Visit | Attending: Radiation Oncology | Admitting: Radiation Oncology

## 2018-06-26 ENCOUNTER — Ambulatory Visit (HOSPITAL_COMMUNITY)
Admission: RE | Admit: 2018-06-26 | Discharge: 2018-06-26 | Disposition: A | Discharge status: Home / Self Care | Payer: Medicare Other | Source: Ambulatory Visit | Attending: Urology | Admitting: Urology

## 2018-06-26 ENCOUNTER — Encounter: Payer: Self-pay | Admitting: Medical Oncology

## 2018-06-26 DIAGNOSIS — C61 Malignant neoplasm of prostate: Secondary | ICD-10-CM | POA: Insufficient documentation

## 2018-06-26 NOTE — Progress Notes (Signed)
  Radiation Oncology         [phone]) [phone] ________________________________  Name: Daniel Love MRN: [id number]  Date: 06/26/2018  DOB: May 03, 1940  SIMULATION AND TREATMENT PLANNING NOTE    ICD-10-CM   1. Malignant neoplasm of prostate ([place]) C61     DIAGNOSIS:  78 y.o. gentleman with Stage T1c adenocarcinoma of the prostate with Gleason Score of 4+5, and PSA of 16.20.  NARRATIVE:  The patient was brought to the [facility].  Identity was confirmed.  All relevant records and images related to the planned course of therapy were reviewed.  The patient freely provided informed written consent to proceed with treatment after reviewing the details related to the planned course of therapy. The consent form was witnessed and verified by the simulation staff.  Then, the patient was set-up in a stable reproducible supine position for radiation therapy.  A vacuum lock pillow device was custom fabricated to position his legs in a reproducible immobilized position.  Then, I performed a urethrogram under sterile conditions to identify the prostatic apex.  CT images were obtained.  Surface markings were placed.  The CT images were loaded into the planning software.  Then the prostate target and avoidance structures including the rectum, bladder, bowel and hips were contoured.  Treatment planning then occurred.  The radiation prescription was entered and confirmed.  A total of one complex treatment devices were fabricated. I have requested : Intensity Modulated Radiotherapy (IMRT) is medically necessary for this case for the following reason:  Rectal sparing.[name]  PLAN:  The patient will receive 45 Gy in 25 fractions of 1.8 Gy, followed by a boost to the prostate to a total dose of 75 Gy with 15 additional fractions of 2.0 Gy.  ________________________________  Sheral Apley Tammi Klippel, M.D.  This document serves as a record of services personally performed by Tyler Pita, MD. It was created on  his behalf by Arlyce Harman, a trained medical scribe. The creation of this record is based on the scribe's personal observations and the provider's statements to them. This document has been checked and approved by the attending provider.

## 2018-07-01 DIAGNOSIS — C61 Malignant neoplasm of prostate: Secondary | ICD-10-CM | POA: Diagnosis not present

## 2018-07-01 DIAGNOSIS — Z510 Encounter for antineoplastic radiation therapy: Secondary | ICD-10-CM | POA: Diagnosis not present

## 2018-07-08 ENCOUNTER — Ambulatory Visit (INDEPENDENT_AMBULATORY_CARE_PROVIDER_SITE_OTHER): Payer: Medicare Other | Admitting: *Deleted

## 2018-07-08 ENCOUNTER — Ambulatory Visit
Admission: RE | Admit: 2018-07-08 | Discharge: 2018-07-08 | Disposition: A | Discharge status: Home / Self Care | Payer: Medicare Other | Source: Ambulatory Visit | Attending: Radiation Oncology | Admitting: Radiation Oncology

## 2018-07-08 DIAGNOSIS — Z7901 Long term (current) use of anticoagulants: Secondary | ICD-10-CM

## 2018-07-08 DIAGNOSIS — Z510 Encounter for antineoplastic radiation therapy: Secondary | ICD-10-CM | POA: Diagnosis not present

## 2018-07-08 DIAGNOSIS — I482 Chronic atrial fibrillation: Secondary | ICD-10-CM

## 2018-07-08 DIAGNOSIS — H4010X1 Unspecified open-angle glaucoma, mild stage: Secondary | ICD-10-CM | POA: Diagnosis not present

## 2018-07-08 DIAGNOSIS — I4821 Permanent atrial fibrillation: Secondary | ICD-10-CM

## 2018-07-08 DIAGNOSIS — C61 Malignant neoplasm of prostate: Secondary | ICD-10-CM | POA: Diagnosis not present

## 2018-07-08 LAB — POCT INR: INR: 2.5 (ref 2.0–3.0)

## 2018-07-08 NOTE — Patient Instructions (Signed)
Description   Continue taking your same dosage 1 tablet daily except 1.5 tablets on Sundays and Wednesdays. Recheck in 4 weeks. Call us with any update or changes #[phone].      

## 2018-07-09 ENCOUNTER — Ambulatory Visit
Admission: RE | Admit: 2018-07-09 | Discharge: 2018-07-09 | Disposition: A | Discharge status: Home / Self Care | Payer: Medicare Other | Source: Ambulatory Visit | Attending: Radiation Oncology | Admitting: Radiation Oncology

## 2018-07-09 DIAGNOSIS — C61 Malignant neoplasm of prostate: Secondary | ICD-10-CM | POA: Diagnosis not present

## 2018-07-09 DIAGNOSIS — Z510 Encounter for antineoplastic radiation therapy: Secondary | ICD-10-CM | POA: Diagnosis not present

## 2018-07-10 ENCOUNTER — Ambulatory Visit
Admission: RE | Admit: 2018-07-10 | Discharge: 2018-07-10 | Disposition: A | Discharge status: Home / Self Care | Payer: Medicare Other | Source: Ambulatory Visit | Attending: Radiation Oncology | Admitting: Radiation Oncology

## 2018-07-10 DIAGNOSIS — C61 Malignant neoplasm of prostate: Secondary | ICD-10-CM | POA: Diagnosis not present

## 2018-07-10 DIAGNOSIS — Z510 Encounter for antineoplastic radiation therapy: Secondary | ICD-10-CM | POA: Diagnosis not present

## 2018-07-11 ENCOUNTER — Ambulatory Visit
Admission: RE | Admit: 2018-07-11 | Discharge: 2018-07-11 | Disposition: A | Discharge status: Home / Self Care | Payer: Medicare Other | Source: Ambulatory Visit | Attending: Radiation Oncology | Admitting: Radiation Oncology

## 2018-07-11 DIAGNOSIS — C61 Malignant neoplasm of prostate: Secondary | ICD-10-CM | POA: Diagnosis not present

## 2018-07-11 DIAGNOSIS — Z510 Encounter for antineoplastic radiation therapy: Secondary | ICD-10-CM | POA: Diagnosis not present

## 2018-07-14 ENCOUNTER — Ambulatory Visit
Admission: RE | Admit: 2018-07-14 | Discharge: 2018-07-14 | Disposition: A | Discharge status: Home / Self Care | Payer: Medicare Other | Source: Ambulatory Visit | Attending: Radiation Oncology | Admitting: Radiation Oncology

## 2018-07-14 DIAGNOSIS — Z510 Encounter for antineoplastic radiation therapy: Secondary | ICD-10-CM | POA: Diagnosis not present

## 2018-07-14 DIAGNOSIS — C61 Malignant neoplasm of prostate: Secondary | ICD-10-CM | POA: Diagnosis not present

## 2018-07-15 ENCOUNTER — Ambulatory Visit
Admission: RE | Admit: 2018-07-15 | Discharge: 2018-07-15 | Disposition: A | Discharge status: Home / Self Care | Payer: Medicare Other | Source: Ambulatory Visit | Attending: Radiation Oncology | Admitting: Radiation Oncology

## 2018-07-15 DIAGNOSIS — Z510 Encounter for antineoplastic radiation therapy: Secondary | ICD-10-CM | POA: Diagnosis not present

## 2018-07-15 DIAGNOSIS — C61 Malignant neoplasm of prostate: Secondary | ICD-10-CM | POA: Diagnosis not present

## 2018-07-16 ENCOUNTER — Ambulatory Visit
Admission: RE | Admit: 2018-07-16 | Discharge: 2018-07-16 | Disposition: A | Discharge status: Home / Self Care | Payer: Medicare Other | Source: Ambulatory Visit | Attending: Radiation Oncology | Admitting: Radiation Oncology

## 2018-07-16 DIAGNOSIS — Z510 Encounter for antineoplastic radiation therapy: Secondary | ICD-10-CM | POA: Diagnosis not present

## 2018-07-16 DIAGNOSIS — C61 Malignant neoplasm of prostate: Secondary | ICD-10-CM | POA: Diagnosis not present

## 2018-07-17 ENCOUNTER — Ambulatory Visit
Admission: RE | Admit: 2018-07-17 | Discharge: 2018-07-17 | Disposition: A | Discharge status: Home / Self Care | Payer: Medicare Other | Source: Ambulatory Visit | Attending: Radiation Oncology | Admitting: Radiation Oncology

## 2018-07-17 DIAGNOSIS — Z510 Encounter for antineoplastic radiation therapy: Secondary | ICD-10-CM | POA: Diagnosis not present

## 2018-07-17 DIAGNOSIS — C61 Malignant neoplasm of prostate: Secondary | ICD-10-CM | POA: Diagnosis not present

## 2018-07-18 ENCOUNTER — Ambulatory Visit
Admission: RE | Admit: 2018-07-18 | Discharge: 2018-07-18 | Disposition: A | Discharge status: Home / Self Care | Payer: Medicare Other | Source: Ambulatory Visit | Attending: Radiation Oncology | Admitting: Radiation Oncology

## 2018-07-18 DIAGNOSIS — C61 Malignant neoplasm of prostate: Secondary | ICD-10-CM | POA: Diagnosis not present

## 2018-07-18 DIAGNOSIS — Z510 Encounter for antineoplastic radiation therapy: Secondary | ICD-10-CM | POA: Diagnosis not present

## 2018-07-21 ENCOUNTER — Ambulatory Visit
Admission: RE | Admit: 2018-07-21 | Discharge: 2018-07-21 | Disposition: A | Discharge status: Home / Self Care | Payer: Medicare Other | Source: Ambulatory Visit | Attending: Radiation Oncology | Admitting: Radiation Oncology

## 2018-07-21 DIAGNOSIS — Z510 Encounter for antineoplastic radiation therapy: Secondary | ICD-10-CM | POA: Diagnosis not present

## 2018-07-21 DIAGNOSIS — C61 Malignant neoplasm of prostate: Secondary | ICD-10-CM | POA: Diagnosis not present

## 2018-07-22 ENCOUNTER — Ambulatory Visit
Admission: RE | Admit: 2018-07-22 | Discharge: 2018-07-22 | Disposition: A | Discharge status: Home / Self Care | Payer: Medicare Other | Source: Ambulatory Visit | Attending: Radiation Oncology | Admitting: Radiation Oncology

## 2018-07-22 DIAGNOSIS — Z510 Encounter for antineoplastic radiation therapy: Secondary | ICD-10-CM | POA: Diagnosis not present

## 2018-07-22 DIAGNOSIS — C61 Malignant neoplasm of prostate: Secondary | ICD-10-CM | POA: Diagnosis not present

## 2018-07-23 ENCOUNTER — Ambulatory Visit
Admission: RE | Admit: 2018-07-23 | Discharge: 2018-07-23 | Disposition: A | Discharge status: Home / Self Care | Payer: Medicare Other | Source: Ambulatory Visit | Attending: Radiation Oncology | Admitting: Radiation Oncology

## 2018-07-23 DIAGNOSIS — Z510 Encounter for antineoplastic radiation therapy: Secondary | ICD-10-CM | POA: Diagnosis not present

## 2018-07-23 DIAGNOSIS — C61 Malignant neoplasm of prostate: Secondary | ICD-10-CM | POA: Diagnosis not present

## 2018-07-24 ENCOUNTER — Ambulatory Visit
Admission: RE | Admit: 2018-07-24 | Discharge: 2018-07-24 | Disposition: A | Discharge status: Home / Self Care | Payer: Medicare Other | Source: Ambulatory Visit | Attending: Radiation Oncology | Admitting: Radiation Oncology

## 2018-07-24 DIAGNOSIS — C61 Malignant neoplasm of prostate: Secondary | ICD-10-CM | POA: Diagnosis not present

## 2018-07-24 DIAGNOSIS — Z510 Encounter for antineoplastic radiation therapy: Secondary | ICD-10-CM | POA: Diagnosis not present

## 2018-07-25 ENCOUNTER — Ambulatory Visit
Admission: RE | Admit: 2018-07-25 | Discharge: 2018-07-25 | Disposition: A | Discharge status: Home / Self Care | Payer: Medicare Other | Source: Ambulatory Visit | Attending: Radiation Oncology | Admitting: Radiation Oncology

## 2018-07-25 DIAGNOSIS — C61 Malignant neoplasm of prostate: Secondary | ICD-10-CM | POA: Diagnosis not present

## 2018-07-25 DIAGNOSIS — Z510 Encounter for antineoplastic radiation therapy: Secondary | ICD-10-CM | POA: Diagnosis not present

## 2018-07-28 ENCOUNTER — Ambulatory Visit
Admission: RE | Admit: 2018-07-28 | Discharge: 2018-07-28 | Disposition: A | Discharge status: Home / Self Care | Payer: Medicare Other | Source: Ambulatory Visit | Attending: Radiation Oncology | Admitting: Radiation Oncology

## 2018-07-28 DIAGNOSIS — C61 Malignant neoplasm of prostate: Secondary | ICD-10-CM | POA: Diagnosis not present

## 2018-07-28 DIAGNOSIS — Z510 Encounter for antineoplastic radiation therapy: Secondary | ICD-10-CM | POA: Diagnosis not present

## 2018-07-29 ENCOUNTER — Ambulatory Visit
Admission: RE | Admit: 2018-07-29 | Discharge: 2018-07-29 | Disposition: A | Discharge status: Home / Self Care | Payer: Medicare Other | Source: Ambulatory Visit | Attending: Radiation Oncology | Admitting: Radiation Oncology

## 2018-07-29 DIAGNOSIS — C61 Malignant neoplasm of prostate: Secondary | ICD-10-CM | POA: Diagnosis not present

## 2018-07-29 DIAGNOSIS — D485 Neoplasm of uncertain behavior of skin: Secondary | ICD-10-CM | POA: Diagnosis not present

## 2018-07-29 DIAGNOSIS — Z510 Encounter for antineoplastic radiation therapy: Secondary | ICD-10-CM | POA: Diagnosis not present

## 2018-07-29 DIAGNOSIS — Z85828 Personal history of other malignant neoplasm of skin: Secondary | ICD-10-CM | POA: Diagnosis not present

## 2018-07-29 DIAGNOSIS — D0462 Carcinoma in situ of skin of left upper limb, including shoulder: Secondary | ICD-10-CM | POA: Diagnosis not present

## 2018-07-29 DIAGNOSIS — L570 Actinic keratosis: Secondary | ICD-10-CM | POA: Diagnosis not present

## 2018-07-30 ENCOUNTER — Ambulatory Visit
Admission: RE | Admit: 2018-07-30 | Discharge: 2018-07-30 | Disposition: A | Discharge status: Home / Self Care | Payer: Medicare Other | Source: Ambulatory Visit | Attending: Radiation Oncology | Admitting: Radiation Oncology

## 2018-07-30 DIAGNOSIS — C61 Malignant neoplasm of prostate: Secondary | ICD-10-CM | POA: Diagnosis not present

## 2018-07-30 DIAGNOSIS — Z510 Encounter for antineoplastic radiation therapy: Secondary | ICD-10-CM | POA: Diagnosis not present

## 2018-07-31 ENCOUNTER — Ambulatory Visit
Admission: RE | Admit: 2018-07-31 | Discharge: 2018-07-31 | Disposition: A | Discharge status: Home / Self Care | Payer: Medicare Other | Source: Ambulatory Visit | Attending: Radiation Oncology | Admitting: Radiation Oncology

## 2018-07-31 DIAGNOSIS — C61 Malignant neoplasm of prostate: Secondary | ICD-10-CM | POA: Insufficient documentation

## 2018-07-31 DIAGNOSIS — Z510 Encounter for antineoplastic radiation therapy: Secondary | ICD-10-CM | POA: Insufficient documentation

## 2018-08-01 ENCOUNTER — Ambulatory Visit
Admission: RE | Admit: 2018-08-01 | Discharge: 2018-08-01 | Disposition: A | Discharge status: Home / Self Care | Payer: Medicare Other | Source: Ambulatory Visit | Attending: Radiation Oncology | Admitting: Radiation Oncology

## 2018-08-01 DIAGNOSIS — Z510 Encounter for antineoplastic radiation therapy: Secondary | ICD-10-CM | POA: Diagnosis not present

## 2018-08-01 DIAGNOSIS — C61 Malignant neoplasm of prostate: Secondary | ICD-10-CM | POA: Diagnosis not present

## 2018-08-04 ENCOUNTER — Ambulatory Visit
Admission: RE | Admit: 2018-08-04 | Discharge: 2018-08-04 | Disposition: A | Discharge status: Home / Self Care | Payer: Medicare Other | Source: Ambulatory Visit | Attending: Radiation Oncology | Admitting: Radiation Oncology

## 2018-08-04 DIAGNOSIS — C61 Malignant neoplasm of prostate: Secondary | ICD-10-CM | POA: Diagnosis not present

## 2018-08-04 DIAGNOSIS — Z510 Encounter for antineoplastic radiation therapy: Secondary | ICD-10-CM | POA: Diagnosis not present

## 2018-08-05 ENCOUNTER — Ambulatory Visit: Payer: Medicare Other | Admitting: *Deleted

## 2018-08-05 ENCOUNTER — Ambulatory Visit
Admission: RE | Admit: 2018-08-05 | Discharge: 2018-08-05 | Disposition: A | Discharge status: Home / Self Care | Payer: Medicare Other | Source: Ambulatory Visit | Attending: Radiation Oncology | Admitting: Radiation Oncology

## 2018-08-05 DIAGNOSIS — Z510 Encounter for antineoplastic radiation therapy: Secondary | ICD-10-CM | POA: Diagnosis not present

## 2018-08-05 DIAGNOSIS — I4821 Permanent atrial fibrillation: Secondary | ICD-10-CM

## 2018-08-05 DIAGNOSIS — Z7901 Long term (current) use of anticoagulants: Secondary | ICD-10-CM

## 2018-08-05 DIAGNOSIS — C61 Malignant neoplasm of prostate: Secondary | ICD-10-CM | POA: Diagnosis not present

## 2018-08-05 LAB — POCT INR: INR: 2.5 (ref 2.0–3.0)

## 2018-08-05 NOTE — Patient Instructions (Signed)
Description   Continue taking your same dosage 1 tablet daily except 1.5 tablets on Sundays and Wednesdays. Recheck in 4 weeks. Call us with any update or changes #[phone].      

## 2018-08-06 ENCOUNTER — Ambulatory Visit
Admission: RE | Admit: 2018-08-06 | Discharge: 2018-08-06 | Disposition: A | Discharge status: Home / Self Care | Payer: Medicare Other | Source: Ambulatory Visit | Attending: Radiation Oncology | Admitting: Radiation Oncology

## 2018-08-06 ENCOUNTER — Other Ambulatory Visit: Payer: Self-pay | Admitting: Cardiovascular Disease

## 2018-08-06 DIAGNOSIS — C61 Malignant neoplasm of prostate: Secondary | ICD-10-CM | POA: Diagnosis not present

## 2018-08-06 DIAGNOSIS — Z510 Encounter for antineoplastic radiation therapy: Secondary | ICD-10-CM | POA: Diagnosis not present

## 2018-08-07 ENCOUNTER — Ambulatory Visit
Admission: RE | Admit: 2018-08-07 | Discharge: 2018-08-07 | Disposition: A | Discharge status: Home / Self Care | Payer: Medicare Other | Source: Ambulatory Visit | Attending: Radiation Oncology | Admitting: Radiation Oncology

## 2018-08-07 DIAGNOSIS — Z510 Encounter for antineoplastic radiation therapy: Secondary | ICD-10-CM | POA: Diagnosis not present

## 2018-08-07 DIAGNOSIS — C61 Malignant neoplasm of prostate: Secondary | ICD-10-CM | POA: Diagnosis not present

## 2018-08-08 ENCOUNTER — Ambulatory Visit
Admission: RE | Admit: 2018-08-08 | Discharge: 2018-08-08 | Disposition: A | Discharge status: Home / Self Care | Payer: Medicare Other | Source: Ambulatory Visit | Attending: Radiation Oncology | Admitting: Radiation Oncology

## 2018-08-08 DIAGNOSIS — Z510 Encounter for antineoplastic radiation therapy: Secondary | ICD-10-CM | POA: Diagnosis not present

## 2018-08-08 DIAGNOSIS — C61 Malignant neoplasm of prostate: Secondary | ICD-10-CM | POA: Diagnosis not present

## 2018-08-11 ENCOUNTER — Ambulatory Visit
Admission: RE | Admit: 2018-08-11 | Discharge: 2018-08-11 | Disposition: A | Discharge status: Home / Self Care | Payer: Medicare Other | Source: Ambulatory Visit | Attending: Radiation Oncology | Admitting: Radiation Oncology

## 2018-08-11 DIAGNOSIS — C61 Malignant neoplasm of prostate: Secondary | ICD-10-CM | POA: Diagnosis not present

## 2018-08-11 DIAGNOSIS — Z510 Encounter for antineoplastic radiation therapy: Secondary | ICD-10-CM | POA: Diagnosis not present

## 2018-08-12 ENCOUNTER — Ambulatory Visit
Admission: RE | Admit: 2018-08-12 | Discharge: 2018-08-12 | Disposition: A | Discharge status: Home / Self Care | Payer: Medicare Other | Source: Ambulatory Visit | Attending: Radiation Oncology | Admitting: Radiation Oncology

## 2018-08-12 DIAGNOSIS — C61 Malignant neoplasm of prostate: Secondary | ICD-10-CM | POA: Diagnosis not present

## 2018-08-12 DIAGNOSIS — Z510 Encounter for antineoplastic radiation therapy: Secondary | ICD-10-CM | POA: Diagnosis not present

## 2018-08-13 ENCOUNTER — Ambulatory Visit
Admission: RE | Admit: 2018-08-13 | Discharge: 2018-08-13 | Disposition: A | Discharge status: Home / Self Care | Payer: Medicare Other | Source: Ambulatory Visit | Attending: Radiation Oncology | Admitting: Radiation Oncology

## 2018-08-13 DIAGNOSIS — C61 Malignant neoplasm of prostate: Secondary | ICD-10-CM | POA: Diagnosis not present

## 2018-08-13 DIAGNOSIS — Z510 Encounter for antineoplastic radiation therapy: Secondary | ICD-10-CM | POA: Diagnosis not present

## 2018-08-14 ENCOUNTER — Ambulatory Visit
Admission: RE | Admit: 2018-08-14 | Discharge: 2018-08-14 | Disposition: A | Discharge status: Home / Self Care | Payer: Medicare Other | Source: Ambulatory Visit | Attending: Radiation Oncology | Admitting: Radiation Oncology

## 2018-08-14 DIAGNOSIS — C61 Malignant neoplasm of prostate: Secondary | ICD-10-CM | POA: Diagnosis not present

## 2018-08-14 DIAGNOSIS — Z510 Encounter for antineoplastic radiation therapy: Secondary | ICD-10-CM | POA: Diagnosis not present

## 2018-08-15 ENCOUNTER — Ambulatory Visit
Admission: RE | Admit: 2018-08-15 | Discharge: 2018-08-15 | Disposition: A | Discharge status: Home / Self Care | Payer: Medicare Other | Source: Ambulatory Visit | Attending: Radiation Oncology | Admitting: Radiation Oncology

## 2018-08-15 DIAGNOSIS — C61 Malignant neoplasm of prostate: Secondary | ICD-10-CM | POA: Diagnosis not present

## 2018-08-15 DIAGNOSIS — Z510 Encounter for antineoplastic radiation therapy: Secondary | ICD-10-CM | POA: Diagnosis not present

## 2018-08-17 ENCOUNTER — Ambulatory Visit: Admission: RE | Admit: 2018-08-17 | Payer: Medicare Other | Source: Ambulatory Visit

## 2018-08-18 ENCOUNTER — Ambulatory Visit
Admission: RE | Admit: 2018-08-18 | Discharge: 2018-08-18 | Disposition: A | Discharge status: Home / Self Care | Payer: Medicare Other | Source: Ambulatory Visit | Attending: Radiation Oncology | Admitting: Radiation Oncology

## 2018-08-18 DIAGNOSIS — Z510 Encounter for antineoplastic radiation therapy: Secondary | ICD-10-CM | POA: Diagnosis not present

## 2018-08-18 DIAGNOSIS — C61 Malignant neoplasm of prostate: Secondary | ICD-10-CM | POA: Diagnosis not present

## 2018-08-19 ENCOUNTER — Ambulatory Visit
Admission: RE | Admit: 2018-08-19 | Discharge: 2018-08-19 | Disposition: A | Discharge status: Home / Self Care | Payer: Medicare Other | Source: Ambulatory Visit | Attending: Radiation Oncology | Admitting: Radiation Oncology

## 2018-08-19 DIAGNOSIS — C61 Malignant neoplasm of prostate: Secondary | ICD-10-CM | POA: Diagnosis not present

## 2018-08-19 DIAGNOSIS — Z510 Encounter for antineoplastic radiation therapy: Secondary | ICD-10-CM | POA: Diagnosis not present

## 2018-08-20 ENCOUNTER — Ambulatory Visit
Admission: RE | Admit: 2018-08-20 | Discharge: 2018-08-20 | Disposition: A | Discharge status: Home / Self Care | Payer: Medicare Other | Source: Ambulatory Visit | Attending: Radiation Oncology | Admitting: Radiation Oncology

## 2018-08-20 DIAGNOSIS — C61 Malignant neoplasm of prostate: Secondary | ICD-10-CM | POA: Diagnosis not present

## 2018-08-20 DIAGNOSIS — Z510 Encounter for antineoplastic radiation therapy: Secondary | ICD-10-CM | POA: Diagnosis not present

## 2018-08-21 ENCOUNTER — Encounter: Payer: Self-pay | Admitting: Physician Assistant

## 2018-08-21 ENCOUNTER — Ambulatory Visit
Admission: RE | Admit: 2018-08-21 | Discharge: 2018-08-21 | Disposition: A | Discharge status: Home / Self Care | Payer: Medicare Other | Source: Ambulatory Visit | Attending: Radiation Oncology | Admitting: Radiation Oncology

## 2018-08-21 ENCOUNTER — Ambulatory Visit (INDEPENDENT_AMBULATORY_CARE_PROVIDER_SITE_OTHER): Payer: Medicare Other | Admitting: Physician Assistant

## 2018-08-21 VITALS — BP 124/72 | HR 88 | Ht 71.0 in | Wt 185.8 lb

## 2018-08-21 DIAGNOSIS — I10 Essential (primary) hypertension: Secondary | ICD-10-CM

## 2018-08-21 DIAGNOSIS — C61 Malignant neoplasm of prostate: Secondary | ICD-10-CM | POA: Diagnosis not present

## 2018-08-21 DIAGNOSIS — I340 Nonrheumatic mitral (valve) insufficiency: Secondary | ICD-10-CM

## 2018-08-21 DIAGNOSIS — I4821 Permanent atrial fibrillation: Secondary | ICD-10-CM

## 2018-08-21 DIAGNOSIS — Z510 Encounter for antineoplastic radiation therapy: Secondary | ICD-10-CM | POA: Diagnosis not present

## 2018-08-21 DIAGNOSIS — Z7901 Long term (current) use of anticoagulants: Secondary | ICD-10-CM | POA: Diagnosis not present

## 2018-08-21 DIAGNOSIS — I482 Chronic atrial fibrillation: Secondary | ICD-10-CM | POA: Diagnosis not present

## 2018-08-21 MED ORDER — OLMESARTAN MEDOXOMIL-HCTZ 20-12.5 MG PO TABS: 1.0000 | ORAL_TABLET | Freq: Every day | ORAL | 3 refills | Status: DC

## 2018-08-21 NOTE — Progress Notes (Signed)
Cardiology Office Note    Date:  08/21/2018   ID:  Daniel Love, DOB 1940-09-23, MRN [id number]  PCP:  Chesley Noon, MD  Cardiologist:  Dr. Acie Fredrickson  Chief Complaint: 4 Months follow up  History of Present Illness:   Daniel Love is a 78 y.o. male with permanent atrial fibrillation, status post prior cardioversion in 2011, hypertension, chronic anticoagulation with warfarin presents for follow up.   Stress test 02/17/18 demonstrated  1. EF 42%, diffuse hypokinesis.  2. Fixed small, mild apical lateral perfusion defect. Possible prior infarction, no evidence for significant ischemia. Intermediate risk  Follow up Echo 02/24/18 showed LVEF 60-65, normal wall motion, trivial AI, moderate to severe MR, mild LAE, mild RVE, mild RAE, mild to moderate TR, PASP 35.   Here today for follow up. Walks about 1 miles/day.  He denies chest pain, shortness of breath, palpitation, orthopnea, PND, syncope or melena.  He has stopped taking his Benicar/hydrochlorothiazide for the past 2 weeks as he do not like to the bathroom often.  Since then he has noted lower extremity edema.  Past Medical History:  Diagnosis Date  . Anal fissure    hx of   . Atrial fibrillation (HCC)    maintaining normal sinus rhythm  . Chronic anticoagulation    coumadin  . Dyspepsia   . GERD (gastroesophageal reflux disease)   . Glaucoma   . History of colon polyps 2012   Colonoscopy-Dr. Benson [place]   . History of echocardiogram    Echocardiogram 2/19: EF 60-65, normal wall motion, trivial AI, moderate to severe MR, mild LAE, mild RAE, mild to moderate TR, PASP 35  . History of kidney stones   . History of nuclear stress test    Myoview 2/19: EF 42, diffuse HK no significant ischemia, apical lateral scar  . Hypertension   . Prostate cancer ([place])   . Skin cancer    skin cancer removed from left arm and back    Past Surgical History:  Procedure Laterality Date  . BACK SURGERY  12/31/1977   for degenerative  disc disease  . COLONOSCOPY    . PROSTATE BIOPSY    . UPPER GASTROINTESTINAL ENDOSCOPY      Current Medications: Prior to Admission medications   Medication Sig Start Date End Date Taking? Authorizing Provider  amLODipine (NORVASC) 5 MG tablet TAKE ONE TABLET BY MOUTH DAILY 04/08/18   Richardson Dopp T, PA-C  amLODIPine Besylate (NORVASC PO) Take by mouth.    [provider]  colchicine 0.6 MG tablet as needed. 06/18/16   [provider]  LUMIGAN 0.01 % SOLN Place 1 drop into both eyes daily. 06/13/16   [provider]  Methylcellulose, Laxative, (CITRUCEL PO) Take by mouth as needed (for constipation).     [provider]  Multiple Vitamins-Minerals (CENTRUM SILVER PO) Take 1 tablet by mouth every morning.      [provider]  nystatin cream (MYCOSTATIN) Apply 1 application topically as needed (RASH).  11/20/11   [provider]  olmesartan-hydrochlorothiazide (BENICAR HCT) 40-25 MG tablet TAKE ONE TABLET BY MOUTH DAILY 10/04/17   Nahser, Wonda Cheng, MD  omeprazole (PRILOSEC) 40 MG capsule Take 40 mg by mouth daily as needed.     [provider]  warfarin (COUMADIN) 5 MG tablet TAKE AS DIRECTED BY COUMADIN CLINIC 08/06/18   Nahser, Wonda Cheng, MD    Allergies:   Amlodipine and Lisinopril   Social History   Socioeconomic History  . Marital status:  Married    Spouse name: Not on file  . Number of children: 3  . Years of education: Not on file  . Highest education level: Not on file  Occupational History  . Occupation: Retired  Scientific laboratory technician  . Financial resource strain: Not on file  . Food insecurity:    Worry: Not on file    Inability: Not on file  . Transportation needs:    Medical: Not on file    Non-medical: Not on file  Tobacco Use  . Smoking status: Former Smoker    Packs/day: 1.00    Years: 7.00    Pack years: 7.00    Types: Cigarettes    Last attempt to quit: 12/31/1966    Years since quitting: 51.6  . Smokeless  tobacco: Never Used  Substance and Sexual Activity  . Alcohol use: Yes    Alcohol/week: 7.0 standard drinks    Types: 7 Glasses of wine per week    Comment: 1 daily   . Drug use: No  . Sexual activity: Not Currently  Lifestyle  . Physical activity:    Days per week: Not on file    Minutes per session: Not on file  . Stress: Not on file  Relationships  . Social connections:    Talks on phone: Not on file    Gets together: Not on file    Attends religious service: Not on file    Active member of club or organization: Not on file    Attends meetings of clubs or organizations: Not on file    Relationship status: Not on file  Other Topics Concern  . 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 They share three daughters, two live local, one in [place].     Family History:  The patient's family history includes Cancer in his father and mother; Colon cancer in his maternal grandmother; Heart attack in his father; Heart failure in his father.   ROS:   Please see the history of present illness.    ROS All other systems reviewed and are negative.   PHYSICAL EXAM:   VS:  BP 124/72   Pulse 88   Ht 5\' 11"  (1.803 m)   Wt 185 lb 12.8 oz (84.3 kg)   BMI 25.91 kg/m    GEN: Well nourished, well developed, in no acute distress  HEENT: normal  Neck: no JVD, carotid bruits, or masses Cardiac: Irregularly irregular; no murmurs, rubs, or gallops, trace ankle edema edema  Respiratory:  clear to auscultation bilaterally, normal work of breathing GI: soft, nontender, nondistended, + BS MS: no deformity or atrophy  Skin: warm and dry, no rash Neuro:  Alert and Oriented x 3, Strength and sensation are intact Psych: euthymic mood, full affect  Wt Readings from Last 3 Encounters:  08/21/18 185 lb 12.8 oz (84.3 kg)  04/09/18 179 lb 3.2 oz (81.3 kg)  02/26/18 181 lb 6.4 oz (82.3 kg)      Studies/Labs Reviewed:   EKG:  EKG is  not ordered today.   Recent Labs: 08/30/2017: Hemoglobin 14.8; Platelets 168 02/12/2018: BUN 26; Creatinine, Ser 1.33; Potassium 4.1; Sodium 142; TSH 2.970   Lipid Panel No results found for: CHOL, TRIG, HDL, CHOLHDL, VLDL, LDLCALC, LDLDIRECT  Additional studies/ records that were reviewed today include:   As summarized above    ASSESSMENT & PLAN:    1. Permanent atrial fibrillation Rate controlled.  No bleeding issue.  Continue Coumadin.  2. Moderate to severe Mitral Regurgitation by echo 01/2017 -Denies dyspnea or orthopnea.  Mild lower extremity edema.  He will restart his Benicar/HCTZ at half a dose at 20/12.5 mg daily. -Repeat echocardiogram during next office visit.  3. HTN -Blood pressure stable and controlled.    Medication Adjustments/Labs and Tests Ordered: Current medicines are reviewed at length with the patient today.  Concerns regarding medicines are outlined above.  Medication changes, Labs and Tests ordered today are listed in the Patient Instructions below. Patient Instructions  Medication Instructions:  Your physician has recommended you make the following change in your medication:  1.  RESTART the Benicar at 1/2 of a pill daily   Labwork: None ordered  Testing/Procedures: None ordered  Follow-Up: Your physician wants you to follow-up in: [place], PA-C You will receive a reminder letter in the mail two months in advance. If you don't receive a letter, please call our office to schedule the follow-up appointment.  Any Other Special Instructions Will Be Listed Below (If Applicable).     If you need a refill on your cardiac medications before your next appointment, please call your pharmacy.      Jarrett Soho, [place]  08/21/2018 3:45 PM    [facility] [address], [place], [place]  [address] Phone: [phone]; Fax: [phone]

## 2018-08-21 NOTE — Patient Instructions (Addendum)
Medication Instructions:  Your physician has recommended you make the following change in your medication:  1.  RESTART the Benicar at 1/2 of a pill daily   Labwork: None ordered  Testing/Procedures: None ordered  Follow-Up: Your physician wants you to follow-up in: [place], PA-C You will receive a reminder letter in the mail two months in advance. If you don't receive a letter, please call our office to schedule the follow-up appointment.  Any Other Special Instructions Will Be Listed Below (If Applicable).     If you need a refill on your cardiac medications before your next appointment, please call your pharmacy.

## 2018-08-22 ENCOUNTER — Ambulatory Visit
Admission: RE | Admit: 2018-08-22 | Discharge: 2018-08-22 | Disposition: A | Discharge status: Home / Self Care | Payer: Medicare Other | Source: Ambulatory Visit | Attending: Radiation Oncology | Admitting: Radiation Oncology

## 2018-08-22 DIAGNOSIS — Z510 Encounter for antineoplastic radiation therapy: Secondary | ICD-10-CM | POA: Diagnosis not present

## 2018-08-22 DIAGNOSIS — C61 Malignant neoplasm of prostate: Secondary | ICD-10-CM | POA: Diagnosis not present

## 2018-08-25 ENCOUNTER — Ambulatory Visit
Admission: RE | Admit: 2018-08-25 | Discharge: 2018-08-25 | Disposition: A | Discharge status: Home / Self Care | Payer: Medicare Other | Source: Ambulatory Visit | Attending: Radiation Oncology | Admitting: Radiation Oncology

## 2018-08-25 DIAGNOSIS — C61 Malignant neoplasm of prostate: Secondary | ICD-10-CM | POA: Diagnosis not present

## 2018-08-25 DIAGNOSIS — Z510 Encounter for antineoplastic radiation therapy: Secondary | ICD-10-CM | POA: Diagnosis not present

## 2018-08-26 ENCOUNTER — Ambulatory Visit
Admission: RE | Admit: 2018-08-26 | Discharge: 2018-08-26 | Disposition: A | Discharge status: Home / Self Care | Payer: Medicare Other | Source: Ambulatory Visit | Attending: Radiation Oncology | Admitting: Radiation Oncology

## 2018-08-26 DIAGNOSIS — C61 Malignant neoplasm of prostate: Secondary | ICD-10-CM | POA: Diagnosis not present

## 2018-08-26 DIAGNOSIS — Z510 Encounter for antineoplastic radiation therapy: Secondary | ICD-10-CM | POA: Diagnosis not present

## 2018-08-27 ENCOUNTER — Ambulatory Visit
Admission: RE | Admit: 2018-08-27 | Discharge: 2018-08-27 | Disposition: A | Discharge status: Home / Self Care | Payer: Medicare Other | Source: Ambulatory Visit | Attending: Radiation Oncology | Admitting: Radiation Oncology

## 2018-08-27 ENCOUNTER — Other Ambulatory Visit: Payer: Self-pay | Admitting: Radiation Oncology

## 2018-08-27 DIAGNOSIS — C61 Malignant neoplasm of prostate: Secondary | ICD-10-CM | POA: Diagnosis not present

## 2018-08-27 DIAGNOSIS — Z510 Encounter for antineoplastic radiation therapy: Secondary | ICD-10-CM | POA: Diagnosis not present

## 2018-08-27 MED ORDER — TAMSULOSIN HCL 0.4 MG PO CAPS: 0.4000 mg | ORAL_CAPSULE | Freq: Every day | ORAL | 5 refills | Status: DC

## 2018-08-28 ENCOUNTER — Ambulatory Visit
Admission: RE | Admit: 2018-08-28 | Discharge: 2018-08-28 | Disposition: A | Discharge status: Home / Self Care | Payer: Medicare Other | Source: Ambulatory Visit | Attending: Radiation Oncology | Admitting: Radiation Oncology

## 2018-08-28 DIAGNOSIS — Z510 Encounter for antineoplastic radiation therapy: Secondary | ICD-10-CM | POA: Diagnosis not present

## 2018-08-28 DIAGNOSIS — C61 Malignant neoplasm of prostate: Secondary | ICD-10-CM | POA: Diagnosis not present

## 2018-08-29 ENCOUNTER — Ambulatory Visit
Admission: RE | Admit: 2018-08-29 | Discharge: 2018-08-29 | Disposition: A | Discharge status: Home / Self Care | Payer: Medicare Other | Source: Ambulatory Visit | Attending: Radiation Oncology | Admitting: Radiation Oncology

## 2018-08-29 DIAGNOSIS — Z510 Encounter for antineoplastic radiation therapy: Secondary | ICD-10-CM | POA: Diagnosis not present

## 2018-08-29 DIAGNOSIS — C61 Malignant neoplasm of prostate: Secondary | ICD-10-CM | POA: Diagnosis not present

## 2018-09-02 ENCOUNTER — Ambulatory Visit (INDEPENDENT_AMBULATORY_CARE_PROVIDER_SITE_OTHER): Payer: Medicare Other | Admitting: *Deleted

## 2018-09-02 ENCOUNTER — Encounter: Payer: Self-pay | Admitting: Radiation Oncology

## 2018-09-02 ENCOUNTER — Ambulatory Visit
Admission: RE | Admit: 2018-09-02 | Discharge: 2018-09-02 | Disposition: A | Discharge status: Home / Self Care | Payer: Medicare Other | Source: Ambulatory Visit | Attending: Radiation Oncology | Admitting: Radiation Oncology

## 2018-09-02 ENCOUNTER — Encounter: Payer: Self-pay | Admitting: Medical Oncology

## 2018-09-02 DIAGNOSIS — Z7901 Long term (current) use of anticoagulants: Secondary | ICD-10-CM | POA: Diagnosis not present

## 2018-09-02 DIAGNOSIS — Z510 Encounter for antineoplastic radiation therapy: Secondary | ICD-10-CM | POA: Insufficient documentation

## 2018-09-02 DIAGNOSIS — C61 Malignant neoplasm of prostate: Secondary | ICD-10-CM | POA: Diagnosis not present

## 2018-09-02 DIAGNOSIS — I482 Chronic atrial fibrillation: Secondary | ICD-10-CM | POA: Diagnosis not present

## 2018-09-02 DIAGNOSIS — I4821 Permanent atrial fibrillation: Secondary | ICD-10-CM

## 2018-09-02 LAB — POCT INR: INR: 2 (ref 2.0–3.0)

## 2018-09-02 NOTE — Patient Instructions (Signed)
Description   Continue taking your same dosage 1 tablet daily except 1.5 tablets on Sundays and Wednesdays. Recheck in 4 weeks. Call us with any update or changes #[phone].      

## 2018-09-04 NOTE — Progress Notes (Signed)
  Radiation Oncology         [phone]) [phone] ________________________________  Name: Daniel Love MRN: [id number]  Date: 09/02/2018  DOB: 09-Nov-1940  End of Treatment Note  Diagnosis:   78 y.o. male with Stage T1c adenocarcinoma of the prostate with Gleason Score of 4+5, and PSA of 16.20     Indication for treatment:  Curative, Definitive Radiotherapy       Radiation treatment dates:   07/08/2018 - 09/02/2018  Site/dose:  1. The prostate, seminal vesicles, and pelvic lymph nodes were initially treated to 45 Gy in 25 fractions of 1.8 Gy. 2. The prostate only was boosted to 75 Gy with 15 additional fractions of 2.0 Gy.  Beams/energy:  1. The prostate, seminal vesicles, and pelvic lymph nodes were initially treated using VMAT intensity modulated radiotherapy delivering 6 megavolt photons. Image guidance was performed with CB-CT studies prior to each fraction. He was immobilized with a body fix lower extremity mold.  2. The prostate only was boosted using VMAT intensity modulated radiotherapy delivering 6 megavolt photons. Image guidance was performed with CB-CT studies prior to each fraction. He was immobilized with a body fix lower extremity mold.  Narrative: The patient tolerated radiation treatment relatively well.  He experienced modest fatigue and some minor urinary irritation with urgency, occasional scant leakage, incomplete emptying, and nocturia x5-6. He also reported more frequent, softer bowel movements.  Plan: The patient has completed radiation treatment. He will return to radiation oncology clinic for routine followup in one month. I advised him to call or return sooner if he has any questions or concerns related to his recovery or treatment. ________________________________  Sheral Apley. Tammi Klippel, M.D.  This document serves as a record of services personally performed by Tyler Pita, MD. It was created on his behalf by Rae Lips, a trained medical scribe. The creation of  this record is based on the scribe's personal observations and the provider's statements to them. This document has been checked and approved by the attending provider.

## 2018-09-20 DIAGNOSIS — Z23 Encounter for immunization: Secondary | ICD-10-CM | POA: Diagnosis not present

## 2018-09-26 ENCOUNTER — Encounter: Payer: Self-pay | Admitting: Urology

## 2018-09-26 ENCOUNTER — Ambulatory Visit
Admission: RE | Admit: 2018-09-26 | Discharge: 2018-09-26 | Disposition: A | Discharge status: Home / Self Care | Payer: Medicare Other | Source: Ambulatory Visit | Attending: Urology | Admitting: Urology

## 2018-09-26 ENCOUNTER — Other Ambulatory Visit: Payer: Self-pay

## 2018-09-26 VITALS — BP 131/79 | HR 70 | Temp 97.8°F | Resp 18 | Ht 71.0 in | Wt 187.2 lb

## 2018-09-26 DIAGNOSIS — C61 Malignant neoplasm of prostate: Secondary | ICD-10-CM | POA: Diagnosis not present

## 2018-09-26 DIAGNOSIS — Z7901 Long term (current) use of anticoagulants: Secondary | ICD-10-CM | POA: Diagnosis not present

## 2018-09-26 DIAGNOSIS — Z79899 Other long term (current) drug therapy: Secondary | ICD-10-CM | POA: Insufficient documentation

## 2018-09-26 DIAGNOSIS — Z923 Personal history of irradiation: Secondary | ICD-10-CM | POA: Diagnosis not present

## 2018-09-26 DIAGNOSIS — Z888 Allergy status to other drugs, medicaments and biological substances status: Secondary | ICD-10-CM | POA: Insufficient documentation

## 2018-09-26 NOTE — Progress Notes (Signed)
Radiation Oncology         [phone] ________________________________  Name: Deshon Hsiao MRN: [id number]  Date: 09/26/2018  DOB: 1940-05-19  Post Treatment Note  CC: Chesley Noon, MD  Ardis Hughs, MD  Diagnosis:   78 y.o. male with Stage T1c adenocarcinoma of the prostate with Gleason Score of 4+5, and PSA of 16.20     Interval Since Last Radiation:  3.5 weeks s/p completion of Curative, Definitive Radiotherapy in combination with LT-ADT 07/08/2018 - 09/02/2018: 1. The prostate, seminal vesicles, and pelvic lymph nodes were initially treated to 45 Gy in 25 fractions of 1.8 Gy. 2. The prostate only was boosted to 75 Gy with 15 additional fractions of 2.0 Gy.  Narrative:  The patient returns today for routine follow-up. He tolerated radiation treatment relatively well.  He experienced modest fatigue and some minor urinary irritation with urgency, occasional scant leakage, incomplete emptying, and nocturia x5-6. He also reported more frequent, softer bowel movements.  He continued to tolerate ADT well throughout his course of treatment.                            On review of systems, the patient states that he is doing very well overall.  He reports almost complete resolution of his lower urinary tract symptoms within the past week.  He continues with nocturia 2-3 times per night but denies excessive daytime frequency, urgency, weak stream, intermittency, hesitancy or incomplete bladder emptying.  He denies incontinence, dysuria or gross hematuria.  He reports a healthy appetite and is maintaining his weight.  He does continue with modest fatigue and hot flashes associated with the androgen deprivation therapy but overall feels that he tolerates this well.  He denies abdominal pain, nausea, vomiting, diarrhea or constipation.  He does not currently have a follow-up visit scheduled with Dr. [name] that he is aware of but reports that he will be due for a repeat Lupron  injection around December.  ALLERGIES:  is allergic to amlodipine and lisinopril.  Meds: Current Outpatient Medications  Medication Sig Dispense Refill  . amLODipine (NORVASC) 5 MG tablet TAKE ONE TABLET BY MOUTH DAILY 90 tablet 3  . colchicine 0.6 MG tablet Take 0.6 mg by mouth daily as needed (gout).     [name] LUMIGAN 0.01 % SOLN Place 1 drop into both eyes daily.    . Multiple Vitamins-Minerals (CENTRUM SILVER PO) Take 1 tablet by mouth every morning.      [name] omeprazole (PRILOSEC) 40 MG capsule Take 40 mg by mouth daily as needed.     . warfarin (COUMADIN) 5 MG tablet TAKE AS DIRECTED BY COUMADIN CLINIC 40 tablet 3  . Methylcellulose, Laxative, (CITRUCEL PO) Take by mouth as needed (for constipation).     . nystatin cream (MYCOSTATIN) Apply 1 application topically as needed (RASH).     [name] olmesartan-hydrochlorothiazide (BENICAR HCT) 20-12.5 MG tablet Take 1 tablet by mouth daily. (Patient not taking: Reported on 09/26/2018) 90 tablet 3   Current Facility-Administered Medications  Medication Dose Route Frequency Provider Last Rate Last Dose  . 0.9 %  sodium chloride infusion  500 mL Intravenous Continuous Milus Banister, MD        Physical Findings:  height is 5\' 11"  (1.803 m) and weight is 187 lb 3.2 oz (84.9 kg). His oral temperature is 97.8 F (36.6 C). His blood pressure is 131/79 and his pulse is 70. His respiration is 18  and oxygen saturation is 98%.  Pain Assessment Pain Score: 0-No pain/10 In general this is a well appearing Caucasian male in no acute distress.  He's alert and oriented x4 and appropriate throughout the examination. Cardiopulmonary assessment is negative for acute distress and he exhibits normal effort.   Lab Findings: Lab Results  Component Value Date   WBC 6.6 08/30/2017   HGB 14.8 08/30/2017   HCT 45.2 08/30/2017   MCV 92 08/30/2017   PLT 168 08/30/2017     Radiographic Findings: No results found.  Impression/Plan: 1. 78 y.o. male with Stage T1c  adenocarcinoma of the prostate with Gleason Score of 4+5, and PSA of 16.20.   He will continue to follow up with urology for ongoing PSA determinations.  He does not currently have a scheduled appointment with Dr. [name] for follow up but reports that he will be due for a repeat 6 month Lupron injection in 11/2018. He anticipates completing a 2 year course of ADT which he continues to tolerate relatively well.  He understands what to expect with regards to PSA monitoring going forward. I will look forward to following his response to treatment via correspondence with urology, and would be happy to continue to participate in his care if clinically indicated. I talked to the patient about what to expect in the future, including his risk for erectile dysfunction and rectal bleeding. I encouraged him to call or return to the office if he has any questions regarding his previous radiation or possible radiation side effects. He was comfortable with this plan and will follow up as needed.    Nicholos Johns, PA-C

## 2018-09-30 ENCOUNTER — Ambulatory Visit (INDEPENDENT_AMBULATORY_CARE_PROVIDER_SITE_OTHER): Payer: Medicare Other

## 2018-09-30 DIAGNOSIS — Z7901 Long term (current) use of anticoagulants: Secondary | ICD-10-CM | POA: Diagnosis not present

## 2018-09-30 DIAGNOSIS — I4821 Permanent atrial fibrillation: Secondary | ICD-10-CM

## 2018-09-30 LAB — POCT INR: INR: 2 (ref 2.0–3.0)

## 2018-09-30 NOTE — Patient Instructions (Signed)
Continue taking your same dosage 1 tablet daily except 1.5 tablets on Sundays and Wednesdays. Recheck in 5 weeks. Call us with any update or changes [phone].

## 2018-10-07 DIAGNOSIS — H4010X1 Unspecified open-angle glaucoma, mild stage: Secondary | ICD-10-CM | POA: Diagnosis not present

## 2018-10-09 DIAGNOSIS — C61 Malignant neoplasm of prostate: Secondary | ICD-10-CM | POA: Diagnosis not present

## 2018-10-13 DIAGNOSIS — R609 Edema, unspecified: Secondary | ICD-10-CM | POA: Diagnosis not present

## 2018-10-13 DIAGNOSIS — I4821 Permanent atrial fibrillation: Secondary | ICD-10-CM | POA: Diagnosis not present

## 2018-10-13 DIAGNOSIS — C61 Malignant neoplasm of prostate: Secondary | ICD-10-CM | POA: Diagnosis not present

## 2018-10-13 DIAGNOSIS — R030 Elevated blood-pressure reading, without diagnosis of hypertension: Secondary | ICD-10-CM | POA: Diagnosis not present

## 2018-10-14 DIAGNOSIS — R3915 Urgency of urination: Secondary | ICD-10-CM | POA: Diagnosis not present

## 2018-10-14 DIAGNOSIS — Z8546 Personal history of malignant neoplasm of prostate: Secondary | ICD-10-CM | POA: Diagnosis not present

## 2018-10-27 DIAGNOSIS — C7951 Secondary malignant neoplasm of bone: Secondary | ICD-10-CM | POA: Diagnosis not present

## 2018-10-27 DIAGNOSIS — Z5111 Encounter for antineoplastic chemotherapy: Secondary | ICD-10-CM | POA: Diagnosis not present

## 2018-10-27 DIAGNOSIS — C61 Malignant neoplasm of prostate: Secondary | ICD-10-CM | POA: Diagnosis not present

## 2018-11-04 ENCOUNTER — Ambulatory Visit (INDEPENDENT_AMBULATORY_CARE_PROVIDER_SITE_OTHER): Payer: Medicare Other | Admitting: *Deleted

## 2018-11-04 DIAGNOSIS — I4821 Permanent atrial fibrillation: Secondary | ICD-10-CM | POA: Diagnosis not present

## 2018-11-04 DIAGNOSIS — R195 Other fecal abnormalities: Secondary | ICD-10-CM | POA: Diagnosis not present

## 2018-11-04 DIAGNOSIS — I10 Essential (primary) hypertension: Secondary | ICD-10-CM | POA: Diagnosis not present

## 2018-11-04 DIAGNOSIS — R609 Edema, unspecified: Secondary | ICD-10-CM | POA: Diagnosis not present

## 2018-11-04 DIAGNOSIS — Z7901 Long term (current) use of anticoagulants: Secondary | ICD-10-CM

## 2018-11-04 DIAGNOSIS — R350 Frequency of micturition: Secondary | ICD-10-CM | POA: Diagnosis not present

## 2018-11-04 LAB — POCT INR: INR: 1.8 — AB (ref 2.0–3.0)

## 2018-11-04 NOTE — Patient Instructions (Signed)
Description   Today take an extra 1/2 tablet, tomorrow take 2 tablets, then Continue taking your same dosage 1 tablet daily except 1.5 tablets on Sundays and Wednesdays. Recheck in 2 weeks. Call us with any update or changes [phone].

## 2018-11-11 DIAGNOSIS — H4010X1 Unspecified open-angle glaucoma, mild stage: Secondary | ICD-10-CM | POA: Diagnosis not present

## 2018-11-17 DIAGNOSIS — B9689 Other specified bacterial agents as the cause of diseases classified elsewhere: Secondary | ICD-10-CM | POA: Diagnosis not present

## 2018-11-17 DIAGNOSIS — J019 Acute sinusitis, unspecified: Secondary | ICD-10-CM | POA: Diagnosis not present

## 2018-11-19 ENCOUNTER — Other Ambulatory Visit: Payer: Self-pay | Admitting: Cardiovascular Disease

## 2018-11-24 ENCOUNTER — Ambulatory Visit (INDEPENDENT_AMBULATORY_CARE_PROVIDER_SITE_OTHER): Payer: Medicare Other | Admitting: Pharmacist

## 2018-11-24 DIAGNOSIS — Z7901 Long term (current) use of anticoagulants: Secondary | ICD-10-CM | POA: Diagnosis not present

## 2018-11-24 DIAGNOSIS — I4821 Permanent atrial fibrillation: Secondary | ICD-10-CM

## 2018-11-24 LAB — POCT INR: INR: 2.2 (ref 2.0–3.0)

## 2018-11-24 NOTE — Patient Instructions (Signed)
Description   Continue taking your same dosage 1 tablet daily except 1.5 tablets on Sundays and Wednesdays. Recheck in 3 weeks. Call us with any update or changes [phone].

## 2018-12-03 DIAGNOSIS — N5089 Other specified disorders of the male genital organs: Secondary | ICD-10-CM | POA: Diagnosis not present

## 2018-12-04 ENCOUNTER — Other Ambulatory Visit: Payer: Self-pay | Admitting: Nurse Practitioner

## 2018-12-04 DIAGNOSIS — N5089 Other specified disorders of the male genital organs: Secondary | ICD-10-CM

## 2018-12-04 DIAGNOSIS — Z8546 Personal history of malignant neoplasm of prostate: Secondary | ICD-10-CM | POA: Diagnosis not present

## 2018-12-04 DIAGNOSIS — Z8042 Family history of malignant neoplasm of prostate: Secondary | ICD-10-CM

## 2018-12-09 ENCOUNTER — Ambulatory Visit
Admission: RE | Admit: 2018-12-09 | Discharge: 2018-12-09 | Disposition: A | Discharge status: Home / Self Care | Payer: Medicare Other | Source: Ambulatory Visit | Attending: Nurse Practitioner | Admitting: Nurse Practitioner

## 2018-12-09 DIAGNOSIS — Z8042 Family history of malignant neoplasm of prostate: Secondary | ICD-10-CM

## 2018-12-09 DIAGNOSIS — N2889 Other specified disorders of kidney and ureter: Secondary | ICD-10-CM | POA: Diagnosis not present

## 2018-12-09 DIAGNOSIS — N5089 Other specified disorders of the male genital organs: Secondary | ICD-10-CM

## 2018-12-09 MED ORDER — IOPAMIDOL (ISOVUE-300) INJECTION 61%
100.0000 mL | Freq: Once | INTRAVENOUS | Status: AC | PRN
  Administered 2018-12-09: 100 mL via INTRAVENOUS

## 2018-12-10 DIAGNOSIS — N5089 Other specified disorders of the male genital organs: Secondary | ICD-10-CM | POA: Diagnosis not present

## 2018-12-11 DIAGNOSIS — R300 Dysuria: Secondary | ICD-10-CM | POA: Diagnosis not present

## 2018-12-15 DIAGNOSIS — D3001 Benign neoplasm of right kidney: Secondary | ICD-10-CM | POA: Diagnosis not present

## 2018-12-15 DIAGNOSIS — N5089 Other specified disorders of the male genital organs: Secondary | ICD-10-CM | POA: Diagnosis not present

## 2018-12-15 DIAGNOSIS — Z8546 Personal history of malignant neoplasm of prostate: Secondary | ICD-10-CM | POA: Diagnosis not present

## 2018-12-16 ENCOUNTER — Ambulatory Visit (INDEPENDENT_AMBULATORY_CARE_PROVIDER_SITE_OTHER): Payer: Medicare Other

## 2018-12-16 DIAGNOSIS — I4821 Permanent atrial fibrillation: Secondary | ICD-10-CM

## 2018-12-16 DIAGNOSIS — Z7901 Long term (current) use of anticoagulants: Secondary | ICD-10-CM | POA: Diagnosis not present

## 2018-12-16 LAB — POCT INR: INR: 2.1 (ref 2.0–3.0)

## 2018-12-16 NOTE — Patient Instructions (Signed)
Description   Continue taking your same dosage 1 tablet daily except 1.5 tablets on Sundays and Wednesdays. Recheck in 4 weeks. Call us with any update or changes [phone].

## 2018-12-17 ENCOUNTER — Ambulatory Visit (INDEPENDENT_AMBULATORY_CARE_PROVIDER_SITE_OTHER): Payer: Medicare Other | Admitting: Cardiovascular Disease

## 2018-12-17 ENCOUNTER — Encounter: Payer: Self-pay | Admitting: Cardiovascular Disease

## 2018-12-17 VITALS — BP 126/76 | HR 85 | Ht 71.0 in | Wt 189.8 lb

## 2018-12-17 DIAGNOSIS — I5032 Chronic diastolic (congestive) heart failure: Secondary | ICD-10-CM | POA: Diagnosis not present

## 2018-12-17 DIAGNOSIS — I340 Nonrheumatic mitral (valve) insufficiency: Secondary | ICD-10-CM

## 2018-12-17 NOTE — Progress Notes (Signed)
Cardiology Office Note   Date:  12/17/2018   ID:  Daniel Love, DOB 1940-06-04, MRN [id number]  PCP:  Chesley Noon, MD  Cardiologist: Darlin Coco MD / Danylle Ouk   Chief Complaint  Patient presents with  . Atrial Fibrillation  . Hypertension   Problem list 1. Chronic atrial fibrillation 2. Essential hypertension 3.   July 12, 2015 - notes from Daniel Love is a 78 y.o. male who presents for a one-year follow-up visit  This 78 year old gentleman is seen for a one-year followup office visit. He has a history of permanent atrial fibrillation. We initially saw him in December 2010 at which time he was in atrial fibrillation. He underwent cardioversion on 01/27/10 successfully. He was then lost to followup until 05/14/13 at which time he returned in atrial fibrillation. Over that period of time he continued to get his prothrombin time faithfully. He is asymptomatic in terms of his atrial fibrillation. Not aware of his heart rate. He does have occasional mild lightheaded spells which may be related to his bradycardia. He's had a past history of hypertension.  He denies any chest pain or shortness of breath.  Energy level is satisfactory.  He does not have any history of ischemic heart disease or TIA or stroke  July 20, 2016:  Daniel Love is seen today  Has chronic atrial fib with slow HR  No syncope or presyncope  Aug. 31, 2018  Has rare episodes of chest congestion  / bronchitis, Feels like he needs to cough.  Walks about a mile a day without any problems.   December 17, 2018: Seen with wife and  Daughter , Daniel Love   Seen today for follow-up of his atrial fibrillation.  He remains on Coumadin. Is been started on Lasix 20 mg a day and isosorbide 15 mg a day since I last saw him. He was having lots of scrotum .  Finished his prostate therapy   ( XRT, Lupron)  In August Has had lots of leg edema since that time   Daniel Love saw him and did a pelvic  CT scan to explore the edema Was found to have a 2.3 cm renal mass ( likely cancer )     Past Medical History:  Diagnosis Date  . Anal fissure    hx of   . Atrial fibrillation (HCC)    maintaining normal sinus rhythm  . Chronic anticoagulation    coumadin  . Dyspepsia   . GERD (gastroesophageal reflux disease)   . Glaucoma   . History of colon polyps 2012   Colonoscopy-Dr. Benson [place]   . History of echocardiogram    Echocardiogram 2/19: EF 60-65, normal wall motion, trivial AI, moderate to severe MR, mild LAE, mild RAE, mild to moderate TR, PASP 35  . History of kidney stones   . History of nuclear stress test    Myoview 2/19: EF 42, diffuse HK no significant ischemia, apical lateral scar  . Hypertension   . Prostate cancer ([place])   . Skin cancer    skin cancer removed from left arm and back    Past Surgical History:  Procedure Laterality Date  . BACK SURGERY  12/31/1977   for degenerative disc disease  . COLONOSCOPY    . PROSTATE BIOPSY    . UPPER GASTROINTESTINAL ENDOSCOPY       Current Outpatient Medications  Medication Sig Dispense Refill  . amLODipine (NORVASC) 5 MG tablet TAKE ONE TABLET BY MOUTH DAILY 90 tablet  3  . colchicine 0.6 MG tablet Take 0.6 mg by mouth daily as needed (gout).     . furosemide (LASIX) 20 MG tablet Take 20 mg by mouth 2 (two) times daily.    . isosorbide mononitrate (IMDUR) 30 MG 24 hr tablet Take 1 tablet by mouth daily.    [name] LUMIGAN 0.01 % SOLN Place 1 drop into both eyes daily.    . Methylcellulose, Laxative, (CITRUCEL PO) Take by mouth as needed (for constipation).     . mirabegron ER (MYRBETRIQ) 25 MG TB24 tablet Take 25 mg by mouth daily.    . Multiple Vitamins-Minerals (CENTRUM SILVER PO) Take 1 tablet by mouth every morning.      . nystatin cream (MYCOSTATIN) Apply 1 application topically as needed (RASH).     [name] olmesartan-hydrochlorothiazide (BENICAR HCT) 20-12.5 MG tablet Take 1 tablet by mouth daily. 90 tablet 3  . omeprazole  (PRILOSEC) 40 MG capsule Take 40 mg by mouth daily as needed.     . warfarin (COUMADIN) 5 MG tablet TAKE AS DIRECTED BY COUMADIN CLINIC 120 tablet 0   Current Facility-Administered Medications  Medication Dose Route Frequency Provider Last Rate Last Dose  . 0.9 %  sodium chloride infusion  500 mL Intravenous Continuous Milus Banister, MD        Allergies:   Amlodipine and Lisinopril    Social History:  The patient  reports that he quit smoking about 51 years ago. His smoking use included cigarettes. He has a 7.00 pack-year smoking history. He has never used smokeless tobacco. He reports current alcohol use of about 7.0 standard drinks of alcohol per week. He reports that he does not use drugs.   Family History:  The patient's family history includes Cancer in his father and mother; Colon cancer in his maternal grandmother; Heart attack in his father; Heart failure in his father.    ROS:  Please see the history of present illness.   Otherwise, review of systems are positive for none.   All other systems are reviewed and negative.    Physical Exam: Blood pressure 126/76, pulse 85, height 5\' 11"  (1.803 m), weight 189 lb 12.8 oz (86.1 kg), SpO2 96 %.  GEN: Elderly gentleman, no acute distress HEENT: Normal NECK: No JVD; No carotid bruits LYMPHATICS: No lymphadenopathy CARDIAC: Irreg.   irregular S1-S2.  Soft systolic murmur RESPIRATORY:  Clear to auscultation without rales, wheezing or rhonchi  ABDOMEN: Soft, non-tender, non-distended MUSCULOSKELETAL: 1+ edema SKIN: Warm and dry NEUROLOGIC:  Alert and oriented x 3   EKG: December 17, 2018: Atrial fibrillation with a heart rate of 85. Occasional premature ventricular contraction.   Recent Labs: 02/12/2018: BUN 26; Creatinine, Ser 1.33; Potassium 4.1; Sodium 142; TSH 2.970    Lipid Panel No results found for: CHOL, TRIG, HDL, CHOLHDL, VLDL, LDLCALC, LDLDIRECT    Wt Readings from Last 3 Encounters:  12/17/18 189 lb 12.8 oz  (86.1 kg)  09/26/18 187 lb 3.2 oz (84.9 kg)  08/21/18 185 lb 12.8 oz (84.3 kg)        ASSESSMENT AND PLAN:  1. Atrial fibrillation:      Has persistent AF.  Has been cardioverted several times - did not last  2.   Chronic diastolic CHF:    Mod - severe MR  Had significant leg edema   3.  Mitral regurgitation.  He has developed congestive heart failure symptoms over the past 2 to 3 weeks.  His last echocardiogram was a year ago  which revealed moderate to severe mitral regurgitation.  Is possible that the mitral regurgitation has acutely worsened. He seems to be responding well to the Lasix. New Lasix 20 mg a day.   .  We will get a new echocardiogram and may need to get a transesophageal echocardiogram for further evaluation. Since he has what seems to be a renal cell carcinoma, I do not think that he is a candidate for mitral valve replacement but he might be a candidate for mitral clip.  2. Essential HTN:      Mertie Moores, MD  12/17/2018 2:19 PM    [facility] [place],  [place], [place]  [address] Pager [phone] Phone: [phone]; Fax: [phone]

## 2018-12-17 NOTE — Patient Instructions (Signed)
Medication Instructions:  Your physician has recommended you make the following change in your medication:  DECREASE Furosemide (Lasix) to 20 mg once daily  If you need a refill on your cardiac medications before your next appointment, please call your pharmacy.   Lab work: Your physician recommends that you return for lab work in: 2 weeks for basic metabolic panel    Testing/Procedures: Your physician has requested that you have an echocardiogram. Echocardiography is a painless test that uses sound waves to create images of your heart. It provides your doctor with information about the size and shape of your heart and how well your heart's chambers and valves are working. This procedure takes approximately one hour. There are no restrictions for this procedure.    Follow-Up: At [facility], you and your health needs are our priority.  As part of our continuing mission to provide you with exceptional heart care, we have created designated Provider Care Teams.  These Care Teams include your primary Cardiologist (physician) and Advanced Practice Providers (APPs -  Physician Assistants and Nurse Practitioners) who all work together to provide you with the care you need, when you need it. You will need a follow up appointment in:  2 months.  You may see Mertie Moores, MD or one of the following Advanced Practice Providers on your designated Care Team: Richardson Dopp, PA-C [place], [place] . Daune Perch, NP

## 2018-12-18 NOTE — Addendum Note (Signed)
Addended by: Mendel Ryder on: 12/18/2018 07:51 AM   Modules accepted: Orders

## 2019-01-02 ENCOUNTER — Ambulatory Visit (HOSPITAL_COMMUNITY): Payer: Medicare Other | Attending: Cardiology

## 2019-01-02 ENCOUNTER — Other Ambulatory Visit: Payer: Self-pay

## 2019-01-02 ENCOUNTER — Other Ambulatory Visit: Payer: Medicare Other | Admitting: *Deleted

## 2019-01-02 DIAGNOSIS — I340 Nonrheumatic mitral (valve) insufficiency: Secondary | ICD-10-CM

## 2019-01-02 DIAGNOSIS — I5032 Chronic diastolic (congestive) heart failure: Secondary | ICD-10-CM | POA: Diagnosis not present

## 2019-01-02 LAB — BASIC METABOLIC PANEL
BUN/Creatinine Ratio: 21 (ref 10–24)
BUN: 22 mg/dL (ref 8–27)
CO2: 23 mmol/L (ref 20–29)
CREATININE: 1.06 mg/dL (ref 0.76–1.27)
Calcium: 8.8 mg/dL (ref 8.6–10.2)
Chloride: 102 mmol/L (ref 96–106)
GFR calc Af Amer: 77 mL/min/{1.73_m2} (ref >59)
GFR, EST NON AFRICAN AMERICAN: 67 mL/min/{1.73_m2} (ref >59)
Glucose: 82 mg/dL (ref 65–99)
Potassium: 3.6 mmol/L (ref 3.5–5.2)
Sodium: 141 mmol/L (ref 134–144)

## 2019-01-07 ENCOUNTER — Telehealth: Payer: Self-pay | Admitting: Cardiovascular Disease

## 2019-01-07 ENCOUNTER — Telehealth: Payer: Self-pay | Admitting: *Deleted

## 2019-01-07 DIAGNOSIS — R0602 Shortness of breath: Secondary | ICD-10-CM

## 2019-01-07 DIAGNOSIS — R600 Localized edema: Secondary | ICD-10-CM

## 2019-01-07 NOTE — Telephone Encounter (Signed)
   I discussed the case with Dr. Roselie Awkward, lobar pulmonary  We will get a chest CT angiogram to evaluate him for pulmonary emboli. There is a possibility that he may have thrombus or originating out of his renal veins and up into his IVC to explain his leg edema. Venous duplex scan should be able to identify that.  We will schedule him for a   1.  CT angiogram of the chest to evaluate for pulmonary emboli He will need a basic metabolic profile 1 week prior to his CT angiogram.  2.  Bilateral lower extremity venous duplex scan to evaluate for DVT.     Mertie Moores, MD  01/07/2019 2:58 PM    [address],  [place], [place]  [address] Pager [phone] Phone: [phone]; Fax: [phone]

## 2019-01-07 NOTE — Telephone Encounter (Signed)
Called [name] to discuss getting a chest CT angio. Left a message for her to call back  [name] has had an increase in his pulmonary pressures - with no specific cause. Would like to discuss further with him and [name] but this increase in pulmonary pressures could be due to chronic pulmonary emboli  His pulmonary pressure is moderately elevated.  Will need a BMP prior to contrast injection    Mertie Moores, MD  01/07/2019 1:25 PM    [facility] [address],  [place], [place]  [address] Pager [phone] Phone: [phone]; Fax: [phone]

## 2019-01-07 NOTE — Addendum Note (Signed)
Addended by: Devra Dopp E on: 01/07/2019 03:37 PM   Modules accepted: Orders

## 2019-01-07 NOTE — Telephone Encounter (Signed)
Test orders entered and pt's wife aware scheduling will call to make appts./cy

## 2019-01-09 ENCOUNTER — Ambulatory Visit (HOSPITAL_COMMUNITY)
Admission: RE | Admit: 2019-01-09 | Discharge: 2019-01-09 | Disposition: A | Discharge status: Home / Self Care | Payer: Medicare Other | Source: Ambulatory Visit | Attending: Cardiovascular Disease | Admitting: Cardiovascular Disease

## 2019-01-09 DIAGNOSIS — R600 Localized edema: Secondary | ICD-10-CM | POA: Insufficient documentation

## 2019-01-09 DIAGNOSIS — R0602 Shortness of breath: Secondary | ICD-10-CM | POA: Insufficient documentation

## 2019-01-09 NOTE — Telephone Encounter (Signed)
error 

## 2019-01-13 ENCOUNTER — Ambulatory Visit (INDEPENDENT_AMBULATORY_CARE_PROVIDER_SITE_OTHER): Payer: Medicare Other

## 2019-01-13 ENCOUNTER — Ambulatory Visit (INDEPENDENT_AMBULATORY_CARE_PROVIDER_SITE_OTHER)
Admission: RE | Admit: 2019-01-13 | Discharge: 2019-01-13 | Disposition: A | Discharge status: Home / Self Care | Payer: Medicare Other | Source: Ambulatory Visit | Attending: Cardiovascular Disease | Admitting: Cardiovascular Disease

## 2019-01-13 ENCOUNTER — Telehealth: Payer: Self-pay | Admitting: Nurse Practitioner

## 2019-01-13 DIAGNOSIS — I7123 Aneurysm of the descending thoracic aorta, without rupture: Secondary | ICD-10-CM

## 2019-01-13 DIAGNOSIS — I4821 Permanent atrial fibrillation: Secondary | ICD-10-CM

## 2019-01-13 DIAGNOSIS — I712 Thoracic aortic aneurysm, without rupture: Secondary | ICD-10-CM

## 2019-01-13 DIAGNOSIS — R0602 Shortness of breath: Secondary | ICD-10-CM | POA: Diagnosis not present

## 2019-01-13 DIAGNOSIS — Z7901 Long term (current) use of anticoagulants: Secondary | ICD-10-CM | POA: Diagnosis not present

## 2019-01-13 LAB — POCT INR: INR: 1.9 — AB (ref 2.0–3.0)

## 2019-01-13 MED ORDER — IOPAMIDOL (ISOVUE-370) INJECTION 76%
80.0000 mL | Freq: Once | INTRAVENOUS | Status: AC | PRN
  Administered 2019-01-13: 80 mL via INTRAVENOUS

## 2019-01-13 NOTE — Telephone Encounter (Signed)
-----   Message from Thayer Headings, MD sent at 01/13/2019  5:43 PM EST ----- Daniel Love originally presented about a month ago with increased leg swelling.  Echocardiogram revealed very mild left ventricular dysfunction but revealed moderate pulmonary hypertension.  CT angiogram of the chest was ordered to look for pulmonary embolus.  It was negative for pulmonary embolus but it did reveal a moderate sized ascending aortic aneurysm.  Daniel Love has  been found to have a renal mass.  We need to get some understanding of whether this renal mass is cancerous or not.  Eventually, we will need to refer him to the cardiothoracic surgeons have this aneurysm evaluated.  Would like to find out more about the renal mass and his prognosis before we refer him to [facility].  Continue diuretics. He is having follow-up with [place] in the next 4 to 6 weeks with me or an APP.

## 2019-01-13 NOTE — Telephone Encounter (Signed)
Reviewed results with patient's daughter, [name], who verbalized understanding. I answered questions to her satisfaction and ordered referral to [facility] per her request. She is aware their office will call to schedule appointment. I asked about patient's work-up for renal mass and she advised that mass is 2.5 cm and that follow-up with Dr. [name] is scheduled for March. I advised that patient keep follow-up appointment with our office on 2/24 and to call back with additional questions or concerns. She verbalized understanding and agreement with plan and thanked me for the call.

## 2019-01-13 NOTE — Patient Instructions (Signed)
Please take extra 1/2 tablet tonight, then continue taking your same dosage 1 tablet daily except 1.5 tablets on Sundays and Wednesdays. Recheck in 4 weeks. Call us with any update or changes [phone].

## 2019-01-27 ENCOUNTER — Other Ambulatory Visit: Payer: Self-pay | Admitting: Urology

## 2019-01-27 DIAGNOSIS — D3001 Benign neoplasm of right kidney: Secondary | ICD-10-CM

## 2019-01-29 DIAGNOSIS — D485 Neoplasm of uncertain behavior of skin: Secondary | ICD-10-CM | POA: Diagnosis not present

## 2019-01-29 DIAGNOSIS — L812 Freckles: Secondary | ICD-10-CM | POA: Diagnosis not present

## 2019-01-29 DIAGNOSIS — Z85828 Personal history of other malignant neoplasm of skin: Secondary | ICD-10-CM | POA: Diagnosis not present

## 2019-01-29 DIAGNOSIS — L570 Actinic keratosis: Secondary | ICD-10-CM | POA: Diagnosis not present

## 2019-01-29 DIAGNOSIS — L821 Other seborrheic keratosis: Secondary | ICD-10-CM | POA: Diagnosis not present

## 2019-01-29 DIAGNOSIS — D1801 Hemangioma of skin and subcutaneous tissue: Secondary | ICD-10-CM | POA: Diagnosis not present

## 2019-01-29 DIAGNOSIS — C44619 Basal cell carcinoma of skin of left upper limb, including shoulder: Secondary | ICD-10-CM | POA: Diagnosis not present

## 2019-02-04 ENCOUNTER — Encounter: Payer: Self-pay | Admitting: Surgery

## 2019-02-04 ENCOUNTER — Institutional Professional Consult (permissible substitution) (INDEPENDENT_AMBULATORY_CARE_PROVIDER_SITE_OTHER): Payer: Medicare Other | Admitting: Surgery

## 2019-02-04 ENCOUNTER — Other Ambulatory Visit: Payer: Self-pay

## 2019-02-04 VITALS — BP 130/82 | HR 88 | Resp 18 | Ht 71.0 in | Wt 184.8 lb

## 2019-02-04 DIAGNOSIS — I712 Thoracic aortic aneurysm, without rupture, unspecified: Secondary | ICD-10-CM

## 2019-02-04 NOTE — Progress Notes (Signed)
Cardiothoracic Surgery Consultation  PCP is Chesley Noon, MD Referring Provider is Nahser, Wonda Cheng, MD  Chief Complaint  Patient presents with  . Thoracic Aortic Aneurysm    new patient consultation, CTA chest 01/13/2019, ECHO 01/02/2019    HPI:  The patient is a 79 year old gentleman with a history of hypertension, atrial fibrillation on chronic Coumadin therapy, prostate cancer status post chemotherapy and XRT in June 2019, moderate to severe mitral regurgitation by echocardiogram in February 2019 who presented in August 2019 with complaints of increased lower extremity edema which progressed to involve his scrotum.  He was seen by Dr. Acie Fredrickson in December 2019 and diagnosed with likely chronic diastolic congestive heart failure and treated with Lasix with improvement.  A repeat echocardiogram on 01/02/2019 showed moderate mitral regurgitation with mild aortic insufficiency and mild left ventricular systolic dysfunction with ejection fraction of 45 to 50% with diffuse hypokinesis.  His pulmonary pressures were felt to be elevated.  Lower extremity venous Dopplers were negative for DVT.  A CTA of abdomen and pelvis on 12/09/2018 showed a 2.3 cm rounded enhancing mass in the midpole of the right kidney concerning for renal cell carcinoma.  He has subsequently been seen by his urologist Dr. [name] concerning this.  A CTA of the chest was performed on 01/13/2019 which showed no pulmonary embolism.  There was a 4.6 x 4.6 cm fusiform ascending aortic aneurysm with no evidence of aortic dissection.  There was some calcifications in the left main, LAD and left circumflex coronary arteries.   The patient is here today with his wife and daughter.  He said that he remains active and takes long walks without any chest pain or shortness of breath.  He feels like his stamina has been decreased.  He still has some lower extremity edema but it is improved with diuretics and use of lower extremity compression  stockings.   Past Medical History:  Diagnosis Date  . Anal fissure    hx of   . Atrial fibrillation (HCC)    maintaining normal sinus rhythm  . Chronic anticoagulation    coumadin  . Dyspepsia   . GERD (gastroesophageal reflux disease)   . Glaucoma   . History of colon polyps 2012   Colonoscopy-Dr. Benson Norway   . History of echocardiogram    Echocardiogram 2/19: EF 60-65, normal wall motion, trivial AI, moderate to severe MR, mild LAE, mild RAE, mild to moderate TR, PASP 35  . History of kidney stones   . History of nuclear stress test    Myoview 2/19: EF 42, diffuse HK no significant ischemia, apical lateral scar  . Hypertension   . Prostate cancer ([place])   . Skin cancer    skin cancer removed from left arm and back    Past Surgical History:  Procedure Laterality Date  . BACK SURGERY  12/31/1977   for degenerative disc disease  . COLONOSCOPY    . PROSTATE BIOPSY    . UPPER GASTROINTESTINAL ENDOSCOPY      Family History  Problem Relation Age of Onset  . Heart failure Father   . Cancer Father        associated with her lungs  . Heart attack Father   . Cancer Mother        uncertain/possibly lung  . Colon cancer Maternal Grandmother   . Stroke Neg Hx   . Esophageal cancer Neg Hx   . Rectal cancer Neg Hx   . Stomach cancer Neg Hx  Social History Social History   Tobacco Use  . Smoking status: Former Smoker    Packs/day: 1.00    Years: 7.00    Pack years: 7.00    Types: Cigarettes    Last attempt to quit: 12/31/1966    Years since quitting: 52.1  . Smokeless tobacco: Never Used  Substance Use Topics  . Alcohol use: Yes    Alcohol/week: 7.0 standard drinks    Types: 7 Glasses of wine per week    Comment: 1 daily   . Drug use: No    Current Outpatient Medications  Medication Sig Dispense Refill  . amLODipine (NORVASC) 5 MG tablet TAKE ONE TABLET BY MOUTH DAILY 90 tablet 3  . colchicine 0.6 MG tablet Take 0.6 mg by mouth daily as needed (gout).     .  isosorbide mononitrate (IMDUR) 30 MG 24 hr tablet Take 1 tablet by mouth daily.    [name] LUMIGAN 0.01 % SOLN Place 1 drop into both eyes daily.    . Methylcellulose, Laxative, (CITRUCEL PO) Take by mouth as needed (for constipation).     . mirabegron ER (MYRBETRIQ) 25 MG TB24 tablet Take 25 mg by mouth daily.    . Multiple Vitamins-Minerals (CENTRUM SILVER PO) Take 1 tablet by mouth every morning.      . nystatin cream (MYCOSTATIN) Apply 1 application topically as needed (RASH).     [name] olmesartan-hydrochlorothiazide (BENICAR HCT) 20-12.5 MG tablet Take 1 tablet by mouth daily. 90 tablet 3  . omeprazole (PRILOSEC) 40 MG capsule Take 40 mg by mouth daily as needed.     . warfarin (COUMADIN) 5 MG tablet TAKE AS DIRECTED BY COUMADIN CLINIC 120 tablet 0  . furosemide (LASIX) 20 MG tablet Take 20 mg by mouth 2 (two) times daily.     Current Facility-Administered Medications  Medication Dose Route Frequency Provider Last Rate Last Dose  . 0.9 %  sodium chloride infusion  500 mL Intravenous Continuous Milus Banister, MD        Allergies  Allergen Reactions  . Amlodipine Hives    Reaction-edema  . Lisinopril Cough    Review of Systems  Constitutional: Positive for fatigue.  HENT: Negative.   Eyes: Negative.   Respiratory: Negative for chest tightness and shortness of breath.   Cardiovascular: Positive for palpitations and leg swelling. Negative for chest pain.  Gastrointestinal:       Reflux and some difficulty swallowing.  Genitourinary: Positive for frequency.  Musculoskeletal: Negative.   Skin: Negative.   Allergic/Immunologic: Negative.   Neurological: Negative.  Negative for dizziness and syncope.  Hematological: Bruises/bleeds easily.  Psychiatric/Behavioral: Negative.     BP 130/82 (BP Location: Right Arm, Patient Position: Sitting, Cuff Size: Large)   Pulse 88   Resp 18   Ht 5\' 11"  (1.803 m)   Wt 184 lb 12.8 oz (83.8 kg)   SpO2 98% Comment: RA  BMI 25.77 kg/m  Physical  Exam Constitutional:      Appearance: Normal appearance. He is normal weight.  HENT:     Head: Normocephalic and atraumatic.     Nose: Nose normal.     Mouth/Throat:     Mouth: Mucous membranes are moist.     Pharynx: Oropharynx is clear.  Eyes:     Extraocular Movements: Extraocular movements intact.     Conjunctiva/sclera: Conjunctivae normal.     Pupils: Pupils are equal, round, and reactive to light.  Neck:     Musculoskeletal: Normal range of motion and  neck supple.     Vascular: No carotid bruit.  Cardiovascular:     Rate and Rhythm: Normal rate. Rhythm irregular.     Pulses: Normal pulses.     Heart sounds: Normal heart sounds. No murmur.  Pulmonary:     Effort: Pulmonary effort is normal.     Breath sounds: Normal breath sounds. No rales.  Abdominal:     General: Abdomen is flat.     Palpations: Abdomen is soft.  Musculoskeletal:        General: Swelling present.     Right lower leg: Edema present.     Left lower leg: Edema present.  Lymphadenopathy:     Cervical: No cervical adenopathy.  Skin:    General: Skin is warm and dry.     Coloration: Skin is not jaundiced.  Neurological:     General: No focal deficit present.     Mental Status: He is alert.  Psychiatric:        Mood and Affect: Mood normal.        Behavior: Behavior normal.        Thought Content: Thought content normal.        Judgment: Judgment normal.      Diagnostic Tests:       [facility]                        [address], [place] [address]                            [phone]  ------------------------------------------------------------------- Transthoracic Echocardiography  Patient:    Nikkolas, Coomes MR #:       [id number] Study Date: 01/02/2019 Gender:     M Age:        13 Height:     180.3 cm Weight:     86.1 kg BSA:        2.09 m^2 Pt. Status: Room:   REFERRING    Mertie Moores, M.D.  ATTENDING    Kirk Ruths   SONOGRAPHER  [place]  ORDERING     Nahser, Yvette Rack    Nahser, Jr  cc:  ------------------------------------------------------------------- LV EF: 45% -   50%  ------------------------------------------------------------------- Indications:      I05.9 Mitral valve disorder.  ------------------------------------------------------------------- History:   PMH:   Congestive heart failure.  ------------------------------------------------------------------- Study Conclusions  - Left ventricle: The cavity size was normal. Wall thickness was   increased in a pattern of moderate LVH. Systolic function was   mildly reduced. The estimated ejection fraction was in the range   of 45% to 50%. Diffuse hypokinesis. There was no evidence of   elevated ventricular filling pressure by Doppler parameters. - Aortic valve: There was mild regurgitation. - Mitral valve: There was moderate regurgitation. - Left atrium: The atrium was moderately dilated. - Right ventricle: The cavity size was mildly dilated. Wall   thickness was normal. - Right atrium: The atrium was severely dilated. - Tricuspid valve: There was moderate regurgitation. - Pulmonary arteries: Systolic pressure was mildly increased. PA   peak pressure: 44 mm Hg (S).  ------------------------------------------------------------------- Study data:  Comparison was made to the study of 02/24/2018.  Study status:  Routine.  Procedure:  The  patient reported no pain pre or post test. Transthoracic echocardiography. Image quality was adequate.  Study completion:  There were no complications. Transthoracic echocardiography.  M-mode, complete 2D, spectral Doppler, and color Doppler.  Birthdate:  Patient birthdate: 27-Jul-1940.  Age:  Patient is 79 yr old.  Sex:  Gender: male. BMI: 26.5 kg/m^2.  Blood pressure:     140/71  Patient status: Outpatient.  Study date:  Study date: 01/02/2019. Study  time: 01:11 PM.  Location:  [facility]  -------------------------------------------------------------------  ------------------------------------------------------------------- Left ventricle:  The cavity size was normal. Wall thickness was increased in a pattern of moderate LVH. Systolic function was mildly reduced. The estimated ejection fraction was in the range of 45% to 50%. Diffuse hypokinesis. There was no evidence of elevated ventricular filling pressure by Doppler parameters.  ------------------------------------------------------------------- Aortic valve:   Trileaflet; normal thickness leaflets. Mobility was not restricted.  Doppler:  Transvalvular velocity was within the normal range. There was no stenosis. There was mild regurgitation.   ------------------------------------------------------------------- Aorta:  Aortic root: The aortic root was normal in size.  ------------------------------------------------------------------- Mitral valve:   Mildly thickened leaflets . Mobility was not restricted.  Doppler:  Transvalvular velocity was within the normal range. There was no evidence for stenosis. There was moderate regurgitation.  ------------------------------------------------------------------- Left atrium:  The atrium was moderately dilated.  ------------------------------------------------------------------- Right ventricle:  The cavity size was mildly dilated. Wall thickness was normal. Systolic function was normal.  ------------------------------------------------------------------- Pulmonic valve:   Poorly visualized.  Structurally normal valve. Cusp separation was normal.  Doppler:  Transvalvular velocity was within the normal range. There was no evidence for stenosis. There was no regurgitation.  ------------------------------------------------------------------- Tricuspid valve:   Structurally normal valve.    Doppler: Transvalvular  velocity was within the normal range. There was moderate regurgitation.  ------------------------------------------------------------------- Pulmonary artery:   The main pulmonary artery was normal-sized. Systolic pressure was mildly increased.  ------------------------------------------------------------------- Right atrium:  The atrium was severely dilated.  ------------------------------------------------------------------- Pericardium:  There was no pericardial effusion.  ------------------------------------------------------------------- Systemic veins: Inferior vena cava: The vessel was normal in size.  ------------------------------------------------------------------- Measurements   Left ventricle                           Value        Reference  LV ID, ED, PLAX chordal                  46    mm     43 - 52  LV ID, ES, PLAX chordal                  37    mm     23 - 38  LV fx shortening, PLAX chordal   (L)     20    %      >=29  LV PW thickness, ED                      16.1  mm     ----------  IVS/LV PW ratio, ED                      0.94         <=1.3  LV e&', lateral                           12.3  cm/s   ----------  LV e&', medial                            7.65  cm/s   ----------  LV e&', average                           9.98  cm/s   ----------    Ventricular septum                       Value        Reference  IVS thickness, ED                        15.1  mm     ----------    LVOT                                     Value        Reference  LVOT peak velocity, S                    73.7  cm/s   ----------  LVOT mean velocity, S                    46.6  cm/s   ----------  LVOT VTI, S                              14.6  cm     ----------    Aortic valve                             Value        Reference  Aortic regurg peak velocity              192   cm/s   ----------  Aortic regurg pressure half-time         499   ms     ----------  Aortic regurg peak  gradient              15    mm Hg  ----------    Left atrium                              Value        Reference  LA ID, A-P, ES                           47    mm     ----------  LA ID/bsa, A-P                   (H)     2.25  cm/m^2 <=2.2  LA volume, S                             99.9  ml     ----------  LA volume/bsa, S                         47.8  ml/m^2 ----------  LA volume, ES,  1-p A4C                   95.1  ml     ----------  LA volume/bsa, ES, 1-p A4C               45.5  ml/m^2 ----------  LA volume, ES, 1-p A2C                   98.4  ml     ----------  LA volume/bsa, ES, 1-p A2C               47.1  ml/m^2 ----------    Mitral valve                             Value        Reference  Mitral regurg VTI, PISA                  139   cm     ----------  Mitral ERO, PISA                         0.13  cm^2   ----------  Mitral regurg volume, PISA               18    ml     ----------    Pulmonary arteries                       Value        Reference  PA pressure, S, DP               (H)     44    mm Hg  <=30    Tricuspid valve                          Value        Reference  Tricuspid regurg peak velocity           269   cm/s   ----------  Tricuspid peak RV-RA gradient            29    mm Hg  ----------    Right atrium                             Value        Reference  RA ID, S-I, ES, A4C              (H)     70.3  mm     34 - 49  RA area, ES, A4C                 (H)     30.6  cm^2   8.3 - 19.5  RA volume, ES, A/L                       108   ml     ----------  RA volume/bsa, ES, A/L                   51.7  ml/m^2 ----------    Systemic veins  Value        Reference  Estimated CVP                            15    mm Hg  ----------    Right ventricle                          Value        Reference  RV pressure, S, DP               (H)     44    mm Hg  <=30  RV s&', lateral, S                        11.1  cm/s   ----------  Legend: (L)  and  (H)  mark  values outside specified reference range.  ------------------------------------------------------------------- Prepared and Electronically Authenticated by  Candee Furbish, M.D. 2020-01-03T16:46:33  CLINICAL DATA:  Shortness of breath.  History of prostate carcinoma  EXAM: CT ANGIOGRAPHY CHEST WITH CONTRAST  TECHNIQUE: Multidetector CT imaging of the chest was performed using the standard protocol during bolus administration of intravenous contrast. Multiplanar CT image reconstructions and MIPs were obtained to evaluate the vascular anatomy.  CONTRAST:  41mL ISOVUE-370 IOPAMIDOL (ISOVUE-370) INJECTION 76%  COMPARISON:  Chest radiograph January 23, 2010  FINDINGS: Cardiovascular: There is no demonstrable pulmonary embolus. There is dilatation of the ascending thoracic aorta with a maximum transverse diameter of 4.6 x 4.6 cm. No dissection is appreciable. Note that the contrast bolus in the aorta is not sufficient to assess for thoracic aortic dissection. Visualized great vessels appear unremarkable. There is no pericardial effusion or pericardial thickening. There are foci of carotid artery calcification.  Mediastinum/Nodes: Thyroid appears unremarkable. There is no appreciable thoracic adenopathy. No esophageal lesions are evident.  Lungs/Pleura: There is a fairly small free-flowing left pleural effusion with left base atelectasis. There is a calcified granuloma in the posterior right lung base. There is no appreciable edema or consolidation.  Upper Abdomen: Visualized upper abdominal structures appear unremarkable except for foci of major mesenteric arterial vascular calcification.  Musculoskeletal: There are foci of degenerative change in the thoracic spine. There are no blastic or lytic bone lesions. There are no evident chest wall lesions.  Review of the MIP images confirms the above findings.  IMPRESSION: 1. No demonstrable pulmonary embolus. There is  prominence of the ascending thoracic aorta with a measured transverse diameter of 4.6 x 4.6 cm. No dissection is demonstrable. Note that the contrast bolus is not sufficient in the aorta to assess for potential dissection. There are foci of carotid artery calcification. Ascending thoracic aortic aneurysm. Recommend semi-annual imaging followup by CTA or MRA and referral to cardiothoracic surgery if not already obtained. This recommendation follows 2010 ACCF/AHA/AATS/ACR/ASA/SCA/SCAI/SIR/STS/SVM Guidelines for the Diagnosis and Management of Patients With Thoracic Aortic Disease. Circulation. 2010; 121: Q947-M546. Aortic aneurysm NOS (ICD10-I71.9).  2. Small calcified granuloma right base. Small free-flowing pleural effusion on the left with mild left base atelectasis. No consolidation.  3.  No evident thoracic adenopathy.  Aortic aneurysm NOS (ICD10-I71.9).   Electronically Signed   By: Lowella Grip III M.D.   On: 01/13/2019 14:29  Impression:  This 79 year old gentleman has a 4.6 cm fusiform ascending aortic aneurysm of unclear duration.  His echocardiogram shows a trileaflet aortic valve with mild central regurgitation.  This  aortic aneurysm is still well below the 5.5 cm surgical threshold.  I reviewed the CT images with the patient and his family and answered their questions.  I discussed the importance of good blood pressure control.  I advised him against lifting anything heavier than 35 pounds which may result in a Valsalva maneuver and sudden rise in his blood pressure to high levels.  I think that should be followed up with a repeat study in 1 year.  His echocardiogram also shows a mild reduction in his left ventricular systolic function with ejection fraction with diffuse hypokinesis.  There is moderate mitral regurgitation on echocardiogram although I do not hear a murmur.  He had a nuclear stress test in February 2019 which was an intermediate risk study with  ejection fraction reduced to 42% with diffuse hypokinesis and a fixed small mild apical lateral perfusion defect suggesting possible prior infarction.  Dense for significant ischemia.  He does have some calcified plaque in the left main as well as the proximal LAD and left circumflex coronary arteries on CT scan.  He is going to follow-up with Dr. Acie Fredrickson concerning his mitral regurgitation and congestive heart failure symptoms.  He also has a 2.3 cm right renal mass concerning for renal cell carcinoma which is being worked up by Dr. [name].  As far as the ascending aortic aneurysm goes I don't see any contraindication to undergoing any surgical procedure for this renal mass.  Plan:  I will plan to see him back in 1 year with a CTA of the chest to follow-up on his ascending aortic aneurysm.  I spent 45 minutes performing this consultation and > 50% of this time was spent face to face counseling and coordinating the care of this patient's ascending aortic aneurysm.   Gaye Pollack, MD [facility] [phone]

## 2019-02-09 DIAGNOSIS — C61 Malignant neoplasm of prostate: Secondary | ICD-10-CM | POA: Diagnosis not present

## 2019-02-09 DIAGNOSIS — I119 Hypertensive heart disease without heart failure: Secondary | ICD-10-CM | POA: Diagnosis not present

## 2019-02-09 DIAGNOSIS — R1310 Dysphagia, unspecified: Secondary | ICD-10-CM | POA: Diagnosis not present

## 2019-02-09 DIAGNOSIS — R030 Elevated blood-pressure reading, without diagnosis of hypertension: Secondary | ICD-10-CM | POA: Diagnosis not present

## 2019-02-09 DIAGNOSIS — Z7901 Long term (current) use of anticoagulants: Secondary | ICD-10-CM | POA: Diagnosis not present

## 2019-02-09 DIAGNOSIS — I4821 Permanent atrial fibrillation: Secondary | ICD-10-CM | POA: Diagnosis not present

## 2019-02-10 ENCOUNTER — Ambulatory Visit (INDEPENDENT_AMBULATORY_CARE_PROVIDER_SITE_OTHER): Payer: Medicare Other | Admitting: *Deleted

## 2019-02-10 DIAGNOSIS — I4821 Permanent atrial fibrillation: Secondary | ICD-10-CM

## 2019-02-10 DIAGNOSIS — Z7901 Long term (current) use of anticoagulants: Secondary | ICD-10-CM

## 2019-02-10 LAB — POCT INR: INR: 2.2 (ref 2.0–3.0)

## 2019-02-10 NOTE — Patient Instructions (Addendum)
Description   Continue taking your same dosage 1 tablet daily except 1.5 tablets on Sundays and Wednesdays. Recheck in 4 weeks. Call us with any update or changes [phone].

## 2019-02-23 ENCOUNTER — Encounter: Payer: Self-pay | Admitting: Cardiovascular Disease

## 2019-02-23 ENCOUNTER — Ambulatory Visit (INDEPENDENT_AMBULATORY_CARE_PROVIDER_SITE_OTHER): Payer: Medicare Other | Admitting: Cardiovascular Disease

## 2019-02-23 VITALS — BP 125/80 | HR 85 | Ht 71.0 in | Wt 186.8 lb

## 2019-02-23 DIAGNOSIS — I340 Nonrheumatic mitral (valve) insufficiency: Secondary | ICD-10-CM

## 2019-02-23 DIAGNOSIS — I712 Thoracic aortic aneurysm, without rupture: Secondary | ICD-10-CM

## 2019-02-23 DIAGNOSIS — I5022 Chronic systolic (congestive) heart failure: Secondary | ICD-10-CM | POA: Diagnosis not present

## 2019-02-23 DIAGNOSIS — I7121 Aneurysm of the ascending aorta, without rupture: Secondary | ICD-10-CM

## 2019-02-23 MED ORDER — CARVEDILOL 3.125 MG PO TABS: 3.1250 mg | ORAL_TABLET | Freq: Two times a day (BID) | ORAL | 11 refills | Status: DC

## 2019-02-23 MED ORDER — FUROSEMIDE 20 MG PO TABS: 20.0000 mg | ORAL_TABLET | Freq: Two times a day (BID) | ORAL | 3 refills | Status: DC

## 2019-02-23 NOTE — H&P (View-Only) (Signed)
Cardiology Office Note   Date:  02/23/2019   ID:  Daniel Love, DOB [birth year], MRN [id number]  PCP:  Chesley Noon, MD  Cardiologist: Darlin Coco MD / Raynee Mccasland   Chief Complaint  Patient presents with  . Congestive Heart Failure   Problem list 1. Chronic atrial fibrillation 2. Essential hypertension 3.   July 12, 2015 - notes from Daniel Love is a 79 y.o. male who presents for a one-year follow-up visit  This 79 year old gentleman is seen for a one-year followup office visit. He has a history of permanent atrial fibrillation. We initially saw him in December 2010 at which time he was in atrial fibrillation. He underwent cardioversion on 01/27/10 successfully. He was then lost to followup until 05/14/13 at which time he returned in atrial fibrillation. Over that period of time he continued to get his prothrombin time faithfully. He is asymptomatic in terms of his atrial fibrillation. Not aware of his heart rate. He does have occasional mild lightheaded spells which may be related to his bradycardia. He's had a past history of hypertension.  He denies any chest pain or shortness of breath.  Energy level is satisfactory.  He does not have any history of ischemic heart disease or TIA or stroke  July 20, 2016:  Daniel Love is seen today  Has chronic atrial fib with slow HR  No syncope or presyncope  Aug. 31, 2018  Has rare episodes of chest congestion  / bronchitis, Feels like he needs to cough.  Walks about a mile a day without any problems.   December 17, 2018: Seen with wife and  Daughter , Daniel Love   Seen today for follow-up of his atrial fibrillation.  He remains on Coumadin. Is been started on Lasix 20 mg a day and isosorbide 15 mg a day since I last saw him. He was having lots of scrotum .  Finished his prostate therapy   ( XRT, Lupron)  In August Has had lots of leg edema since that time   Daniel Love saw him and did a pelvic CT scan to  explore the edema Was found to have a 2.3 cm renal mass ( likely cancer )   Feb. 24, 2020:  Daniel Love is seen today with his wife and his daughter, Daniel Love. He has been found to have a 4 by 6 x 4 x 6 fusiform ascending aortic aneurysm.  There is no evidence of that dissection.  He has seen Dr. Cyndia Love .   The threshold to consider surgical repair of this ascending aortic aneurysm is 5.5 cm.  He has been advised not to lift anything heavier than 35 pounds.  BP is a bit elevated.   Does still eat some of these foods.   Echo on Jan. 2020 shows mildly reduced LV function .   Moderate MR .   His peripheral edema has improved significantly with the Lasix.     Has a renal mass  Past Medical History:  Diagnosis Date  . Anal fissure    hx of   . Atrial fibrillation (HCC)    maintaining normal sinus rhythm  . Chronic anticoagulation    coumadin  . Dyspepsia   . GERD (gastroesophageal reflux disease)   . Glaucoma   . History of colon polyps 2012   Colonoscopy-Dr. Benson Love   . History of echocardiogram    Echocardiogram 2/19: EF 60-65, normal wall motion, trivial AI, moderate to severe MR, mild LAE, mild RAE, mild to  moderate TR, PASP 35  . History of kidney stones   . History of nuclear stress test    Myoview 2/19: EF 42, diffuse HK no significant ischemia, apical lateral scar  . Hypertension   . Prostate cancer ([place])   . Skin cancer    skin cancer removed from left arm and back    Past Surgical History:  Procedure Laterality Date  . BACK SURGERY  12/31/1977   for degenerative disc disease  . COLONOSCOPY    . PROSTATE BIOPSY    . UPPER GASTROINTESTINAL ENDOSCOPY       Current Outpatient Medications  Medication Sig Dispense Refill  . colchicine 0.6 MG tablet Take 0.6 mg by mouth daily as needed (gout).     . furosemide (LASIX) 20 MG tablet Take 20 mg by mouth 2 (two) times daily.    . isosorbide mononitrate (IMDUR) 30 MG 24 hr tablet Take 1 tablet by mouth daily.    Daniel Love LUMIGAN 0.01 %  SOLN Place 1 drop into both eyes daily.    . Methylcellulose, Laxative, (CITRUCEL PO) Take by mouth as needed (for constipation).     . Multiple Vitamins-Minerals (CENTRUM SILVER PO) Take 1 tablet by mouth every morning.      . nystatin cream (MYCOSTATIN) Apply 1 application topically as needed (RASH).     Daniel Love olmesartan-hydrochlorothiazide (BENICAR HCT) 20-12.5 MG tablet Take 1 tablet by mouth daily. 90 tablet 3  . omeprazole (PRILOSEC) 40 MG capsule Take 40 mg by mouth daily as needed.     . warfarin (COUMADIN) 5 MG tablet TAKE AS DIRECTED BY COUMADIN CLINIC 120 tablet 0   Current Facility-Administered Medications  Medication Dose Route Frequency Provider Last Rate Last Dose  . 0.9 %  sodium chloride infusion  500 mL Intravenous Continuous Milus Banister, MD        Allergies:   Amlodipine and Lisinopril    Social History:  The patient  reports that he quit smoking about 52 years ago. His smoking use included cigarettes. He has a 7.00 pack-year smoking history. He has never used smokeless tobacco. He reports current alcohol use of about 7.0 standard drinks of alcohol per week. He reports that he does not use drugs.   Family History:  The patient's family history includes Cancer in his father and mother; Colon cancer in his maternal grandmother; Heart attack in his father; Heart failure in his father.    ROS:  Please see the history of present illness.   Otherwise, review of systems are positive for none.   All other systems are reviewed and negative.    Physical Exam: Blood pressure (!) 152/84, pulse 85, height 5\' 11"  (1.803 m), weight 186 lb 12.8 oz (84.7 kg), SpO2 97 %.  GEN:    HEENT: Normal NECK: No JVD; No carotid bruits LYMPHATICS: No lymphadenopathy CARDIAC:  Irreg. Irreg.  RESPIRATORY:  Clear to auscultation without rales, wheezing or rhonchi  ABDOMEN: Soft, non-tender, non-distended MUSCULOSKELETAL:  No edema; No deformity  SKIN: Warm and dry NEUROLOGIC:  Alert and  oriented x 3    EKG:    Recent Labs: 01/02/2019: BUN 22; Creatinine, Ser 1.06; Potassium 3.6; Sodium 141    Lipid Panel No results found for: CHOL, TRIG, HDL, CHOLHDL, VLDL, LDLCALC, LDLDIRECT    Wt Readings from Last 3 Encounters:  02/23/19 186 lb 12.8 oz (84.7 kg)  02/04/19 184 lb 12.8 oz (83.8 kg)  12/17/18 189 lb 12.8 oz (86.1 kg)  ASSESSMENT AND PLAN:  1. Atrial fibrillation:       Stable.    2.   Chronic diastolic CHF:    Mod - severe MR  Ejection fractions 45%.  3.  Mitral regurgitation.   - will get a TEE in the next several weeks to assess his MV .  He is found to have severe mitral regurgitation then will need to refer him to the valve team to consider his options.  2. Essential HTN:    BP is mildly elevated.   Will start Coreg 3.125 BID  ( to help protect from rupture of his thoracic aortic aneurism )    Mertie Moores, MD  02/23/2019 2:57 PM    [address],  [place], [place]  [address] Pager [phone] Phone: [phone]; Fax: [phone]

## 2019-02-23 NOTE — Patient Instructions (Addendum)
Medication Instructions:  Your physician has recommended you make the following change in your medication:  START Carvedilol (Coreg) 3.125 mg twice daily  If you need a refill on your cardiac medications before your next appointment, please call your pharmacy.   Lab work: TODAY - CBC, BMET  If you have labs (blood work) drawn today and your tests are completely normal, you will receive your results only by: [name] MyChart Message (if you have MyChart) OR . A paper copy in the mail If you have any lab test that is abnormal or we need to change your treatment, we will call you to review the results.  Testing/Procedures: You are scheduled for a TEE/Cardioversion/TEE Cardioversion on Wednesday March 4 with Dr. Acie Fredrickson.  Please arrive at the [facility] (Main Entrance A) at [facility]: [address] [place], [place] [address] at  9:00 am. (1 hour prior to procedure unless lab work is needed; if lab work is needed arrive 1.5 hours ahead)  DIET: Nothing to eat or drink after midnight except a sip of water with medications (see medication instructions below)  Medication Instructions: Hold Lasix (Furosemide) on the morning of the procedure  Continue your anticoagulant: Coumadin You will need to continue your anticoagulant after your procedure until you  are told by your  Provider that it is safe to stop    You must have a responsible person to drive you home and stay in the waiting area during your procedure. Failure to do so could result in cancellation.  Bring your insurance cards.  *Special Note: Every effort is made to have your procedure done on time. Occasionally there are emergencies that occur at the hospital that may cause delays. Please be patient if a delay does occur.     Follow-Up: At [facility], you and your health needs are our priority.  As part of our continuing mission to provide you with exceptional heart care, we have created designated Provider Care Teams.   These Care Teams include your primary Cardiologist (physician) and Advanced Practice Providers (APPs -  Physician Assistants and Nurse Practitioners) who all work together to provide you with the care you need, when you need it. You will need a follow up appointment in:  6 months.  Please call our office 2 months in advance to schedule this appointment.  You may see Mertie Moores, MD or one of the following Advanced Practice Providers on your designated Care Team: Richardson Dopp, PA-C [place], [place] . Daune Perch, NP

## 2019-02-23 NOTE — Progress Notes (Signed)
Cardiology Office Note   Date:  02/23/2019   ID:  Daniel Love, DOB June 26, 1940, MRN [id number]  PCP:  Chesley Noon, MD  Cardiologist: Darlin Coco MD / Nahser   Chief Complaint  Patient presents with  . Congestive Heart Failure   Problem list 1. Chronic atrial fibrillation 2. Essential hypertension 3.   July 12, 2015 - notes from Jaqwon Manfred is a 79 y.o. male who presents for a one-year follow-up visit  This 79 year old gentleman is seen for a one-year followup office visit. He has a history of permanent atrial fibrillation. We initially saw him in December 2010 at which time he was in atrial fibrillation. He underwent cardioversion on 01/27/10 successfully. He was then lost to followup until 05/14/13 at which time he returned in atrial fibrillation. Over that period of time he continued to get his prothrombin time faithfully. He is asymptomatic in terms of his atrial fibrillation. Not aware of his heart rate. He does have occasional mild lightheaded spells which may be related to his bradycardia. He's had a past history of hypertension.  He denies any chest pain or shortness of breath.  Energy level is satisfactory.  He does not have any history of ischemic heart disease or TIA or stroke  July 20, 2016:  Daniel Love is seen today  Has chronic atrial fib with slow HR  No syncope or presyncope  Aug. 31, 2018  Has rare episodes of chest congestion  / bronchitis, Feels like he needs to cough.  Walks about a mile a day without any problems.   December 17, 2018: Seen with wife and  Daughter , [name]   Seen today for follow-up of his atrial fibrillation.  He remains on Coumadin. Is been started on Lasix 20 mg a day and isosorbide 15 mg a day since I last saw him. He was having lots of scrotum .  Finished his prostate therapy   ( XRT, Lupron)  In August Has had lots of leg edema since that time   Oluwafemi Villella saw him and did a pelvic CT scan to  explore the edema Was found to have a 2.3 cm renal mass ( likely cancer )   Feb. 24, 2020:  Daniel Love is seen today with his wife and his daughter, [name]. He has been found to have a 4 by 6 x 4 x 6 fusiform ascending aortic aneurysm.  There is no evidence of that dissection.  He has seen [name] .   The threshold to consider surgical repair of this ascending aortic aneurysm is 5.5 cm.  He has been advised not to lift anything heavier than 35 pounds.  BP is a bit elevated.   Does still eat some of these foods.   Echo on Jan. 2020 shows mildly reduced LV function .   Moderate MR .   His peripheral edema has improved significantly with the Lasix.     Has a renal mass  Past Medical History:  Diagnosis Date  . Anal fissure    hx of   . Atrial fibrillation (HCC)    maintaining normal sinus rhythm  . Chronic anticoagulation    coumadin  . Dyspepsia   . GERD (gastroesophageal reflux disease)   . Glaucoma   . History of colon polyps 2012   Colonoscopy-Dr. Benson [place]   . History of echocardiogram    Echocardiogram 2/19: EF 60-65, normal wall motion, trivial AI, moderate to severe MR, mild LAE, mild RAE, mild to  moderate TR, PASP 35  . History of kidney stones   . History of nuclear stress test    Myoview 2/19: EF 42, diffuse HK no significant ischemia, apical lateral scar  . Hypertension   . Prostate cancer ([place])   . Skin cancer    skin cancer removed from left arm and back    Past Surgical History:  Procedure Laterality Date  . BACK SURGERY  12/31/1977   for degenerative disc disease  . COLONOSCOPY    . PROSTATE BIOPSY    . UPPER GASTROINTESTINAL ENDOSCOPY       Current Outpatient Medications  Medication Sig Dispense Refill  . colchicine 0.6 MG tablet Take 0.6 mg by mouth daily as needed (gout).     . furosemide (LASIX) 20 MG tablet Take 20 mg by mouth 2 (two) times daily.    . isosorbide mononitrate (IMDUR) 30 MG 24 hr tablet Take 1 tablet by mouth daily.    [name] LUMIGAN 0.01 %  SOLN Place 1 drop into both eyes daily.    . Methylcellulose, Laxative, (CITRUCEL PO) Take by mouth as needed (for constipation).     . Multiple Vitamins-Minerals (CENTRUM SILVER PO) Take 1 tablet by mouth every morning.      . nystatin cream (MYCOSTATIN) Apply 1 application topically as needed (RASH).     [name] olmesartan-hydrochlorothiazide (BENICAR HCT) 20-12.5 MG tablet Take 1 tablet by mouth daily. 90 tablet 3  . omeprazole (PRILOSEC) 40 MG capsule Take 40 mg by mouth daily as needed.     . warfarin (COUMADIN) 5 MG tablet TAKE AS DIRECTED BY COUMADIN CLINIC 120 tablet 0   Current Facility-Administered Medications  Medication Dose Route Frequency Provider Last Rate Last Dose  . 0.9 %  sodium chloride infusion  500 mL Intravenous Continuous Milus Banister, MD        Allergies:   Amlodipine and Lisinopril    Social History:  The patient  reports that he quit smoking about 52 years ago. His smoking use included cigarettes. He has a 7.00 pack-year smoking history. He has never used smokeless tobacco. He reports current alcohol use of about 7.0 standard drinks of alcohol per week. He reports that he does not use drugs.   Family History:  The patient's family history includes Cancer in his father and mother; Colon cancer in his maternal grandmother; Heart attack in his father; Heart failure in his father.    ROS:  Please see the history of present illness.   Otherwise, review of systems are positive for none.   All other systems are reviewed and negative.    Physical Exam: Blood pressure (!) 152/84, pulse 85, height 5\' 11"  (1.803 m), weight 186 lb 12.8 oz (84.7 kg), SpO2 97 %.  GEN:    HEENT: Normal NECK: No JVD; No carotid bruits LYMPHATICS: No lymphadenopathy CARDIAC:  Irreg. Irreg.  RESPIRATORY:  Clear to auscultation without rales, wheezing or rhonchi  ABDOMEN: Soft, non-tender, non-distended MUSCULOSKELETAL:  No edema; No deformity  SKIN: Warm and dry NEUROLOGIC:  Alert and  oriented x 3    EKG:    Recent Labs: 01/02/2019: BUN 22; Creatinine, Ser 1.06; Potassium 3.6; Sodium 141    Lipid Panel No results found for: CHOL, TRIG, HDL, CHOLHDL, VLDL, LDLCALC, LDLDIRECT    Wt Readings from Last 3 Encounters:  02/23/19 186 lb 12.8 oz (84.7 kg)  02/04/19 184 lb 12.8 oz (83.8 kg)  12/17/18 189 lb 12.8 oz (86.1 kg)  ASSESSMENT AND PLAN:  1. Atrial fibrillation:       Stable.    2.   Chronic diastolic CHF:    Mod - severe MR  Ejection fractions 45%.  3.  Mitral regurgitation.   - will get a TEE in the next several weeks to assess his MV .  He is found to have severe mitral regurgitation then will need to refer him to the valve team to consider his options.  2. Essential HTN:    BP is mildly elevated.   Will start Coreg 3.125 BID  ( to help protect from rupture of his thoracic aortic aneurism )    Mertie Moores, MD  02/23/2019 2:57 PM    [place],  [place], [place]  [address] Pager [phone] Phone: [phone]; Fax: [phone]

## 2019-02-24 LAB — CBC
Hematocrit: 42.4 % (ref 37.5–51.0)
Hemoglobin: 13.7 g/dL (ref 13.0–17.7)
MCH: 29.6 pg (ref 26.6–33.0)
MCHC: 32.3 g/dL (ref 31.5–35.7)
MCV: 92 fL (ref 79–97)
Platelets: 138 10*3/uL — ABNORMAL LOW (ref 150–450)
RBC: 4.63 x10E6/uL (ref 4.14–5.80)
RDW: 14.2 % (ref 11.6–15.4)
WBC: 4.8 10*3/uL (ref 3.4–10.8)

## 2019-02-24 LAB — BASIC METABOLIC PANEL
BUN / CREAT RATIO: 21 (ref 10–24)
BUN: 23 mg/dL (ref 8–27)
CO2: 24 mmol/L (ref 20–29)
Calcium: 9.3 mg/dL (ref 8.6–10.2)
Chloride: 105 mmol/L (ref 96–106)
Creatinine, Ser: 1.12 mg/dL (ref 0.76–1.27)
GFR calc Af Amer: 72 mL/min/{1.73_m2} (ref >59)
GFR calc non Af Amer: 63 mL/min/{1.73_m2} (ref >59)
Glucose: 68 mg/dL (ref 65–99)
Potassium: 4.1 mmol/L (ref 3.5–5.2)
Sodium: 144 mmol/L (ref 134–144)

## 2019-03-04 ENCOUNTER — Ambulatory Visit (HOSPITAL_BASED_OUTPATIENT_CLINIC_OR_DEPARTMENT_OTHER)
Admission: RE | Admit: 2019-03-04 | Discharge: 2019-03-04 | Disposition: A | Discharge status: Home / Self Care | Payer: Medicare Other | Source: Home / Self Care | Attending: Cardiology | Admitting: Cardiology

## 2019-03-04 ENCOUNTER — Other Ambulatory Visit: Payer: Self-pay

## 2019-03-04 ENCOUNTER — Encounter (HOSPITAL_COMMUNITY): Payer: Self-pay | Admitting: *Deleted

## 2019-03-04 ENCOUNTER — Ambulatory Visit (HOSPITAL_COMMUNITY)
Admission: RE | Admit: 2019-03-04 | Discharge: 2019-03-04 | Disposition: A | Discharge status: Home / Self Care | Payer: Medicare Other | Attending: Cardiovascular Disease | Admitting: Cardiovascular Disease

## 2019-03-04 ENCOUNTER — Encounter (HOSPITAL_COMMUNITY)
Admission: RE | Disposition: A | Discharge status: Home / Self Care | Payer: Self-pay | Source: Home / Self Care | Attending: Cardiovascular Disease

## 2019-03-04 DIAGNOSIS — I110 Hypertensive heart disease with heart failure: Secondary | ICD-10-CM | POA: Diagnosis not present

## 2019-03-04 DIAGNOSIS — I4821 Permanent atrial fibrillation: Secondary | ICD-10-CM | POA: Diagnosis not present

## 2019-03-04 DIAGNOSIS — Z8546 Personal history of malignant neoplasm of prostate: Secondary | ICD-10-CM | POA: Insufficient documentation

## 2019-03-04 DIAGNOSIS — Z79899 Other long term (current) drug therapy: Secondary | ICD-10-CM | POA: Insufficient documentation

## 2019-03-04 DIAGNOSIS — K219 Gastro-esophageal reflux disease without esophagitis: Secondary | ICD-10-CM | POA: Insufficient documentation

## 2019-03-04 DIAGNOSIS — Z87891 Personal history of nicotine dependence: Secondary | ICD-10-CM | POA: Insufficient documentation

## 2019-03-04 DIAGNOSIS — I272 Pulmonary hypertension, unspecified: Secondary | ICD-10-CM | POA: Insufficient documentation

## 2019-03-04 DIAGNOSIS — Z85828 Personal history of other malignant neoplasm of skin: Secondary | ICD-10-CM | POA: Insufficient documentation

## 2019-03-04 DIAGNOSIS — H409 Unspecified glaucoma: Secondary | ICD-10-CM | POA: Diagnosis not present

## 2019-03-04 DIAGNOSIS — Z888 Allergy status to other drugs, medicaments and biological substances status: Secondary | ICD-10-CM | POA: Insufficient documentation

## 2019-03-04 DIAGNOSIS — I5032 Chronic diastolic (congestive) heart failure: Secondary | ICD-10-CM | POA: Diagnosis not present

## 2019-03-04 DIAGNOSIS — Z7901 Long term (current) use of anticoagulants: Secondary | ICD-10-CM | POA: Insufficient documentation

## 2019-03-04 DIAGNOSIS — I340 Nonrheumatic mitral (valve) insufficiency: Secondary | ICD-10-CM

## 2019-03-04 HISTORY — PX: TEE WITHOUT CARDIOVERSION: SHX5443

## 2019-03-04 SURGERY — ECHOCARDIOGRAM, TRANSESOPHAGEAL
Anesthesia: Moderate Sedation

## 2019-03-04 MED ORDER — BUTAMBEN-TETRACAINE-BENZOCAINE 2-2-14 % EX AERO
INHALATION_SPRAY | CUTANEOUS | Status: DC | PRN
  Administered 2019-03-04: 2 via TOPICAL

## 2019-03-04 MED ORDER — FENTANYL CITRATE (PF) 100 MCG/2ML IJ SOLN
INTRAMUSCULAR | Status: AC
  Filled 2019-03-04: qty 2

## 2019-03-04 MED ORDER — FENTANYL CITRATE (PF) 100 MCG/2ML IJ SOLN
INTRAMUSCULAR | Status: DC | PRN
  Administered 2019-03-04: 25 ug via INTRAVENOUS

## 2019-03-04 MED ORDER — MIDAZOLAM HCL (PF) 10 MG/2ML IJ SOLN
INTRAMUSCULAR | Status: DC | PRN
  Administered 2019-03-04: 2 mg via INTRAVENOUS

## 2019-03-04 MED ORDER — MIDAZOLAM HCL (PF) 5 MG/ML IJ SOLN
INTRAMUSCULAR | Status: AC
  Filled 2019-03-04: qty 2

## 2019-03-04 NOTE — Discharge Instructions (Signed)

## 2019-03-04 NOTE — CV Procedure (Signed)
   Transesophageal Echocardiogram  Indications: Evaluate mitral regurgitation  Time out performed  During this procedure the patient is administered a total of Versed 2mg  and Fentanyl 25 mcg to achieve and maintain moderate conscious sedation.  The patient's heart rate, blood pressure, and oxygen saturation are monitored continuously during the procedure. The period of conscious sedation is 15 minutes, of which I was present face-to-face 100% of this time.  Findings:  Left Ventricle: Ejection fraction approximately 45% moderately reduced  Mitral Valve: Thickened, degenerative mitral leaflets with posterior mild prolapse.  Pisa radius 0.5 cm.  No pulmonary vein flow reversal.  Overall moderate mitral regurgitation.  Does not appear severe.  Aortic Valve: Mildly calcified and thickened leaflets with mild aortic regurgitation  Tricuspid Valve: Thickened tricuspid leaflets with moderate to severe tricuspid regurgitation, mild elevated pulmonary pressures  Left Atrium: Severely dilated left atrium, 6.2 cm diameter   Candee Furbish, MD

## 2019-03-04 NOTE — Progress Notes (Signed)
  Echocardiogram Echocardiogram Transesophageal has been performed.  Daniel Love 03/04/2019, 10:53 AM

## 2019-03-04 NOTE — Interval H&P Note (Signed)
History and Physical Interval Note:  03/04/2019 9:47 AM  Daniel Love  has presented today for surgery, with the diagnosis of mitral regurg  The various methods of treatment have been discussed with the patient and family. After consideration of risks, benefits and other options for treatment, the patient has consented to  Procedure(s): TRANSESOPHAGEAL ECHOCARDIOGRAM (TEE) (N/A) as a surgical intervention .  The patient's history has been reviewed, patient examined, no change in status, stable for surgery.  I have reviewed the patient's chart and labs.  Questions were answered to the patient's satisfaction.     [employer]

## 2019-03-05 ENCOUNTER — Other Ambulatory Visit: Payer: Self-pay | Admitting: Cardiovascular Disease

## 2019-03-05 ENCOUNTER — Telehealth: Payer: Self-pay | Admitting: Nurse Practitioner

## 2019-03-05 NOTE — Telephone Encounter (Signed)
Called patient and scheduled a BP check on 3/10 when the patient comes in for CVRR appointment. He verbalized understanding and agreement with plan.

## 2019-03-05 NOTE — Telephone Encounter (Signed)
-----   Message from Jerline Pain, MD sent at 03/04/2019 10:51 AM EST ----- Moderate mitral regurgitation.  Mild AI, EF 40 to 45%.  Blood pressure still elevated on low-dose carvedilol 3.125 twice a day, consider increasing.  Discussed with Dr. Acie Fredrickson. Candee Furbish, MD

## 2019-03-06 ENCOUNTER — Encounter (HOSPITAL_COMMUNITY): Payer: Self-pay | Admitting: Cardiovascular Disease

## 2019-03-06 DIAGNOSIS — H9201 Otalgia, right ear: Secondary | ICD-10-CM | POA: Diagnosis not present

## 2019-03-09 DIAGNOSIS — C61 Malignant neoplasm of prostate: Secondary | ICD-10-CM | POA: Diagnosis not present

## 2019-03-10 ENCOUNTER — Other Ambulatory Visit: Payer: Medicare Other | Admitting: Nurse Practitioner

## 2019-03-10 ENCOUNTER — Ambulatory Visit
Admission: RE | Admit: 2019-03-10 | Discharge: 2019-03-10 | Disposition: A | Discharge status: Home / Self Care | Payer: Medicare Other | Source: Ambulatory Visit | Attending: Urology | Admitting: Urology

## 2019-03-10 ENCOUNTER — Ambulatory Visit (INDEPENDENT_AMBULATORY_CARE_PROVIDER_SITE_OTHER): Payer: Medicare Other | Admitting: Pharmacist

## 2019-03-10 VITALS — BP 146/80 | HR 64 | Resp 16 | Ht 71.0 in

## 2019-03-10 DIAGNOSIS — I4821 Permanent atrial fibrillation: Secondary | ICD-10-CM

## 2019-03-10 DIAGNOSIS — Z7901 Long term (current) use of anticoagulants: Secondary | ICD-10-CM

## 2019-03-10 DIAGNOSIS — I340 Nonrheumatic mitral (valve) insufficiency: Secondary | ICD-10-CM

## 2019-03-10 DIAGNOSIS — D3001 Benign neoplasm of right kidney: Secondary | ICD-10-CM

## 2019-03-10 DIAGNOSIS — N289 Disorder of kidney and ureter, unspecified: Secondary | ICD-10-CM | POA: Diagnosis not present

## 2019-03-10 DIAGNOSIS — N281 Cyst of kidney, acquired: Secondary | ICD-10-CM | POA: Diagnosis not present

## 2019-03-10 DIAGNOSIS — I10 Essential (primary) hypertension: Secondary | ICD-10-CM

## 2019-03-10 LAB — POCT INR: INR: 2.2 (ref 2.0–3.0)

## 2019-03-10 MED ORDER — GADOBENATE DIMEGLUMINE 529 MG/ML IV SOLN
17.0000 mL | Freq: Once | INTRAVENOUS | Status: AC | PRN
  Administered 2019-03-10: 17 mL via INTRAVENOUS

## 2019-03-10 MED ORDER — OLMESARTAN MEDOXOMIL-HCTZ 40-25 MG PO TABS: 1.0000 | ORAL_TABLET | Freq: Every day | ORAL | 11 refills | Status: DC

## 2019-03-10 NOTE — Progress Notes (Unsigned)
1.) Reason for visit: BP check  2.) Name of MD requesting visit: Dr. Drue Flirt. Nahser  3.) H&P: Patient had TEE on 3/4 for evaluation of his mitral valve and BP was elevated. Dr. Acie Fredrickson had recently started patient on  carvedilol 3.125 mg BID on 2/24  4.) ROS related to problem: Patient denies complaints. States he checks BP at home and it remains elevated. He states he occasionally has dizziness but he thinks this is a s/e of multiple medications.   5.) Assessment and plan per MD: Per Dr. Acie Fredrickson, will increase olmesartan/hct to 40-25 mg daily. Patient was advised to take 2 of his current pills (olmesartan/hct 20-12.5 mg) and call in one week to report how he is doing. He is scheduled for f/u on 4/13 with Dr. Acie Fredrickson

## 2019-03-10 NOTE — Patient Instructions (Signed)
Description   Continue taking your same dosage 1 tablet daily except 1.5 tablets on Sundays and Wednesdays. Recheck in 6 weeks. Call us with any update or changes [phone].

## 2019-03-10 NOTE — Patient Instructions (Addendum)
Medication Instructions:  Your physician has recommended you make the following change in your medication:  INCREASE Benicar (Olmesartan/HCTZ) to 40-25 mg once daily (Take 2 of your current Benicar pills)  **CALL in one week to report how you are feeling  If you need a refill on your cardiac medications before your next appointment, please call your pharmacy.    Lab work: None Ordered   Testing/Procedures: None    Follow-Up: Your physician recommends that you return for a follow-up appointment on Monday April 13 at 1:40 pm with Dr. Acie Fredrickson

## 2019-04-07 ENCOUNTER — Telehealth: Payer: Self-pay | Admitting: Nurse Practitioner

## 2019-04-07 NOTE — Telephone Encounter (Signed)
Called patient to discuss upcoming appointment with Dr. Acie Fredrickson next week. Patient states he feels the best he has felt in a year. Reports BP readings are all "within acceptable range", he has a good amount of energy, is sleeping well, and his weight is stable. He would like to postpone his visit to the fall and will call back with questions or concerns. He thanked me for calling him.

## 2019-04-13 ENCOUNTER — Ambulatory Visit: Payer: Medicare Other | Admitting: Cardiovascular Disease

## 2019-04-20 ENCOUNTER — Telehealth: Payer: Self-pay

## 2019-04-20 DIAGNOSIS — C61 Malignant neoplasm of prostate: Secondary | ICD-10-CM | POA: Diagnosis not present

## 2019-04-20 NOTE — Telephone Encounter (Signed)

## 2019-04-21 ENCOUNTER — Other Ambulatory Visit: Payer: Self-pay

## 2019-04-21 ENCOUNTER — Ambulatory Visit (INDEPENDENT_AMBULATORY_CARE_PROVIDER_SITE_OTHER): Payer: Medicare Other | Admitting: Pharmacist Clinician (PhC)/ Clinical Pharmacy Specialist

## 2019-04-21 DIAGNOSIS — I4821 Permanent atrial fibrillation: Secondary | ICD-10-CM

## 2019-04-21 DIAGNOSIS — Z7901 Long term (current) use of anticoagulants: Secondary | ICD-10-CM | POA: Diagnosis not present

## 2019-04-21 LAB — POCT INR: INR: 2.5 (ref 2.0–3.0)

## 2019-04-30 DIAGNOSIS — C61 Malignant neoplasm of prostate: Secondary | ICD-10-CM | POA: Diagnosis not present

## 2019-04-30 DIAGNOSIS — Z5111 Encounter for antineoplastic chemotherapy: Secondary | ICD-10-CM | POA: Diagnosis not present

## 2019-04-30 DIAGNOSIS — D3000 Benign neoplasm of unspecified kidney: Secondary | ICD-10-CM | POA: Diagnosis not present

## 2019-05-11 DIAGNOSIS — Z7901 Long term (current) use of anticoagulants: Secondary | ICD-10-CM | POA: Diagnosis not present

## 2019-05-11 DIAGNOSIS — I10 Essential (primary) hypertension: Secondary | ICD-10-CM | POA: Diagnosis not present

## 2019-05-11 DIAGNOSIS — I4821 Permanent atrial fibrillation: Secondary | ICD-10-CM | POA: Diagnosis not present

## 2019-05-11 DIAGNOSIS — K219 Gastro-esophageal reflux disease without esophagitis: Secondary | ICD-10-CM | POA: Diagnosis not present

## 2019-05-11 DIAGNOSIS — I119 Hypertensive heart disease without heart failure: Secondary | ICD-10-CM | POA: Diagnosis not present

## 2019-05-11 DIAGNOSIS — C61 Malignant neoplasm of prostate: Secondary | ICD-10-CM | POA: Diagnosis not present

## 2019-06-10 ENCOUNTER — Telehealth: Payer: Self-pay

## 2019-06-10 NOTE — Telephone Encounter (Signed)
Unable to lmom for prescreen  

## 2019-06-16 ENCOUNTER — Other Ambulatory Visit: Payer: Self-pay

## 2019-06-16 ENCOUNTER — Ambulatory Visit (INDEPENDENT_AMBULATORY_CARE_PROVIDER_SITE_OTHER): Payer: Medicare Other | Admitting: *Deleted

## 2019-06-16 DIAGNOSIS — Z7901 Long term (current) use of anticoagulants: Secondary | ICD-10-CM | POA: Diagnosis not present

## 2019-06-16 DIAGNOSIS — I4821 Permanent atrial fibrillation: Secondary | ICD-10-CM | POA: Diagnosis not present

## 2019-06-16 LAB — POCT INR: INR: 1.9 — AB (ref 2.0–3.0)

## 2019-06-16 NOTE — Patient Instructions (Signed)
Description   Today take an extra 1/2 tablet then continue taking your same dosage 1 tablet daily except 1.5 tablets on Sundays and Wednesdays. Recheck in 6 weeks. Call us with any update or changes [phone].

## 2019-07-20 DIAGNOSIS — D692 Other nonthrombocytopenic purpura: Secondary | ICD-10-CM | POA: Diagnosis not present

## 2019-07-20 DIAGNOSIS — C4441 Basal cell carcinoma of skin of scalp and neck: Secondary | ICD-10-CM | POA: Diagnosis not present

## 2019-07-20 DIAGNOSIS — Z85828 Personal history of other malignant neoplasm of skin: Secondary | ICD-10-CM | POA: Diagnosis not present

## 2019-07-20 DIAGNOSIS — L812 Freckles: Secondary | ICD-10-CM | POA: Diagnosis not present

## 2019-07-20 DIAGNOSIS — D1801 Hemangioma of skin and subcutaneous tissue: Secondary | ICD-10-CM | POA: Diagnosis not present

## 2019-07-20 DIAGNOSIS — L570 Actinic keratosis: Secondary | ICD-10-CM | POA: Diagnosis not present

## 2019-07-20 DIAGNOSIS — L821 Other seborrheic keratosis: Secondary | ICD-10-CM | POA: Diagnosis not present

## 2019-07-20 DIAGNOSIS — D485 Neoplasm of uncertain behavior of skin: Secondary | ICD-10-CM | POA: Diagnosis not present

## 2019-07-23 ENCOUNTER — Telehealth: Payer: Self-pay

## 2019-07-23 NOTE — Telephone Encounter (Signed)
Spoke with pt regarding covid-19 screening prior to appt. Pt stated he has not been in contact with anyone who may have covid-19 and has no symptoms. 

## 2019-07-28 ENCOUNTER — Ambulatory Visit (INDEPENDENT_AMBULATORY_CARE_PROVIDER_SITE_OTHER): Payer: Medicare Other | Admitting: *Deleted

## 2019-07-28 ENCOUNTER — Other Ambulatory Visit: Payer: Self-pay

## 2019-07-28 DIAGNOSIS — Z7901 Long term (current) use of anticoagulants: Secondary | ICD-10-CM

## 2019-07-28 DIAGNOSIS — I4821 Permanent atrial fibrillation: Secondary | ICD-10-CM | POA: Diagnosis not present

## 2019-07-28 LAB — POCT INR: INR: 1.9 — AB (ref 2.0–3.0)

## 2019-07-28 NOTE — Patient Instructions (Signed)
Description   Take a extra 1/2 tablet today, then change dose to 1 tablet daily except for 1.5 tablets on Sunday, Wednesday and Friday. Recheck in 3 weeks. Call us with any update or changes [phone].

## 2019-08-18 ENCOUNTER — Other Ambulatory Visit: Payer: Self-pay

## 2019-08-18 ENCOUNTER — Ambulatory Visit (INDEPENDENT_AMBULATORY_CARE_PROVIDER_SITE_OTHER): Payer: Medicare Other | Admitting: *Deleted

## 2019-08-18 DIAGNOSIS — I4821 Permanent atrial fibrillation: Secondary | ICD-10-CM | POA: Diagnosis not present

## 2019-08-18 DIAGNOSIS — Z7901 Long term (current) use of anticoagulants: Secondary | ICD-10-CM

## 2019-08-18 LAB — POCT INR: INR: 2.7 (ref 2.0–3.0)

## 2019-08-18 NOTE — Patient Instructions (Addendum)
Description   Continue taking 1 tablet daily except for 1.5 tablets on Sunday, Wednesday and Friday. Recheck in 4 weeks. Call us with any update or changes [phone].

## 2019-08-24 ENCOUNTER — Telehealth: Payer: Self-pay | Admitting: Cardiovascular Disease

## 2019-08-24 NOTE — Telephone Encounter (Signed)
New message   Pt c/o medication issue:  1. Name of Medication: furosemide (LASIX) 20 MG tablet  2. How are you currently taking this medication (dosage and times per day)? Twice daily  3. Are you having a reaction (difficulty breathing--STAT)? no  4. What is your medication issue? Patient states he is currently taking 2 pills daily. Patient wants to confirm instructions

## 2019-08-24 NOTE — Telephone Encounter (Signed)
Left detailed message on patient's voice mail that our record indicates he is taking furosemide 20 mg once daily. I asked him to call back and ask for me.

## 2019-09-15 ENCOUNTER — Ambulatory Visit (INDEPENDENT_AMBULATORY_CARE_PROVIDER_SITE_OTHER): Payer: Medicare Other | Admitting: *Deleted

## 2019-09-15 ENCOUNTER — Other Ambulatory Visit: Payer: Self-pay

## 2019-09-15 DIAGNOSIS — I4821 Permanent atrial fibrillation: Secondary | ICD-10-CM

## 2019-09-15 DIAGNOSIS — Z7901 Long term (current) use of anticoagulants: Secondary | ICD-10-CM

## 2019-09-15 LAB — POCT INR: INR: 2.2 (ref 2.0–3.0)

## 2019-09-15 NOTE — Patient Instructions (Signed)
Description   Continue taking 1 tablet daily except for 1.5 tablets on Sunday, Wednesday and Friday. Recheck in 5 weeks. Call us with any update or changes [phone].

## 2019-09-29 ENCOUNTER — Encounter: Payer: Self-pay | Admitting: Cardiovascular Disease

## 2019-09-29 ENCOUNTER — Ambulatory Visit (INDEPENDENT_AMBULATORY_CARE_PROVIDER_SITE_OTHER): Payer: Medicare Other | Admitting: Cardiovascular Disease

## 2019-09-29 ENCOUNTER — Other Ambulatory Visit: Payer: Self-pay

## 2019-09-29 VITALS — BP 120/80 | HR 61 | Ht 71.0 in | Wt 198.2 lb

## 2019-09-29 DIAGNOSIS — I4821 Permanent atrial fibrillation: Secondary | ICD-10-CM

## 2019-09-29 DIAGNOSIS — Z23 Encounter for immunization: Secondary | ICD-10-CM | POA: Diagnosis not present

## 2019-09-29 NOTE — Patient Instructions (Signed)
Medication Instructions:  Your provider recommends that you continue on your current medications as directed. Please refer to the Current Medication list given to you today.    Labwork: None  Testing/Procedures: None  Follow-Up: Your provider wants you to follow-up in: 6 months with Dr. Acie Fredrickson. You will receive a reminder letter in the mail two months in advance. If you don't receive a letter, please call our office to schedule the follow-up appointment.    Any Other Special Instructions Will Be Listed Below (If Applicable).     If you need a refill on your cardiac medications before your next appointment, please call your pharmacy.

## 2019-09-29 NOTE — Progress Notes (Signed)
Cardiology Office Note   Date:  09/29/2019   ID:  Daniel Love, DOB [birth year], MRN [id number]  PCP:  Chesley Noon, MD  Cardiologist: Darlin Coco MD / Nahser   Chief Complaint  Patient presents with  . Atrial Fibrillation   Problem list 1. Chronic atrial fibrillation 2. Essential hypertension 3.   July 12, 2015 - notes from [name] is a 79 y.o. male who presents for a one-year follow-up visit  This 79 year old gentleman is seen for a one-year followup office visit. He has a history of permanent atrial fibrillation. We initially saw him in December 2010 at which time he was in atrial fibrillation. He underwent cardioversion on 01/27/10 successfully. He was then lost to followup until 05/14/13 at which time he returned in atrial fibrillation. Over that period of time he continued to get his prothrombin time faithfully. He is asymptomatic in terms of his atrial fibrillation. Not aware of his heart rate. He does have occasional mild lightheaded spells which may be related to his bradycardia. He's had a past history of hypertension.  He denies any chest pain or shortness of breath.  Energy level is satisfactory.  He does not have any history of ischemic heart disease or TIA or stroke  July 20, 2016:  Daniel Love is seen today  Has chronic atrial fib with slow HR  No syncope or presyncope  Aug. 31, 2018  Has rare episodes of chest congestion  / bronchitis, Feels like he needs to cough.  Walks about a mile a day without any problems.   December 17, 2018: Seen with wife and  Daughter , Daniel Love   Seen today for follow-up of his atrial fibrillation.  He remains on Coumadin. Is been started on Lasix 20 mg a day and isosorbide 15 mg a day since I last saw him. He was having lots of scrotum .  Finished his prostate therapy   ( XRT, Lupron)  In August Has had lots of leg edema since that time   Daniel Love saw him and did a pelvic CT scan to explore  the edema Was found to have a 2.3 cm renal mass ( likely cancer )   Feb. 24, 2020:  Daniel Love is seen today with his wife and his daughter, Daniel Love. He has been found to have a   4 x 6 fusiform ascending aortic aneurysm.  There is no evidence of that dissection.  He has seen [name] .   The threshold to consider surgical repair of this ascending aortic aneurysm is 5.5 cm.  He has been advised not to lift anything heavier than 35 pounds.  BP is a bit elevated.   Does still eat some of these foods.   Echo on Jan. 2020 shows mildly reduced LV function .   Moderate MR .   His peripheral edema has improved significantly with the Lasix.  Has a renal mass  September 29, 2019: Daniel Love is seen today with his wife.  He has a history of a sending aortic aneurysm.  He also has a history of mitral regurgitation.  He also has a renal mass that is likely cancer. He has chronic atrial fib.  TEE on March 04, 2019 revealed mild - mod reduction of LV function - EF 40-45%.  The MV was myxomatous and he had moderate MR   Able to do his normal activities without any diffuculties. Is on Lupron which makes him fatigued.   Has a renal mass  which is being followed.   Past Medical History:  Diagnosis Date  . Anal fissure    hx of   . Atrial fibrillation (HCC)    maintaining normal sinus rhythm  . Chronic anticoagulation    coumadin  . Dyspepsia   . GERD (gastroesophageal reflux disease)   . Glaucoma   . History of colon polyps 2012   Colonoscopy-Dr. Benson Norway   . History of echocardiogram    Echocardiogram 2/19: EF 60-65, normal wall motion, trivial AI, moderate to severe MR, mild LAE, mild RAE, mild to moderate TR, PASP 35  . History of kidney stones   . History of nuclear stress test    Myoview 2/19: EF 42, diffuse HK no significant ischemia, apical lateral scar  . Hypertension   . Prostate cancer ([place])   . Skin cancer    skin cancer removed from left arm and back    Past Surgical History:  Procedure  Laterality Date  . BACK SURGERY  12/31/1977   for degenerative disc disease  . COLONOSCOPY    . PROSTATE BIOPSY    . TEE WITHOUT CARDIOVERSION N/A 03/04/2019   Procedure: TRANSESOPHAGEAL ECHOCARDIOGRAM (TEE);  Surgeon: Acie Fredrickson Wonda Cheng, MD;  Location: [facility];  Service: Cardiovascular;  Laterality: N/A;  . UPPER GASTROINTESTINAL ENDOSCOPY       Current Outpatient Medications  Medication Sig Dispense Refill  . calcium carbonate (OSCAL) 1500 (600 Ca) MG TABS tablet Take 1,500 mg by mouth daily.    . carvedilol (COREG) 3.125 MG tablet Take 1 tablet (3.125 mg total) by mouth 2 (two) times daily. 60 tablet 11  . clotrimazole-betamethasone (LOTRISONE) cream Apply 1 application topically 2 (two) times daily as needed (yeast infection).    . diphenhydramine-acetaminophen (TYLENOL PM) 25-500 MG TABS tablet Take 1 tablet by mouth at bedtime as needed (sleep).    [name] esomeprazole (NEXIUM) 20 MG capsule Take 20 mg by mouth daily as needed (acid reflux).    . furosemide (LASIX) 20 MG tablet Take 20 mg by mouth daily.    . isosorbide mononitrate (IMDUR) 30 MG 24 hr tablet Take 30 mg by mouth daily.     [name] LUMIGAN 0.01 % SOLN Place 1 drop into both eyes daily.    . Menthol, Topical Analgesic, (ICY HOT BACK EX) Apply 1 application topically daily as needed (pain).    . Multiple Vitamins-Minerals (CENTRUM SILVER PO) Take 1 tablet by mouth every morning.      . nystatin cream (MYCOSTATIN) Apply 1 application topically as needed (RASH).     [name] olmesartan-hydrochlorothiazide (BENICAR HCT) 40-25 MG tablet Take 1 tablet by mouth daily. 30 tablet 11  . oxymetazoline (AFRIN) 0.05 % nasal spray Place 1 spray into both nostrils at bedtime as needed for congestion.    [name] warfarin (COUMADIN) 5 MG tablet TAKE AS DIRECTED BY COUMADIN CLINIC 120 tablet 2   No current facility-administered medications for this visit.     Allergies:   Amlodipine and Lisinopril    Social History:  The patient  reports that he quit  smoking about 52 years ago. His smoking use included cigarettes. He has a 7.00 pack-year smoking history. He has never used smokeless tobacco. He reports current alcohol use of about 7.0 standard drinks of alcohol per week. He reports that he does not use drugs.   Family History:  The patient's family history includes Cancer in his father and mother; Colon cancer in his maternal grandmother; Heart attack in his father; Heart failure in  his father.    ROS:  Please see the history of present illness.   Otherwise, review of systems are positive for none.   All other systems are reviewed and negative.    Physical Exam: Blood pressure 120/80, pulse 61, height 5\' 11"  (1.803 m), weight 198 lb 3.2 oz (89.9 kg), SpO2 97 %.  GEN:  Well nourished, well developed in no acute distress HEENT: Normal NECK: No JVD; No carotid bruits LYMPHATICS: No lymphadenopathy CARDIAC: Irreg. Irreg. , soft systolic murmur  RESPIRATORY:  Clear to auscultation without rales, wheezing or rhonchi  ABDOMEN: Soft, non-tender, non-distended MUSCULOSKELETAL:  No edema; No deformity  SKIN: Warm and dry NEUROLOGIC:  Alert and oriented x 3   EKG:    September 29, 2019: Atrial fibrillation at 61 beats minute.  Minimal voltage criteria for left ventricular pressure V.  Recent Labs: 02/23/2019: BUN 23; Creatinine, Ser 1.12; Hemoglobin 13.7; Platelets 138; Potassium 4.1; Sodium 144    Lipid Panel No results found for: CHOL, TRIG, HDL, CHOLHDL, VLDL, LDLCALC, LDLDIRECT    Wt Readings from Last 3 Encounters:  09/29/19 198 lb 3.2 oz (89.9 kg)  03/04/19 180 lb (81.6 kg)  02/23/19 186 lb 12.8 oz (84.7 kg)        ASSESSMENT AND PLAN:  1. Atrial fibrillation:  Cont.  Coumadin , HR is well controlled.   2. Essential HTN:   BP is well controlled.  Continue current medications.  He measures his blood pressure on a regular basis.  3.  Mitral regurgitation.   - moderate by TEE  He has a soft systolic murmur on exam.  We  will continue to follow.  4.   Chronic diastolic CHF:  Stable.  Seems to be very well controlled at this time.   Mertie Moores, MD  09/29/2019 12:41 PM    [address],  [place], [place]  [address] Pager [phone] Phone: [phone]; Fax: [phone]

## 2019-09-30 ENCOUNTER — Other Ambulatory Visit: Payer: Self-pay | Admitting: Cardiovascular Disease

## 2019-10-20 ENCOUNTER — Other Ambulatory Visit: Payer: Self-pay

## 2019-10-20 ENCOUNTER — Ambulatory Visit (INDEPENDENT_AMBULATORY_CARE_PROVIDER_SITE_OTHER): Payer: Medicare Other | Admitting: *Deleted

## 2019-10-20 DIAGNOSIS — N281 Cyst of kidney, acquired: Secondary | ICD-10-CM | POA: Diagnosis not present

## 2019-10-20 DIAGNOSIS — N2889 Other specified disorders of kidney and ureter: Secondary | ICD-10-CM | POA: Diagnosis not present

## 2019-10-20 DIAGNOSIS — I4821 Permanent atrial fibrillation: Secondary | ICD-10-CM | POA: Diagnosis not present

## 2019-10-20 DIAGNOSIS — Z7901 Long term (current) use of anticoagulants: Secondary | ICD-10-CM

## 2019-10-20 DIAGNOSIS — D3001 Benign neoplasm of right kidney: Secondary | ICD-10-CM | POA: Diagnosis not present

## 2019-10-20 LAB — POCT INR: INR: 2.4 (ref 2.0–3.0)

## 2019-10-20 NOTE — Patient Instructions (Signed)
Description   Continue taking 1 tablet daily except for 1.5 tablets on Sunday, Wednesday and Friday. Recheck in 6 weeks. Call us with any update or changes [phone].

## 2019-10-22 IMAGING — CT CT ABD-PELV W/ CM
2 of 5 series · 14 of 46 positions shown, 16 images · IV contrast (iopamidol)
Comparison: CT scan of March 28, 2018.

CLINICAL DATA: Scrotal edema.

EXAM:
CT ABDOMEN AND PELVIS WITH CONTRAST
TECHNIQUE: Multidetector CT imaging of the abdomen and pelvis was performed
using the standard protocol following bolus administration of
intravenous contrast.
CONTRAST:  100mL 6SODZ2-BLL IOPAMIDOL (6SODZ2-BLL) INJECTION 61%

[Series 2: abd pelvis 5.00 br40 s3 ax · axial · 0.71mm/px · z∈[+1249,+1709]mm · 11 of 110 slices shown, 13 images]
[im 9/110  soft-tissue]
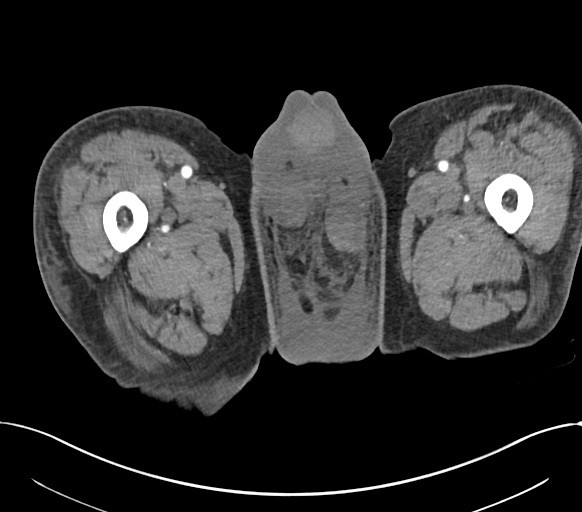
[im 9/110  bone]
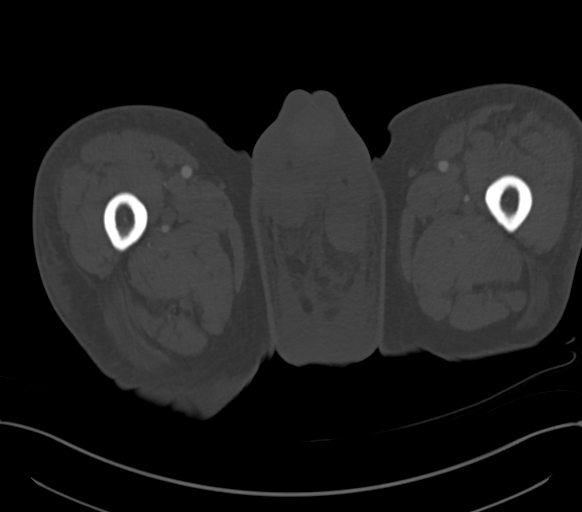
[im 17/110  soft-tissue]
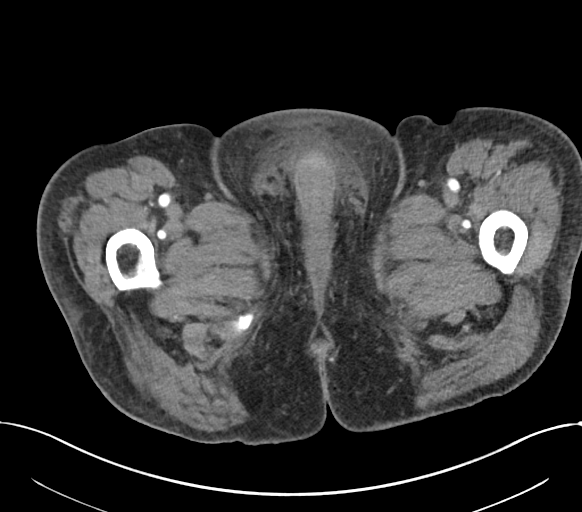
[im 26/110  soft-tissue]
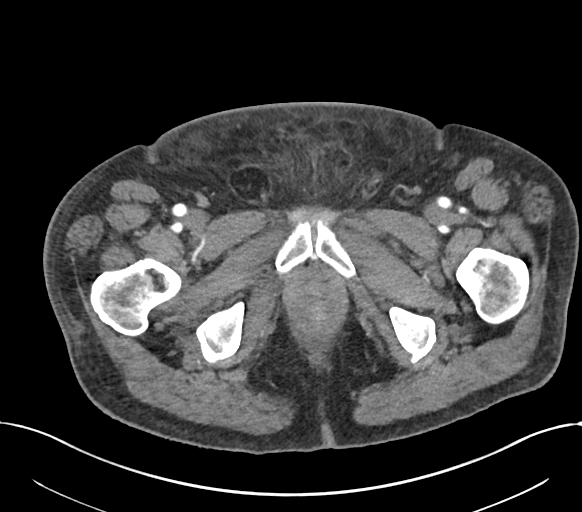
[im 34/110  soft-tissue]
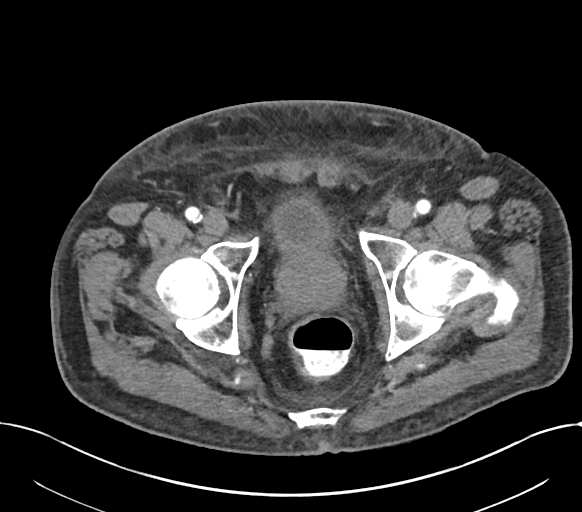
[im 42/110  soft-tissue]
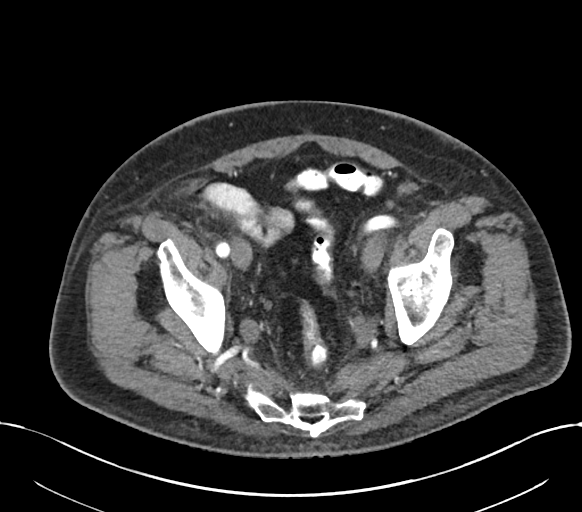
[im 59/110  soft-tissue]
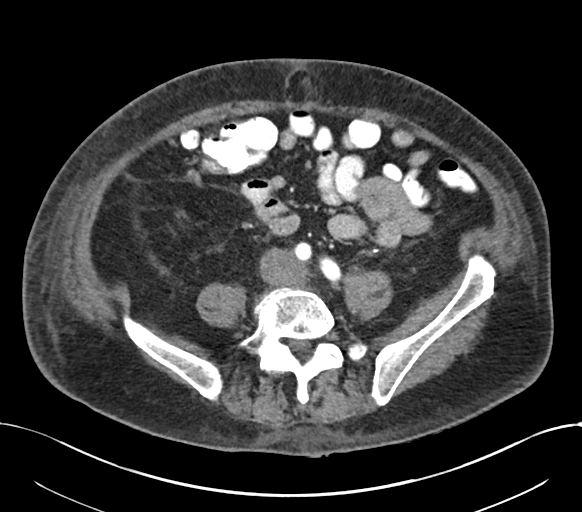
[im 68/110  soft-tissue]
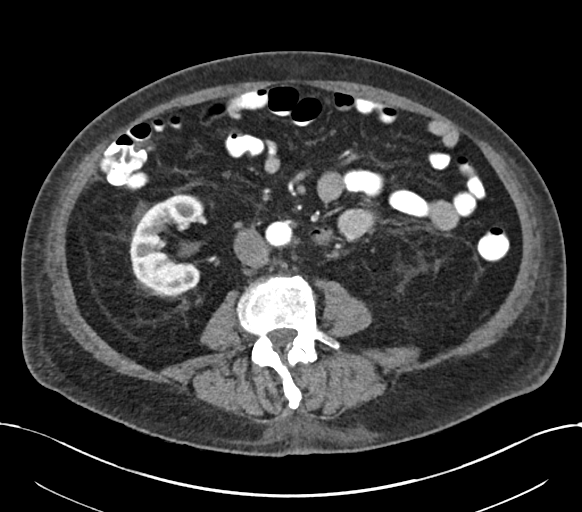
[im 76/110  soft-tissue]
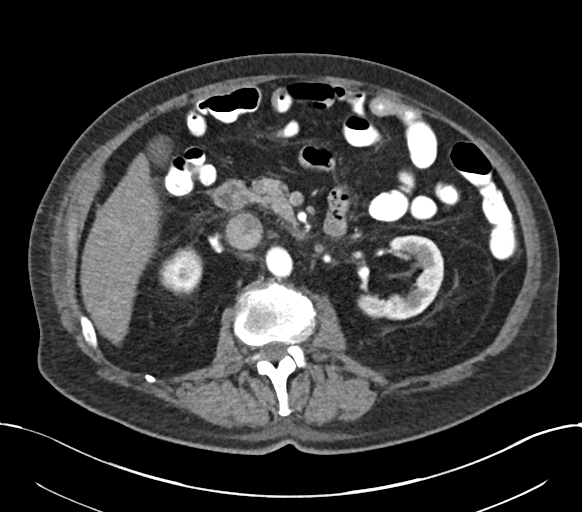
[im 84/110  soft-tissue]
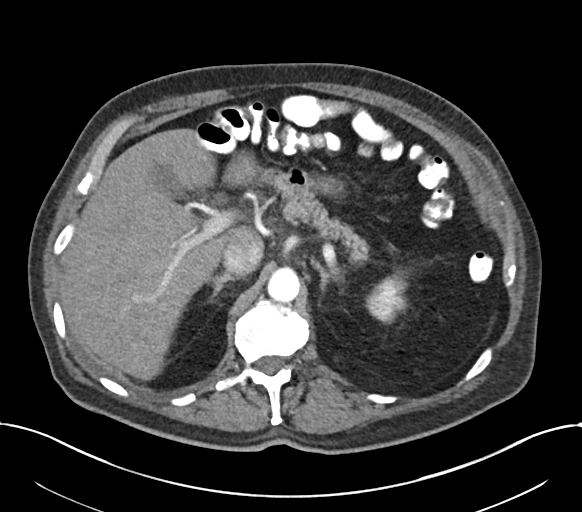
[im 84/110  bone]
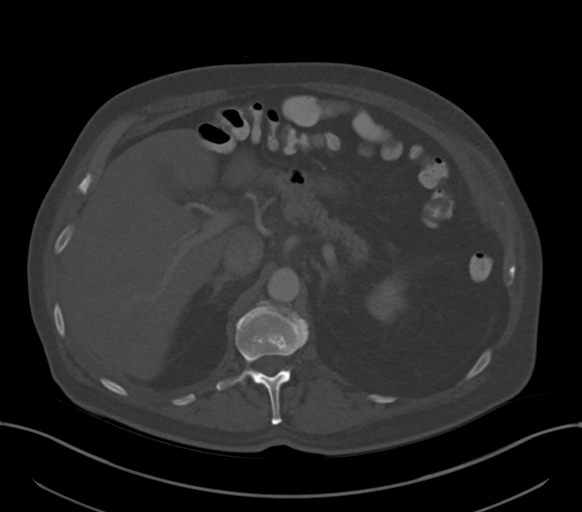
[im 93/110  soft-tissue]
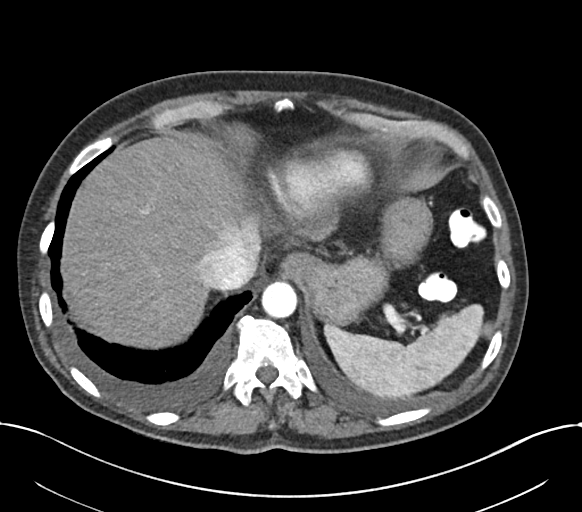
[im 101/110  soft-tissue]
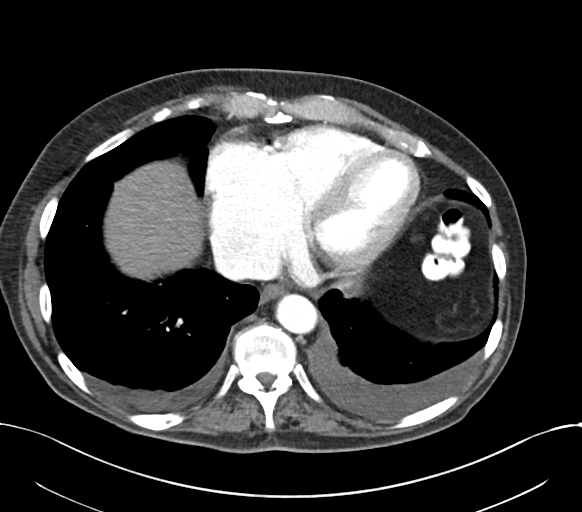

[Series 6: abd pelvis 2.00 br40 s3 cor · coronal · 0.81mm/px · 3 of 164 slices shown]
[im 55/164  soft-tissue]
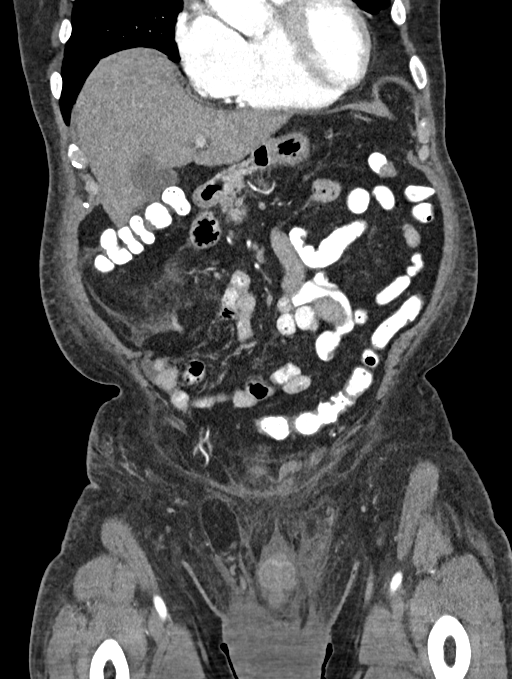
[im 73/164  soft-tissue]
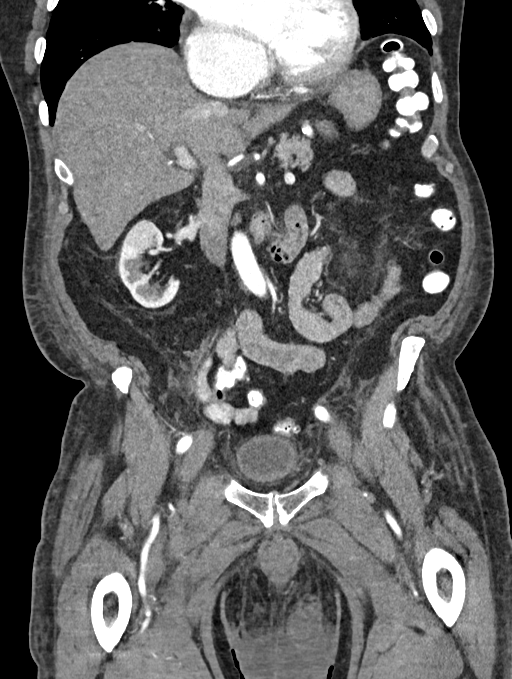
[im 91/164  soft-tissue]
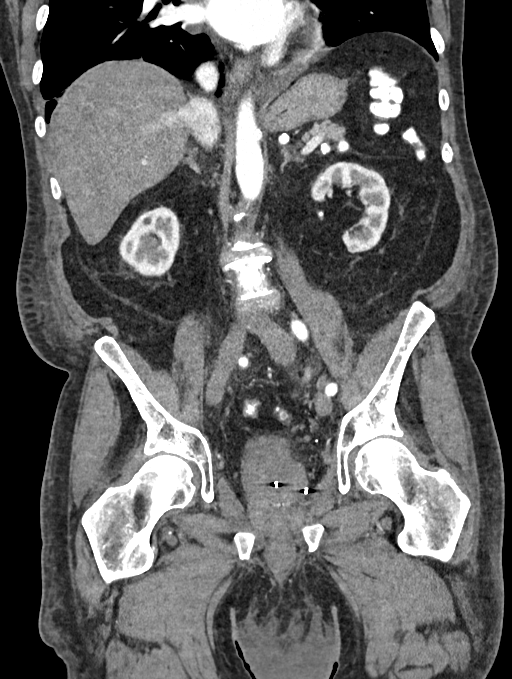

[14 of 46 positions shown; findings below may reference images not displayed]

FINDINGS: Lower chest: Minimal bilateral pleural effusions are noted with
adjacent subsegmental atelectasis.

Hepatobiliary: No gallstones or biliary dilatation is noted. Right
hepatic cyst is noted.

Pancreas: Unremarkable. No pancreatic ductal dilatation or
surrounding inflammatory changes.

Spleen: Normal in size without focal abnormality.

Adrenals/Urinary Tract: Adrenal glands appear normal. Left kidney
appears normal. 2.3 cm rounded enhancing abnormality is seen
anteriorly in midpole of right kidney concerning for neoplasm or
renal cell carcinoma. No hydronephrosis or renal obstruction is
noted. No renal or ureteral calculi are noted. Urinary bladder is
unremarkable.

Stomach/Bowel: Stomach is within normal limits. Appendix appears
normal. No evidence of bowel wall thickening, distention, or
inflammatory changes.

Vascular/Lymphatic: No significant vascular findings are present. No
enlarged abdominal or pelvic lymph nodes.

Reproductive: Prostate gland is slightly decreased in size compared
to prior exam. There is been interval placement of at least 3
metallic markers.

Other: Small fat containing right inguinal hernia is noted. Small
fat containing periumbilical hernia. There is interval development
of a large amount of edema involving the visualized portion of the
upper scrotum.

Musculoskeletal: No acute or significant osseous findings.
IMPRESSION: 2.3 cm rounded enhancing mass is noted in midpole of right kidney
concerning for renal cell carcinoma. Consultation with urology is
recommended. These results will be called to the ordering clinician
or representative by the Radiologist Assistant, and communication
documented in the PACS or zVision Dashboard.

Interval development of a large amount of edema involving the
visualized portion of the upper scrotum.

Prostate gland is enlarged but decreased in size compared to prior
exam, with interval placement of at least 3 metallic markers.

Minimal bilateral pleural effusions are noted with adjacent
subsegmental atelectasis.

Small fat containing right inguinal and periumbilical hernias.

## 2019-10-29 DIAGNOSIS — C61 Malignant neoplasm of prostate: Secondary | ICD-10-CM | POA: Diagnosis not present

## 2019-11-10 DIAGNOSIS — I10 Essential (primary) hypertension: Secondary | ICD-10-CM | POA: Diagnosis not present

## 2019-11-10 DIAGNOSIS — Z0000 Encounter for general adult medical examination without abnormal findings: Secondary | ICD-10-CM | POA: Diagnosis not present

## 2019-11-10 DIAGNOSIS — R1310 Dysphagia, unspecified: Secondary | ICD-10-CM | POA: Diagnosis not present

## 2019-11-10 DIAGNOSIS — I4821 Permanent atrial fibrillation: Secondary | ICD-10-CM | POA: Diagnosis not present

## 2019-11-10 DIAGNOSIS — Z8546 Personal history of malignant neoplasm of prostate: Secondary | ICD-10-CM | POA: Diagnosis not present

## 2019-11-18 ENCOUNTER — Other Ambulatory Visit: Payer: Self-pay | Admitting: Cardiovascular Disease

## 2019-12-01 ENCOUNTER — Ambulatory Visit (INDEPENDENT_AMBULATORY_CARE_PROVIDER_SITE_OTHER): Payer: Medicare Other | Admitting: *Deleted

## 2019-12-01 ENCOUNTER — Other Ambulatory Visit: Payer: Self-pay

## 2019-12-01 DIAGNOSIS — I4821 Permanent atrial fibrillation: Secondary | ICD-10-CM | POA: Diagnosis not present

## 2019-12-01 DIAGNOSIS — Z7901 Long term (current) use of anticoagulants: Secondary | ICD-10-CM

## 2019-12-01 LAB — POCT INR: INR: 2.9 (ref 2.0–3.0)

## 2019-12-01 NOTE — Patient Instructions (Signed)
Description   Continue taking 1 tablet daily except for 1.5 tablets on Sunday, Wednesday and Friday. Recheck in 6 weeks. Call us with any update or changes [phone].

## 2019-12-14 ENCOUNTER — Other Ambulatory Visit: Payer: Self-pay

## 2019-12-14 DIAGNOSIS — Z20822 Contact with and (suspected) exposure to covid-19: Secondary | ICD-10-CM

## 2019-12-16 LAB — NOVEL CORONAVIRUS, NAA: SARS-CoV-2, NAA: NOT DETECTED

## 2020-01-11 ENCOUNTER — Other Ambulatory Visit: Payer: Self-pay | Admitting: Surgery

## 2020-01-11 DIAGNOSIS — I712 Thoracic aortic aneurysm, without rupture, unspecified: Secondary | ICD-10-CM

## 2020-01-12 ENCOUNTER — Ambulatory Visit (INDEPENDENT_AMBULATORY_CARE_PROVIDER_SITE_OTHER): Payer: Medicare Other | Admitting: *Deleted

## 2020-01-12 ENCOUNTER — Other Ambulatory Visit: Payer: Self-pay

## 2020-01-12 DIAGNOSIS — I4821 Permanent atrial fibrillation: Secondary | ICD-10-CM | POA: Diagnosis not present

## 2020-01-12 DIAGNOSIS — Z7901 Long term (current) use of anticoagulants: Secondary | ICD-10-CM | POA: Diagnosis not present

## 2020-01-12 LAB — POCT INR: INR: 2.5 (ref 2.0–3.0)

## 2020-01-12 NOTE — Patient Instructions (Signed)
Description   Continue taking 1 tablet daily except for 1.5 tablets on Sunday, Wednesday and Friday. Recheck in 8 weeks. Call us with any update or changes [phone].

## 2020-02-15 ENCOUNTER — Other Ambulatory Visit: Payer: Self-pay | Admitting: Cardiovascular Disease

## 2020-02-17 ENCOUNTER — Ambulatory Visit
Admission: RE | Admit: 2020-02-17 | Discharge: 2020-02-17 | Disposition: A | Discharge status: Home / Self Care | Payer: Medicare Other | Source: Ambulatory Visit | Attending: Surgery | Admitting: Surgery

## 2020-02-17 ENCOUNTER — Encounter: Payer: Self-pay | Admitting: Surgery

## 2020-02-17 ENCOUNTER — Other Ambulatory Visit: Payer: Self-pay

## 2020-02-17 ENCOUNTER — Ambulatory Visit (INDEPENDENT_AMBULATORY_CARE_PROVIDER_SITE_OTHER): Payer: Medicare Other | Admitting: Surgery

## 2020-02-17 VITALS — BP 134/75 | HR 66 | Temp 97.3°F | Resp 16 | Ht 71.0 in | Wt 190.0 lb

## 2020-02-17 DIAGNOSIS — I712 Thoracic aortic aneurysm, without rupture, unspecified: Secondary | ICD-10-CM

## 2020-02-17 MED ORDER — IOPAMIDOL (ISOVUE-370) INJECTION 76%
75.0000 mL | Freq: Once | INTRAVENOUS | Status: AC | PRN
  Administered 2020-02-17: 75 mL via INTRAVENOUS

## 2020-02-17 NOTE — Progress Notes (Signed)
HPI:  This 80 year old gentleman returns today for follow-up of a 4.6 cm fusiform ascending aortic aneurysm.  Prior echocardiogram showed a trileaflet aortic valve with mild central regurgitation.  He said that he feels well without chest pain or shortness of breath.  He has no fatigue or peripheral edema.  Current Outpatient Medications  Medication Sig Dispense Refill  . calcium carbonate (OSCAL) 1500 (600 Ca) MG TABS tablet Take 1,500 mg by mouth daily.    . carvedilol (COREG) 3.125 MG tablet TAKE ONE TABLET BY MOUTH TWICE A DAY 180 tablet 1  . clotrimazole-betamethasone (LOTRISONE) cream Apply 1 application topically 2 (two) times daily as needed (yeast infection).    . diphenhydramine-acetaminophen (TYLENOL PM) 25-500 MG TABS tablet Take 1 tablet by mouth at bedtime as needed (sleep).    [name] esomeprazole (NEXIUM) 20 MG capsule Take 20 mg by mouth daily as needed (acid reflux).    . furosemide (LASIX) 20 MG tablet Take 20 mg by mouth daily.    . isosorbide mononitrate (IMDUR) 30 MG 24 hr tablet Take 30 mg by mouth daily.     [name] LUMIGAN 0.01 % SOLN Place 1 drop into both eyes daily.    . Menthol, Topical Analgesic, (ICY HOT BACK EX) Apply 1 application topically daily as needed (pain).    . Multiple Vitamins-Minerals (CENTRUM SILVER PO) Take 1 tablet by mouth every morning.      . nystatin cream (MYCOSTATIN) Apply 1 application topically as needed (RASH).     [name] olmesartan-hydrochlorothiazide (BENICAR HCT) 40-25 MG tablet TAKE ONE TABLET BY MOUTH DAILY 180 tablet 2  . oxymetazoline (AFRIN) 0.05 % nasal spray Place 1 spray into both nostrils at bedtime as needed for congestion.    [name] warfarin (COUMADIN) 5 MG tablet TAKE AS DIRECTED BY COUMADIN CLINIC 120 tablet 1   No current facility-administered medications for this visit.     Physical Exam: BP 134/75 (BP Location: Left Arm, Patient Position: Sitting, Cuff Size: Normal)   Pulse 66   Temp (!) 97.3 F (36.3 C)   Resp 16   Ht 5\' 11"   (1.803 m)   Wt 190 lb (86.2 kg)   SpO2 96% Comment: RA  BMI 26.50 kg/m  He looks well. Cardiac exam shows a regular rate and rhythm with normal heart sounds.  There is no murmur. Lungs are clear. There is no peripheral edema.  Diagnostic Tests:  CLINICAL DATA:  Follow-up thoracic aortic aneurysm.  EXAM: CT ANGIOGRAPHY CHEST WITH CONTRAST  TECHNIQUE: Multidetector CT imaging of the chest was performed using the standard protocol during bolus administration of intravenous contrast. Multiplanar CT image reconstructions and MIPs were obtained to evaluate the vascular anatomy.  CONTRAST:  46mL ISOVUE-370 IOPAMIDOL (ISOVUE-370) INJECTION 76%  COMPARISON:  Chest CT-01/13/2019  FINDINGS: Vascular Findings:  Redemonstrated mild fusiform aneurysmal dilatation of the ascending thoracic aorta with measurements as follows. The thoracic aorta tapers to a normal caliber at the level of the aortic arch. There is a minimal amount of eccentric calcified plaque involving the descending thoracic aorta, not resulting in a hemodynamically significant stenosis. No evidence of thoracic aortic dissection or periaortic stranding on this nongated examination.  Conventional configuration of the aortic arch. The branch vessels of the aortic arch appear tortuous though widely patent without hemodynamically significant narrowing.  Cardiomegaly. Coronary artery calcifications. No pericardial effusion.  Although this examination was not tailored for the evaluation the pulmonary arteries, there are no discrete filling defects within the central pulmonary arterial  tree to suggest central pulmonary embolism. Normal caliber of the main pulmonary artery.  -------------------------------------------------------------  Thoracic aortic measurements:  Sinotubular junction  33 mm as measured in greatest oblique short axis coronal dimension.  Proximal ascending aorta  44 mm as measured  in greatest oblique short axis axial dimension at the level of the main pulmonary artery and 45 mm in greatest oblique short axis coronal diameter (image 39, series 8), unchanged compared to the 12/2018 examination.  Aortic arch aorta  28 mm as measured in greatest oblique short axis sagittal dimension.  Proximal descending thoracic aorta  28 mm as measured in greatest oblique short axis axial dimension at the level of the main pulmonary artery.  Distal descending thoracic aorta  26 mm as measured in greatest oblique short axis axial dimension at the level of the diaphragmatic hiatus.  Review of the MIP images confirms the above findings.  -------------------------------------------------------------  Non-Vascular Findings:  Mediastinum/Lymph Nodes: No bulky mediastinal, hilar or axillary lymph adenopathy.  Lungs/Pleura: Minimal subsegmental atelectasis within the left lower lobe, lingula and medial aspect of the right lower lobe. No discrete focal airspace opacities. No pleural effusion or pneumothorax. The central pulmonary airways appear widely patent.  Punctate granuloma within the right lower lobe (image 87, series 4). No worrisome discrete pulmonary nodules.  Upper abdomen: Limited early arterial phase evaluation of the upper abdomen demonstrates a approximately 1.7 cm hypoattenuating cyst within the subcapsular aspect of the right lobe of the liver (image 83, series 4).  Musculoskeletal: No acute or aggressive osseous abnormalities. Stigmata of dish within the thoracic spine. Moderate to severe DDD of C6-C7 with disc space height loss, endplate irregularity and sclerosis.  Mild grossly symmetric bilateral gynecomastia. The thyroid gland appears atrophic but without discrete nodule.  IMPRESSION: 1. Stable uncomplicated fusiform aneurysmal dilatation of the ascending thoracic aorta measuring 44 mm in diameter. Aortic aneurysm NOS  (ICD10-I71.9). 2. Cardiomegaly. 3. Coronary calcifications.  Aortic Atherosclerosis (ICD10-I70.0).   Electronically Signed   By: Sandi Mariscal M.D.   On: 02/17/2020 10:55   Impression:  He has a stable 4.4 to 4.5 cm fusiform ascending aortic aneurysm.  This is well below the surgical threshold of 5.5 cm.  I reviewed the CT images with him and answered his questions.  I stressed the importance of continued good blood pressure control and preventing further enlargement and acute aortic dissection.  I have recommended that he have a follow-up CT scan in 1 year.  Plan:  He will return to see me in 1 year with a CTA of the chest.  I spent 20 minutes performing this established patient evaluation and > 50% of this time was spent face to face counseling and coordinating the care of this patient's aortic aneurysm.    Gaye Pollack, MD [facility] [phone]

## 2020-02-19 ENCOUNTER — Other Ambulatory Visit: Payer: Self-pay | Admitting: Cardiovascular Disease

## 2020-03-08 ENCOUNTER — Other Ambulatory Visit: Payer: Self-pay

## 2020-03-08 ENCOUNTER — Ambulatory Visit (INDEPENDENT_AMBULATORY_CARE_PROVIDER_SITE_OTHER): Payer: Medicare Other | Admitting: *Deleted

## 2020-03-08 DIAGNOSIS — Z7901 Long term (current) use of anticoagulants: Secondary | ICD-10-CM

## 2020-03-08 DIAGNOSIS — I4821 Permanent atrial fibrillation: Secondary | ICD-10-CM | POA: Diagnosis not present

## 2020-03-08 LAB — POCT INR: INR: 3.2 — AB (ref 2.0–3.0)

## 2020-03-08 NOTE — Patient Instructions (Signed)
Description   Do not take your Warfarin dose tomorrow then continue taking 1 tablet daily except for 1.5 tablets on Sunday, Wednesday and Friday. Recheck in 6 weeks (normally 8 weeks). Call us with any update or changes [phone].

## 2020-03-25 ENCOUNTER — Encounter: Payer: Self-pay | Admitting: Cardiovascular Disease

## 2020-03-25 ENCOUNTER — Other Ambulatory Visit: Payer: Self-pay

## 2020-03-25 ENCOUNTER — Ambulatory Visit (INDEPENDENT_AMBULATORY_CARE_PROVIDER_SITE_OTHER): Payer: Medicare Other | Admitting: Cardiovascular Disease

## 2020-03-25 VITALS — BP 118/58 | HR 53 | Ht 71.0 in | Wt 187.0 lb

## 2020-03-25 DIAGNOSIS — I4821 Permanent atrial fibrillation: Secondary | ICD-10-CM | POA: Diagnosis not present

## 2020-03-25 DIAGNOSIS — Z7901 Long term (current) use of anticoagulants: Secondary | ICD-10-CM | POA: Diagnosis not present

## 2020-03-25 NOTE — Patient Instructions (Signed)
Medication Instructions:  Your physician has recommended you make the following change in your medication:  STOP Carvedilol (Coreg)  *If you need a refill on your cardiac medications before your next appointment, please call your pharmacy*   Lab Work: None Ordered If you have labs (blood work) drawn today and your tests are completely normal, you will receive your results only by: [name] MyChart Message (if you have MyChart) OR . A paper copy in the mail If you have any lab test that is abnormal or we need to change your treatment, we will call you to review the results.    Testing/Procedures: None Ordered    Follow-Up: At [facility], you and your health needs are our priority.  As part of our continuing mission to provide you with exceptional heart care, we have created designated Provider Care Teams.  These Care Teams include your primary Cardiologist (physician) and Advanced Practice Providers (APPs -  Physician Assistants and Nurse Practitioners) who all work together to provide you with the care you need, when you need it.  We recommend signing up for the patient portal called "MyChart".  Sign up information is provided on this After Visit Summary.  MyChart is used to connect with patients for Virtual Visits (Telemedicine).  Patients are able to view lab/test results, encounter notes, upcoming appointments, etc.  Non-urgent messages can be sent to your provider as well.   To learn more about what you can do with MyChart, go to [email or web address].    Your next appointment:   6 month(s)  The format for your next appointment:   Either In Person or Virtual  Provider:   Richardson Dopp, PA-C or Robbie Lis, PA-C

## 2020-03-25 NOTE — Progress Notes (Signed)
Cardiology Office Note   Date:  03/25/2020   ID:  Daniel Love, DOB [birth year], MRN [id number]  PCP:  Daniel Noon, MD  Cardiologist: Daniel Coco MD / Daniel Love   Chief Complaint  Patient presents with  . Atrial Fibrillation   Problem list 1. Chronic atrial fibrillation 2. Essential hypertension 3.   July 12, 2015 - notes from Daniel Love is a 80 y.o. male who presents for a one-year follow-up visit  This 80 year old gentleman is seen for a one-year followup office visit. He has a history of permanent atrial fibrillation. We initially saw him in December 2010 at which time he was in atrial fibrillation. He underwent cardioversion on 01/27/10 successfully. He was then lost to followup until 05/14/13 at which time he returned in atrial fibrillation. Over that period of time he continued to get his prothrombin time faithfully. He is asymptomatic in terms of his atrial fibrillation. Not aware of his heart rate. He does have occasional mild lightheaded spells which may be related to his bradycardia. He's had a past history of hypertension.  He denies any chest pain or shortness of breath.  Energy level is satisfactory.  He does not have any history of ischemic heart disease or TIA or stroke  July 20, 2016:  Daniel Love is seen today  Has chronic atrial fib with slow HR  No syncope or presyncope  Aug. 31, 2018  Has rare episodes of chest congestion  / bronchitis, Feels like he needs to cough.  Walks about a mile a day without any problems.   December 17, 2018: Seen with wife and  Daughter , Daniel Love   Seen today for follow-up of his atrial fibrillation.  He remains on Coumadin. Is been started on Lasix 20 mg a day and isosorbide 15 mg a day since I last saw him. He was having lots of scrotum .  Finished his prostate therapy   ( XRT, Daniel Love)  In August Has had lots of leg edema since that time   Lim Daniel Love saw him and did a pelvic CT scan to explore  the edema Was found to have a 2.3 cm renal mass ( likely cancer )   Feb. 24, 2020:  Daniel Love is seen today with his wife and his daughter, Daniel Love. He has been found to have a   4 x 6 fusiform ascending aortic aneurysm.  There is no evidence of that dissection.  He has seen Dr. Cyndia Love .   The threshold to consider surgical repair of this ascending aortic aneurysm is 5.5 cm.  He has been advised not to lift anything heavier than 35 pounds.  BP is a bit elevated.   Does still eat some of these foods.   Echo on Jan. 2020 shows mildly reduced LV function .   Moderate MR .   His peripheral edema has improved significantly with the Lasix.  Has a renal mass  September 29, 2019: Daniel Love is seen today with his wife.  He has a history of a sending aortic aneurysm.  He also has a history of mitral regurgitation.  He also has a renal mass that is likely cancer. He has chronic atrial fib.  TEE on March 04, 2019 revealed mild - mod reduction of LV function - EF 40-45%.  The MV was myxomatous and he had moderate MR   Able to do his normal activities without any diffuculties. Is on Daniel Love which makes him fatigued.   Has a renal mass  which is being followed.   March 25, 2020:  Daniel Love is seen today for follow up of of his atrial fib, MR,  Ascending aortic aneurism ( followed by Daniel Love)  Has a renal mass ( followed by Daniel Love, Daniel Meckel, MD  )  Fatigued frequently .   Has noticed that his HR is slow  Had an unusual mid chest pain ,  Was sitting in chair when it occurred.  Lasted abouit an hour.   Took one of his wife's NTG.    Pain resolved after an hours  He gets some exercise,  Walks some every day .    Past Medical History:  Diagnosis Date  . Anal fissure    hx of   . Atrial fibrillation (HCC)    maintaining normal sinus rhythm  . Chronic anticoagulation    coumadin  . Dyspepsia   . GERD (gastroesophageal reflux disease)   . Glaucoma   . History of colon polyps 2012    Colonoscopy-Dr. Benson Love   . History of echocardiogram    Echocardiogram 2/19: EF 60-65, normal wall motion, trivial AI, moderate to severe MR, mild LAE, mild RAE, mild to moderate TR, PASP 35  . History of kidney stones   . History of nuclear stress test    Myoview 2/19: EF 42, diffuse HK no significant ischemia, apical lateral scar  . Hypertension   . Prostate cancer (Daniel Love)   . Skin cancer    skin cancer removed from left arm and back    Past Surgical History:  Procedure Laterality Date  . BACK SURGERY  12/31/1977   for degenerative disc disease  . COLONOSCOPY    . PROSTATE BIOPSY    . TEE WITHOUT CARDIOVERSION N/A 03/04/2019   Procedure: TRANSESOPHAGEAL ECHOCARDIOGRAM (TEE);  Surgeon: Daniel Fredrickson Wonda Cheng, MD;  Location: [facility];  Service: Cardiovascular;  Laterality: N/A;  . UPPER GASTROINTESTINAL ENDOSCOPY       Current Outpatient Medications  Medication Sig Dispense Refill  . calcium carbonate (OSCAL) 1500 (600 Ca) MG TABS tablet Take 1,500 mg by mouth daily.    . carvedilol (COREG) 3.125 MG tablet TAKE ONE TABLET BY MOUTH TWICE A DAY 180 tablet 1  . clotrimazole-betamethasone (LOTRISONE) cream Apply 1 application topically 2 (two) times daily as needed (yeast infection).    . diphenhydramine-acetaminophen (TYLENOL PM) 25-500 MG TABS tablet Take 1 tablet by mouth at bedtime as needed (sleep).    [name] esomeprazole (NEXIUM) 20 MG capsule Take 20 mg by mouth daily as needed (acid reflux).    . furosemide (LASIX) 20 MG tablet Take 20 mg by mouth daily.    . isosorbide mononitrate (IMDUR) 30 MG 24 hr tablet Take 30 mg by mouth daily.     [name] LUMIGAN 0.01 % SOLN Place 1 drop into both eyes daily.    . Menthol, Topical Analgesic, (ICY HOT BACK EX) Apply 1 application topically daily as needed (pain).    . Multiple Vitamins-Minerals (CENTRUM SILVER PO) Take 1 tablet by mouth every morning.      . nystatin cream (MYCOSTATIN) Apply 1 application topically as needed (RASH).     [name]  olmesartan-hydrochlorothiazide (BENICAR HCT) 40-25 MG tablet TAKE ONE TABLET BY MOUTH DAILY 180 tablet 2  . oxymetazoline (AFRIN) 0.05 % nasal spray Place 1 spray into both nostrils at bedtime as needed for congestion.    . timolol (TIMOPTIC) 0.5 % ophthalmic solution daily. 1 drop in both eyes    . warfarin (COUMADIN) 5 MG tablet  TAKE AS DIRECTED BY COUMADIN CLINIC 120 tablet 1   No current facility-administered medications for this visit.    Allergies:   Amlodipine and Lisinopril    Social History:  The patient  reports that he quit smoking about 53 years ago. His smoking use included cigarettes. He has a 7.00 pack-year smoking history. He has never used smokeless tobacco. He reports current alcohol use of about 7.0 standard drinks of alcohol per week. He reports that he does not use drugs.   Family History:  The patient's family history includes Cancer in his father and mother; Colon cancer in his maternal grandmother; Heart attack in his father; Heart failure in his father.    ROS:  Please see the history of present illness.   Otherwise, review of systems are positive for none.   All other systems are reviewed and negative.    Physical Exam: Blood pressure (!) 118/58, pulse (!) 53, height 5\' 11"  (1.803 m), weight 187 lb (84.8 kg), SpO2 98 %.  GEN:  Elderly male  HEENT: Normal NECK: No JVD; No carotid bruits LYMPHATICS: No lymphadenopathy CARDIAC: Irreg. Irreg.  RESPIRATORY:  Clear to auscultation without rales, wheezing or rhonchi  ABDOMEN: Soft, non-tender, non-distended MUSCULOSKELETAL:  No edema; No deformity  SKIN: Warm and dry NEUROLOGIC:  Alert and oriented x 3   EKG:       Recent Labs: No results found for requested labs within last 8760 hours.    Lipid Panel No results found for: CHOL, TRIG, HDL, CHOLHDL, VLDL, LDLCALC, LDLDIRECT    Wt Readings from Last 3 Encounters:  03/25/20 187 lb (84.8 kg)  02/17/20 190 lb (86.2 kg)  09/29/19 198 lb 3.2 oz (89.9 kg)          ASSESSMENT AND PLAN:  1. Atrial fibrillation:     Stable,   HR is slow.   Will DC coreg to see if this helps with his slow HR and helps his fatigue   2. Essential HTN:   BP is well controlled.   3.  Mitral regurgitation.   -  Stable     4.   Chronic diastolic CHF:   No dyspnea.  Watches his salt   Mertie Moores, MD  03/25/2020 9:55 AM    [facility] [address],  [place], [place]  [address] Pager [phone] Phone: [phone]; Fax: [phone]

## 2020-04-19 ENCOUNTER — Ambulatory Visit (INDEPENDENT_AMBULATORY_CARE_PROVIDER_SITE_OTHER): Payer: Medicare Other | Admitting: Pharmacist

## 2020-04-19 ENCOUNTER — Other Ambulatory Visit: Payer: Self-pay

## 2020-04-19 DIAGNOSIS — I4821 Permanent atrial fibrillation: Secondary | ICD-10-CM | POA: Diagnosis not present

## 2020-04-19 DIAGNOSIS — Z7901 Long term (current) use of anticoagulants: Secondary | ICD-10-CM

## 2020-04-19 LAB — POCT INR: INR: 3 (ref 2.0–3.0)

## 2020-04-19 NOTE — Patient Instructions (Signed)
Continue taking 1 tablet daily except for 1.5 tablets on Sunday, Wednesday and Friday. Recheck in 6 weeks (normally 8 weeks). Call us with any update or changes [phone].

## 2020-05-31 ENCOUNTER — Ambulatory Visit (INDEPENDENT_AMBULATORY_CARE_PROVIDER_SITE_OTHER): Payer: Medicare Other | Admitting: Pharmacist

## 2020-05-31 ENCOUNTER — Other Ambulatory Visit: Payer: Self-pay

## 2020-05-31 DIAGNOSIS — I4821 Permanent atrial fibrillation: Secondary | ICD-10-CM | POA: Diagnosis not present

## 2020-05-31 DIAGNOSIS — Z7901 Long term (current) use of anticoagulants: Secondary | ICD-10-CM

## 2020-05-31 LAB — POCT INR: INR: 3.2 — AB (ref 2.0–3.0)

## 2020-05-31 NOTE — Patient Instructions (Signed)
Description   Take 1/2 tablet tomorrow, then continue taking 1 tablet daily except for 1.5 tablets on Sunday, Wednesday and Friday. Recheck in 5 weeks. Call us with any update or changes [phone].

## 2020-07-05 ENCOUNTER — Other Ambulatory Visit: Payer: Self-pay

## 2020-07-05 ENCOUNTER — Ambulatory Visit (INDEPENDENT_AMBULATORY_CARE_PROVIDER_SITE_OTHER): Payer: Medicare Other

## 2020-07-05 DIAGNOSIS — I4821 Permanent atrial fibrillation: Secondary | ICD-10-CM

## 2020-07-05 DIAGNOSIS — Z7901 Long term (current) use of anticoagulants: Secondary | ICD-10-CM

## 2020-07-05 LAB — POCT INR: INR: 3 (ref 2.0–3.0)

## 2020-07-05 NOTE — Patient Instructions (Signed)
continue taking 1 tablet daily except for 1.5 tablets on Sunday, Wednesday and Friday. Recheck in 6 weeks. Call us with any update or changes [phone].

## 2020-08-01 ENCOUNTER — Other Ambulatory Visit: Payer: Self-pay | Admitting: Cardiovascular Disease

## 2020-08-16 ENCOUNTER — Ambulatory Visit (INDEPENDENT_AMBULATORY_CARE_PROVIDER_SITE_OTHER): Payer: Medicare Other

## 2020-08-16 ENCOUNTER — Other Ambulatory Visit: Payer: Self-pay

## 2020-08-16 DIAGNOSIS — Z7901 Long term (current) use of anticoagulants: Secondary | ICD-10-CM | POA: Diagnosis not present

## 2020-08-16 DIAGNOSIS — I4821 Permanent atrial fibrillation: Secondary | ICD-10-CM

## 2020-08-16 LAB — POCT INR: INR: 2.5 (ref 2.0–3.0)

## 2020-08-16 NOTE — Patient Instructions (Signed)
Description   Continue taking 1 tablet daily except for 1.5 tablets on Sundays, Wednesdays and Fridays. Recheck in 6 weeks. Call us with any update or changes [phone].

## 2020-09-19 ENCOUNTER — Ambulatory Visit (INDEPENDENT_AMBULATORY_CARE_PROVIDER_SITE_OTHER): Payer: Medicare Other | Admitting: *Deleted

## 2020-09-19 ENCOUNTER — Ambulatory Visit (INDEPENDENT_AMBULATORY_CARE_PROVIDER_SITE_OTHER): Payer: Medicare Other | Admitting: Cardiovascular Disease

## 2020-09-19 ENCOUNTER — Encounter: Payer: Self-pay | Admitting: Cardiovascular Disease

## 2020-09-19 ENCOUNTER — Other Ambulatory Visit: Payer: Self-pay

## 2020-09-19 VITALS — BP 120/84 | HR 54 | Ht 70.0 in | Wt 186.8 lb

## 2020-09-19 DIAGNOSIS — I4821 Permanent atrial fibrillation: Secondary | ICD-10-CM | POA: Diagnosis not present

## 2020-09-19 DIAGNOSIS — Z7901 Long term (current) use of anticoagulants: Secondary | ICD-10-CM

## 2020-09-19 LAB — POCT INR: INR: 2.3 (ref 2.0–3.0)

## 2020-09-19 MED ORDER — FUROSEMIDE 20 MG PO TABS: 20.0000 mg | ORAL_TABLET | Freq: Every day | ORAL | 6 refills | Status: DC | PRN

## 2020-09-19 NOTE — Patient Instructions (Signed)
Medication Instructions:  1) DECREASE LASIX to once daily AS NEEDED for swelling or shortness of breath *If you need a refill on your cardiac medications before your next appointment, please call your pharmacy*  Follow-Up: At [facility], you and your health needs are our priority.  As part of our continuing mission to provide you with exceptional heart care, we have created designated Provider Care Teams.  These Care Teams include your primary Cardiologist (physician) and Advanced Practice Providers (APPs -  Physician Assistants and Nurse Practitioners) who all work together to provide you with the care you need, when you need it. Your next appointment:   6 month(s) The format for your next appointment:   In Person Provider:   You may see Dr. Glenford Bayley or one of the following Advanced Practice Providers on your designated Care Team:    Melina Copa, PA-C  Ermalinda Barrios, PA-C

## 2020-09-19 NOTE — Patient Instructions (Signed)
Description   °Continue taking Warfarin 1 tablet daily except for 1.5 tablets on Sundays, Wednesdays and Fridays. Recheck in 6 weeks. Call us with any update or changes #[phone].  °  ° ° °

## 2020-09-19 NOTE — Progress Notes (Signed)
Cardiology Office Note   Date:  09/19/2020   ID:  Daniel Love, DOB 1940/07/11, MRN [id number]  PCP:  Chesley Noon, MD  Cardiologist: Darlin Coco MD / Jarelly Rinck   Chief Complaint  Patient presents with  . Atrial Fibrillation  . Hypertension   Problem list 1. Chronic atrial fibrillation 2. Essential hypertension 3.   July 12, 2015 - notes from Daniel Love is a 80 y.o. male who presents for a one-year follow-up visit  This 80 year old gentleman is seen for a one-year followup office visit. He has a history of permanent atrial fibrillation. We initially saw him in December 2010 at which time he was in atrial fibrillation. He underwent cardioversion on 01/27/10 successfully. He was then lost to followup until 05/14/13 at which time he returned in atrial fibrillation. Over that period of time he continued to get his prothrombin time faithfully. He is asymptomatic in terms of his atrial fibrillation. Not aware of his heart rate. He does have occasional mild lightheaded spells which may be related to his bradycardia. He's had a past history of hypertension.  He denies any chest pain or shortness of breath.  Energy level is satisfactory.  He does not have any history of ischemic heart disease or TIA or stroke  July 20, 2016:  Daniel Love is seen today  Has chronic atrial fib with slow HR  No syncope or presyncope  Aug. 31, 2018  Has rare episodes of chest congestion  / bronchitis, Feels like he needs to cough.  Walks about a mile a day without any problems.   December 17, 2018: Seen with wife and  Daughter , Daniel Love   Seen today for follow-up of his atrial fibrillation.  He remains on Coumadin. Is been started on Lasix 20 mg a day and isosorbide 15 mg a day since I last saw him. He was having lots of scrotum .  Finished his prostate therapy   ( XRT, Lupron)  In August Has had lots of leg edema since that time   Daniel Love saw him and did a pelvic CT  scan to explore the edema Was found to have a 2.3 cm renal mass ( likely cancer )   Feb. 24, 2020:  Daniel Love is seen today with his wife and his daughter, Daniel Love. He has been found to have a   4 x 6 fusiform ascending aortic aneurysm.  There is no evidence of that dissection.  He has seen [name] .   The threshold to consider surgical repair of this ascending aortic aneurysm is 5.5 cm.  He has been advised not to lift anything heavier than 35 pounds.  BP is a bit elevated.   Does still eat some of these foods.   Echo on Jan. 2020 shows mildly reduced LV function .   Moderate MR .   His peripheral edema has improved significantly with the Lasix.  Has a renal mass  September 29, 2019: Daniel Love is seen today with his wife.  He has a history of a sending aortic aneurysm.  He also has a history of mitral regurgitation.  He also has a renal mass that is likely cancer. He has chronic atrial fib.  TEE on March 04, 2019 revealed mild - mod reduction of LV function - EF 40-45%.  The MV was myxomatous and he had moderate MR   Able to do his normal activities without any diffuculties. Is on Lupron which makes him fatigued.   Has  a renal mass which is being followed.   March 25, 2020:  Daniel Love is seen today for follow up of of his atrial fib, MR,  Ascending aortic aneurism ( followed by [name])  Has a renal mass ( followed by [name], [name], MD  )  Fatigued frequently .   Has noticed that his HR is slow  Had an unusual mid chest pain ,  Was sitting in chair when it occurred.  Lasted abouit an hour.   Took one of his wife's NTG.    Pain resolved after an hours  He gets some exercise,  Walks some every day .   Sept, 20, 2021: Seen for follow up of his Afi Has a renal mass ,  Is being followed by urology  Wants to stop / reduce the dose of lasix  Was started for mild pulmonary HTN   Past Medical History:  Diagnosis Date  . Anal fissure    hx of   . Atrial fibrillation (HCC)     maintaining normal sinus rhythm  . Chronic anticoagulation    coumadin  . Dyspepsia   . GERD (gastroesophageal reflux disease)   . Glaucoma   . History of colon polyps 2012   Colonoscopy-Dr. Benson Norway   . History of echocardiogram    Echocardiogram 2/19: EF 60-65, normal wall motion, trivial AI, moderate to severe MR, mild LAE, mild RAE, mild to moderate TR, PASP 35  . History of kidney stones   . History of nuclear stress test    Myoview 2/19: EF 42, diffuse HK no significant ischemia, apical lateral scar  . Hypertension   . Prostate cancer ([place])   . Skin cancer    skin cancer removed from left arm and back    Past Surgical History:  Procedure Laterality Date  . BACK SURGERY  12/31/1977   for degenerative disc disease  . COLONOSCOPY    . PROSTATE BIOPSY    . TEE WITHOUT CARDIOVERSION N/A 03/04/2019   Procedure: TRANSESOPHAGEAL ECHOCARDIOGRAM (TEE);  Surgeon: Acie Fredrickson Wonda Cheng, MD;  Location: [facility];  Service: Cardiovascular;  Laterality: N/A;  . UPPER GASTROINTESTINAL ENDOSCOPY       Current Outpatient Medications  Medication Sig Dispense Refill  . calcium carbonate (OSCAL) 1500 (600 Ca) MG TABS tablet Take 1,500 mg by mouth daily.    . clotrimazole-betamethasone (LOTRISONE) cream Apply 1 application topically 2 (two) times daily as needed (yeast infection).    . diphenhydramine-acetaminophen (TYLENOL PM) 25-500 MG TABS tablet Take 1 tablet by mouth at bedtime as needed (sleep).    [name] esomeprazole (NEXIUM) 20 MG capsule Take 20 mg by mouth daily as needed (acid reflux).    . furosemide (LASIX) 20 MG tablet Take 20 mg by mouth daily.    . isosorbide mononitrate (IMDUR) 30 MG 24 hr tablet Take 30 mg by mouth daily.     [name] LUMIGAN 0.01 % SOLN Place 1 drop into both eyes daily.    . Menthol, Topical Analgesic, (ICY HOT BACK EX) Apply 1 application topically daily as needed (pain).    . Multiple Vitamins-Minerals (CENTRUM SILVER PO) Take 1 tablet by mouth every morning.      .  nystatin cream (MYCOSTATIN) Apply 1 application topically as needed (RASH).     [name] olmesartan-hydrochlorothiazide (BENICAR HCT) 40-25 MG tablet TAKE ONE TABLET BY MOUTH DAILY 180 tablet 2  . oxymetazoline (AFRIN) 0.05 % nasal spray Place 1 spray into both nostrils at bedtime as needed for congestion.    [name]  terbinafine (LAMISIL) 250 MG tablet Take 250 mg by mouth daily.    . timolol (TIMOPTIC) 0.5 % ophthalmic solution daily. 1 drop in both eyes    . warfarin (COUMADIN) 5 MG tablet TAKE 1 TO 1.5 TABLETS DAILY AS DIRECTED BY COUMADIN CLINIC 120 tablet 1   No current facility-administered medications for this visit.    Allergies:   Amlodipine and Lisinopril    Social History:  The patient  reports that he quit smoking about 53 years ago. His smoking use included cigarettes. He has a 7.00 pack-year smoking history. He has never used smokeless tobacco. He reports current alcohol use of about 7.0 standard drinks of alcohol per week. He reports that he does not use drugs.   Family History:  The patient's family history includes Cancer in his father and mother; Colon cancer in his maternal grandmother; Heart attack in his father; Heart failure in his father.    ROS:  Please see the history of present illness.   Otherwise, review of systems are positive for none.   All other systems are reviewed and negative.    Physical Exam: Blood pressure 120/84, pulse (!) 54, height 5\' 10"  (1.778 m), weight 186 lb 12.8 oz (84.7 kg), SpO2 93 %.  GEN:   Elderly male,  NAD  HEENT: Normal NECK: No JVD; No carotid bruits LYMPHATICS: No lymphadenopathy CARDIAC: Irreg. Irreg.  RESPIRATORY:  Clear to auscultation without rales, wheezing or rhonchi  ABDOMEN: Soft, non-tender, non-distended MUSCULOSKELETAL:  No edema; No deformity  SKIN: Warm and dry NEUROLOGIC:  Alert and oriented x 3  EKG:     September 19, 2020: Atrial fibrillation with a slow ventricular response.  No ST or T wave changes.  Recent Labs: No  results found for requested labs within last 8760 hours.    Lipid Panel No results found for: CHOL, TRIG, HDL, CHOLHDL, VLDL, LDLCALC, LDLDIRECT    Wt Readings from Last 3 Encounters:  09/19/20 186 lb 12.8 oz (84.7 kg)  03/25/20 187 lb (84.8 kg)  02/17/20 190 lb (86.2 kg)        ASSESSMENT AND PLAN:  1. Atrial fibrillation:     Stable , HR remains slow but he has not signs / symptoms of syncope ,  Energy levels are good   2. Essential HTN:    BP is well controled.    3.  Mitral regurgitation.   -  Stable      4.   Chronic diastolic CHF:    No dysnpea ,  Will dC lasix and follow .   Advised him that he might have to take it once or twice a week to keep his volume controlled.  He seems to be very stable.  Since I am only part-time now and have limited ofice time , we will have him establish with Dr. Gasper Sells for follow up .     Mertie Moores, MD  09/19/2020 11:55 AM    [address],  [place], [place]  [address] Pager [phone] Phone: [phone]; Fax: [phone]

## 2020-11-01 ENCOUNTER — Ambulatory Visit (INDEPENDENT_AMBULATORY_CARE_PROVIDER_SITE_OTHER): Payer: Medicare Other

## 2020-11-01 ENCOUNTER — Other Ambulatory Visit: Payer: Self-pay

## 2020-11-01 DIAGNOSIS — I4821 Permanent atrial fibrillation: Secondary | ICD-10-CM

## 2020-11-01 DIAGNOSIS — Z7901 Long term (current) use of anticoagulants: Secondary | ICD-10-CM | POA: Diagnosis not present

## 2020-11-01 LAB — POCT INR: INR: 2.8 (ref 2.0–3.0)

## 2020-11-01 NOTE — Patient Instructions (Signed)
Description   Continue taking Warfarin 1 tablet daily except for 1.5 tablets on Sundays, Wednesdays and Fridays. Recheck in 6 weeks. Call us with any update or changes [phone].

## 2020-11-17 ENCOUNTER — Other Ambulatory Visit (HOSPITAL_COMMUNITY): Payer: Self-pay | Admitting: Orthopedic Surgery

## 2020-11-17 ENCOUNTER — Other Ambulatory Visit: Payer: Self-pay | Admitting: Orthopedic Surgery

## 2020-11-17 DIAGNOSIS — M79601 Pain in right arm: Secondary | ICD-10-CM

## 2020-11-17 DIAGNOSIS — M25311 Other instability, right shoulder: Secondary | ICD-10-CM

## 2020-11-17 DIAGNOSIS — S46101A Unspecified injury of muscle, fascia and tendon of long head of biceps, right arm, initial encounter: Secondary | ICD-10-CM

## 2020-12-03 ENCOUNTER — Other Ambulatory Visit: Payer: Self-pay | Admitting: Cardiovascular Disease

## 2020-12-13 ENCOUNTER — Ambulatory Visit (INDEPENDENT_AMBULATORY_CARE_PROVIDER_SITE_OTHER): Payer: Medicare Other

## 2020-12-13 ENCOUNTER — Other Ambulatory Visit: Payer: Self-pay

## 2020-12-13 DIAGNOSIS — I4821 Permanent atrial fibrillation: Secondary | ICD-10-CM

## 2020-12-13 DIAGNOSIS — Z7901 Long term (current) use of anticoagulants: Secondary | ICD-10-CM

## 2020-12-13 LAB — POCT INR: INR: 3.2 — AB (ref 2.0–3.0)

## 2020-12-13 NOTE — Patient Instructions (Signed)
Description   Skip tomorrow's dosage of Warfarin, then resume same dosage 1 tablet daily except for 1.5 tablets on Sundays, Wednesdays and Fridays. Recheck in 5 weeks. Call us with any update or changes [phone].

## 2021-01-16 ENCOUNTER — Other Ambulatory Visit: Payer: Self-pay | Admitting: Surgery

## 2021-01-16 DIAGNOSIS — I712 Thoracic aortic aneurysm, without rupture, unspecified: Secondary | ICD-10-CM

## 2021-01-19 DIAGNOSIS — L821 Other seborrheic keratosis: Secondary | ICD-10-CM | POA: Diagnosis not present

## 2021-01-19 DIAGNOSIS — I788 Other diseases of capillaries: Secondary | ICD-10-CM | POA: Diagnosis not present

## 2021-01-19 DIAGNOSIS — L812 Freckles: Secondary | ICD-10-CM | POA: Diagnosis not present

## 2021-01-19 DIAGNOSIS — D1801 Hemangioma of skin and subcutaneous tissue: Secondary | ICD-10-CM | POA: Diagnosis not present

## 2021-01-19 DIAGNOSIS — Z85828 Personal history of other malignant neoplasm of skin: Secondary | ICD-10-CM | POA: Diagnosis not present

## 2021-01-19 DIAGNOSIS — L570 Actinic keratosis: Secondary | ICD-10-CM | POA: Diagnosis not present

## 2021-01-24 ENCOUNTER — Ambulatory Visit (INDEPENDENT_AMBULATORY_CARE_PROVIDER_SITE_OTHER): Payer: Medicare Other | Admitting: *Deleted

## 2021-01-24 ENCOUNTER — Other Ambulatory Visit: Payer: Self-pay

## 2021-01-24 DIAGNOSIS — Z7901 Long term (current) use of anticoagulants: Secondary | ICD-10-CM

## 2021-01-24 DIAGNOSIS — Z5181 Encounter for therapeutic drug level monitoring: Secondary | ICD-10-CM

## 2021-01-24 DIAGNOSIS — I4821 Permanent atrial fibrillation: Secondary | ICD-10-CM

## 2021-01-24 LAB — POCT INR: INR: 2.6 (ref 2.0–3.0)

## 2021-01-24 NOTE — Patient Instructions (Signed)
Description   Continue same dosage 1 tablet daily except for 1.5 tablets on Sundays, Wednesdays and Fridays. Recheck in 6 weeks. Call us with any update or changes #[phone].      

## 2021-02-13 ENCOUNTER — Other Ambulatory Visit: Payer: Self-pay | Admitting: Cardiovascular Disease

## 2021-02-22 ENCOUNTER — Ambulatory Visit
Admission: RE | Admit: 2021-02-22 | Discharge: 2021-02-22 | Disposition: A | Discharge status: Home / Self Care | Payer: Medicare Other | Source: Ambulatory Visit | Attending: Surgery | Admitting: Surgery

## 2021-02-22 ENCOUNTER — Ambulatory Visit (INDEPENDENT_AMBULATORY_CARE_PROVIDER_SITE_OTHER): Payer: Medicare Other | Admitting: Surgery

## 2021-02-22 ENCOUNTER — Other Ambulatory Visit: Payer: Self-pay

## 2021-02-22 ENCOUNTER — Encounter: Payer: Self-pay | Admitting: Surgery

## 2021-02-22 VITALS — BP 118/70 | HR 67 | Temp 97.7°F | Resp 20 | Ht 70.0 in | Wt 189.0 lb

## 2021-02-22 DIAGNOSIS — I712 Thoracic aortic aneurysm, without rupture, unspecified: Secondary | ICD-10-CM

## 2021-02-22 MED ORDER — IOPAMIDOL (ISOVUE-370) INJECTION 76%
75.0000 mL | Freq: Once | INTRAVENOUS | Status: AC | PRN
  Administered 2021-02-22: 75 mL via INTRAVENOUS

## 2021-02-22 NOTE — Progress Notes (Signed)
HPI:  The patient returns today for follow-up of a 4.5 cm fusiform ascending aortic aneurysm.  He has been feeling well and has had no chest pain or back pain.  He denies any shortness of breath.  Current Outpatient Medications  Medication Sig Dispense Refill  . calcium carbonate (OSCAL) 1500 (600 Ca) MG TABS tablet Take 1,500 mg by mouth daily.    . clotrimazole-betamethasone (LOTRISONE) cream Apply 1 application topically 2 (two) times daily as needed (yeast infection).    . diphenhydramine-acetaminophen (TYLENOL PM) 25-500 MG TABS tablet Take 1 tablet by mouth at bedtime as needed (sleep).    [name] esomeprazole (NEXIUM) 20 MG capsule Take 20 mg by mouth daily as needed (acid reflux).    . furosemide (LASIX) 20 MG tablet Take 1 tablet (20 mg total) by mouth daily as needed. 30 tablet 6  . isosorbide mononitrate (IMDUR) 30 MG 24 hr tablet Take 30 mg by mouth daily.     [name] LUMIGAN 0.01 % SOLN Place 1 drop into both eyes daily.    . Menthol, Topical Analgesic, (ICY HOT BACK EX) Apply 1 application topically daily as needed (pain).    . Multiple Vitamins-Minerals (CENTRUM SILVER PO) Take 1 tablet by mouth every morning.    . nystatin cream (MYCOSTATIN) Apply 1 application topically as needed (RASH).     [name] olmesartan-hydrochlorothiazide (BENICAR HCT) 40-25 MG tablet TAKE ONE TABLET BY MOUTH DAILY 90 tablet 1  . oxymetazoline (AFRIN) 0.05 % nasal spray Place 1 spray into both nostrils at bedtime as needed for congestion.    . terbinafine (LAMISIL) 250 MG tablet Take 250 mg by mouth daily.    . timolol (TIMOPTIC) 0.5 % ophthalmic solution daily. 1 drop in both eyes    . warfarin (COUMADIN) 5 MG tablet TAKE 1 TO 1 AND 1/2 TABLET BY MOUTH TABLETS BY MOUTH DAILY AS INSTRUCTED BY COUMADIN CLINIC 120 tablet 1   No current facility-administered medications for this visit.     Physical Exam: BP 118/70   Pulse 67   Temp 97.7 F (36.5 C) (Skin)   Resp 20   Ht 5\' 10"  (1.778 m)   Wt 189 lb (85.7  kg)   SpO2 96% Comment: RA  BMI 27.12 kg/m  He looks well. Cardiac exam shows regular rate and rhythm with normal heart sounds.  There is no murmur. Lung exam is clear.   Diagnostic Tests:  Narrative & Impression  CLINICAL DATA:  Thoracic aortic prominence  EXAM: CT ANGIOGRAPHY CHEST WITH CONTRAST  TECHNIQUE: Multidetector CT imaging of the chest was performed using the standard protocol during bolus administration of intravenous contrast. Multiplanar CT image reconstructions and MIPs were obtained to evaluate the vascular anatomy.  CONTRAST:  92mL ISOVUE-370 IOPAMIDOL (ISOVUE-370) INJECTION 76%  Creatinine 1.3; GFR 53  COMPARISON:  Chest CT February 17, 2020  FINDINGS: Cardiovascular: Ascending thoracic aortic diameter measures 4.5 x 4.5 cm, stable compared to the previous study. Thoracic aortic diameter measured at the sinuses of Valsalva measures 4.2 cm. The measured diameter at the sinotubular junction measures 3.3 cm. Measured diameter at the arch measures 2.8 cm. Measured diameter of the descending aorta at the main pulmonary artery level measures 2.6 x 2.7 cm. No evident thoracic aortic dissection. There is again noted tortuosity in the right innominate artery, stable. No appreciable atherosclerotic irregularity noted in the visualized great vessels. There is no appreciable pulmonary embolus. There is no appreciable pericardial effusion or pericardial thickening. There are scattered foci  of coronary artery calcification.  Mediastinum/Nodes: Thyroid appears unremarkable. No evident thoracic adenopathy. No esophageal lesions are evident.  Lungs/Pleura: Areas of atelectatic change noted in the lung bases. Small calcified granulomas noted in the posterior right lung base. No edema or airspace opacity evident. No pleural effusions. No pneumothorax. Trachea and major bronchial structures appear patent.  Upper Abdomen: There is a cyst in the anterior  segment of the right lobe of the liver measuring 1.8 x 1.6 cm, stable. Visualized upper abdominal structures otherwise appear unremarkable.  Musculoskeletal: There are foci of degenerative change in the lower cervical and thoracic regions. No blastic or lytic bone lesions. No evident chest wall lesions. Mild gynecomastia noted.  Review of the MIP images confirms the above findings.  IMPRESSION: 1. Stable prominence of the ascending thoracic aorta measuring 4.5 x 4.5 cm. Other thoracic aortic measurements as noted. No evident dissection. Ascending thoracic aortic aneurysm. Recommend semi-annual imaging followup by CTA or MRA and referral to cardiothoracic surgery if not already obtained. This recommendation follows 2010 ACCF/AHA/AATS/ACR/ASA/SCA/SCAI/SIR/STS/SVM Guidelines for the Diagnosis and Management of Patients With Thoracic Aortic Disease. Circulation. 2010; 121: B403-J096. Aortic aneurysm NOS (ICD10-I71.9). Scattered foci of coronary artery calcification noted.  2.  No evident pulmonary embolus.  3. Occasional calcified granulomas. Areas of atelectatic change, stable. No edema or airspace opacity.  4.  No evident adenopathy.   Electronically Signed   By: Lowella Grip III M.D.   On: 02/22/2021 09:55      Impression:  He has a stable 4.5 cm fusiform ascending aortic aneurysm.  This is well below the surgical threshold of 5.5 cm.  Previous echocardiogram in 2020 showed a trileaflet aortic valve with mild thickening and calcification.  There is mild aortic insufficiency.  I reviewed the CT images with him and answered his questions.  I stressed the importance of continued good blood pressure control in preventing enlargement and acute aortic dissection.  Plan:  I will plan to see him back in 2 years with a CTA of the chest.  I spent 15 minutes performing this established patient evaluation and > 50% of this time was spent face to face counseling and  coordinating the care of this patient's aortic aneurysm.    Gaye Pollack, MD [facility] [phone]

## 2021-03-07 ENCOUNTER — Other Ambulatory Visit: Payer: Self-pay

## 2021-03-07 ENCOUNTER — Ambulatory Visit (INDEPENDENT_AMBULATORY_CARE_PROVIDER_SITE_OTHER): Payer: Medicare Other

## 2021-03-07 DIAGNOSIS — Z7901 Long term (current) use of anticoagulants: Secondary | ICD-10-CM

## 2021-03-07 DIAGNOSIS — I4821 Permanent atrial fibrillation: Secondary | ICD-10-CM

## 2021-03-07 DIAGNOSIS — Z5181 Encounter for therapeutic drug level monitoring: Secondary | ICD-10-CM

## 2021-03-07 LAB — POCT INR: INR: 3.2 — AB (ref 2.0–3.0)

## 2021-03-07 NOTE — Patient Instructions (Signed)
-   since you've already taken your warfarin today, take 1/2 tablet tomorrow, then - Continue same dosage 1 tablet daily except for 1.5 tablets on Sundays, Wednesdays and Fridays.  - Recheck in 6 weeks. Call us with any update or changes [phone].

## 2021-03-09 ENCOUNTER — Telehealth: Payer: Self-pay

## 2021-03-09 NOTE — Telephone Encounter (Signed)
ERROR

## 2021-03-20 ENCOUNTER — Encounter: Payer: Self-pay | Admitting: Internal Medicine

## 2021-03-20 ENCOUNTER — Other Ambulatory Visit: Payer: Self-pay

## 2021-03-20 ENCOUNTER — Ambulatory Visit (INDEPENDENT_AMBULATORY_CARE_PROVIDER_SITE_OTHER): Payer: Medicare Other | Admitting: Internal Medicine

## 2021-03-20 VITALS — BP 108/62 | HR 86 | Ht 70.0 in | Wt 187.8 lb

## 2021-03-20 DIAGNOSIS — I5020 Unspecified systolic (congestive) heart failure: Secondary | ICD-10-CM

## 2021-03-20 DIAGNOSIS — I4821 Permanent atrial fibrillation: Secondary | ICD-10-CM

## 2021-03-20 DIAGNOSIS — I080 Rheumatic disorders of both mitral and aortic valves: Secondary | ICD-10-CM | POA: Diagnosis not present

## 2021-03-20 DIAGNOSIS — D521 Drug-induced folate deficiency anemia: Secondary | ICD-10-CM

## 2021-03-20 DIAGNOSIS — I071 Rheumatic tricuspid insufficiency: Secondary | ICD-10-CM

## 2021-03-20 DIAGNOSIS — I361 Nonrheumatic tricuspid (valve) insufficiency: Secondary | ICD-10-CM | POA: Diagnosis not present

## 2021-03-20 DIAGNOSIS — Z7901 Long term (current) use of anticoagulants: Secondary | ICD-10-CM | POA: Diagnosis not present

## 2021-03-20 DIAGNOSIS — D509 Iron deficiency anemia, unspecified: Secondary | ICD-10-CM | POA: Diagnosis not present

## 2021-03-20 NOTE — Progress Notes (Signed)
Cardiology Office Note:    Date:  03/20/2021   ID:  Daniel Love, DOB 27-Mar-1940, MRN [id number]  PCP:  Chesley Noon, MD   [place]  Cardiologist:  Mertie Moores, MD -[place] MD Advanced Practice Provider:  No care team member to display Electrophysiologist:  None       Referring MD: Chesley Noon, MD   CC:  Re-establish care from 2021  History of Present Illness:    Daniel Love is a 81 y.o. male with a hx of HTN Permanent atrial fibrillation with moderate atrial functional MR, Moderate aortic aneurysm (4.5 cm-> Ascending Aortic Heigh Index) seen by [name] who presents to re-establish care.  Patient notes that he is feeling well.  Since prior visit notes that he has some fatigue and tiredness changes. Notes when we walks half a mile he feels tired and fatigue.  Relevant interval testing or therapy include CT Aorta.  There are no interval hospital/ED visit.    No chest pain or pressure .  No SOB/DOE and no PND/Orthopnea.  No weight gain or leg swelling- wears compression stockings from [employer] that work wonderful.  No palpitations or syncope.   Past Medical History:  Diagnosis Date  . Anal fissure    hx of   . Atrial fibrillation (HCC)    maintaining normal sinus rhythm  . Chronic anticoagulation    coumadin  . Dyspepsia   . GERD (gastroesophageal reflux disease)   . Glaucoma   . History of colon polyps 2012   Colonoscopy-Dr. Benson Norway   . History of echocardiogram    Echocardiogram 2/19: EF 60-65, normal wall motion, trivial AI, moderate to severe MR, mild LAE, mild RAE, mild to moderate TR, PASP 35  . History of kidney stones   . History of nuclear stress test    Myoview 2/19: EF 42, diffuse HK no significant ischemia, apical lateral scar  . Hypertension   . Prostate cancer ([place])   . Skin cancer    skin cancer removed from left arm and back    Past Surgical History:  Procedure Laterality Date  . BACK SURGERY   12/31/1977   for degenerative disc disease  . COLONOSCOPY    . PROSTATE BIOPSY    . TEE WITHOUT CARDIOVERSION N/A 03/04/2019   Procedure: TRANSESOPHAGEAL ECHOCARDIOGRAM (TEE);  Surgeon: Thayer Headings, MD;  Location: [facility];  Service: Cardiovascular;  Laterality: N/A;  . UPPER GASTROINTESTINAL ENDOSCOPY      Current Medications: Current Meds  Medication Sig  . calcium carbonate (OSCAL) 1500 (600 Ca) MG TABS tablet Take 1,500 mg by mouth daily.  . clotrimazole-betamethasone (LOTRISONE) cream Apply 1 application topically 2 (two) times daily as needed (yeast infection).  . diphenhydramine-acetaminophen (TYLENOL PM) 25-500 MG TABS tablet Take 1 tablet by mouth at bedtime as needed (sleep).  [name] esomeprazole (NEXIUM) 20 MG capsule Take 20 mg by mouth daily as needed (acid reflux).  . furosemide (LASIX) 20 MG tablet Take 1 tablet (20 mg total) by mouth daily as needed.  . isosorbide mononitrate (IMDUR) 30 MG 24 hr tablet Take 30 mg by mouth daily.   [name] LUMIGAN 0.01 % SOLN Place 1 drop into both eyes daily.  . Menthol, Topical Analgesic, (ICY HOT BACK EX) Apply 1 application topically daily as needed (pain).  . Multiple Vitamins-Minerals (CENTRUM SILVER PO) Take 1 tablet by mouth every morning.  . nystatin cream (MYCOSTATIN) Apply 1 application topically as needed (RASH).   [name]  olmesartan-hydrochlorothiazide (BENICAR HCT) 40-25 MG tablet TAKE ONE TABLET BY MOUTH DAILY  . oxymetazoline (AFRIN) 0.05 % nasal spray Place 1 spray into both nostrils at bedtime as needed for congestion.  . terbinafine (LAMISIL) 250 MG tablet Take 250 mg by mouth daily.  . timolol (TIMOPTIC) 0.5 % ophthalmic solution daily. 1 drop in both eyes  . warfarin (COUMADIN) 5 MG tablet TAKE 1 TO 1 AND 1/2 TABLET BY MOUTH TABLETS BY MOUTH DAILY AS INSTRUCTED BY COUMADIN CLINIC     Allergies:   Amlodipine and Lisinopril   Social History   Socioeconomic History  . Marital status: Married    Spouse name: Not on file  .  Number of children: 3  . Years of education: Not on file  . Highest education level: Not on file  Occupational History  . Occupation: Retired  Tobacco Use  . Smoking status: Former Smoker    Packs/day: 1.00    Years: 7.00    Pack years: 7.00    Types: Cigarettes    Quit date: 12/31/1966    Years since quitting: 54.2  . Smokeless tobacco: Never Used  Vaping Use  . Vaping Use: Never used  Substance and Sexual Activity  . Alcohol use: Yes    Alcohol/week: 7.0 standard drinks    Types: 7 Glasses of wine per week    Comment: 1 daily   . Drug use: No  . Sexual activity: Not Currently  Other Topics Concern  . 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 They share three daughters, two live local, one in [place]   09-26-18 Unable to ask abuse questions wife and daughter with him today.   Social Determinants of Health   Financial Resource Strain: Not on file  Food Insecurity: Not on file  Transportation Needs: Not on file  Physical Activity: Not on file  Stress: Not on file  Social Connections: Not on file     Family History: The patient's family history includes Cancer in his father and mother; Colon cancer in his maternal grandmother; Heart attack in his father; Heart failure in his father. There is no history of Stroke, Esophageal cancer, Rectal cancer, or Stomach cancer.  ROS:   Please see the history of present illness.     All other systems reviewed and are negative.  EKGs/Labs/Other Studies Reviewed:    The following studies were reviewed today:  EKG:   09/19/20:  AF rate 54 with slow ventricular response  Transthoracic Echocardiogram: Date: 01/02/2019 Results: Left ventricle: The cavity size was normal. Wall thickness was  increased in a pattern of moderate LVH. Systolic function was  mildly reduced. The estimated ejection fraction was in the range  of 45% to 50%. Diffuse hypokinesis. There was no  evidence of  elevated ventricular filling pressure by Doppler parameters.  - Aortic valve: There was mild regurgitation.  - Mitral valve: There was moderate regurgitation.  - Left atrium: The atrium was moderately dilated.  - Right ventricle: The cavity size was mildly dilated. Wall  thickness was normal.  - Right atrium: The atrium was severely dilated.  - Tricuspid valve: There was moderate regurgitation.  - Pulmonary arteries: Systolic pressure was mildly increased. PA  peak pressure: 44 mm Hg (S).   Transesophageal Echocardiogram: Date: 03/04/2019 Results: 1. The left ventricle has mild-moderately reduced systolic function, with  an ejection fraction of 40-45%. The cavity size was normal. Left  ventricular  diffuse hypokinesis.  2. The right ventricle has normal systolc function. The cavity was  normal. There is no increase in right ventricular wall thickness.  3. Left atrial size was severely dilated.  4. 6.1cm left atrial diameter in 4 chamber view.  5. Right atrial size was severely dilated.  6. The mitral valve is myxomatous. Moderate thickening of the posterior  mitral valve leaflet. Mitral valve regurgitation is moderate by color flow  Doppler.    8. The tricuspid valve was myxomatous. Tricuspid valve regurgitation is  moderate-severe.  9. The aortic valve is tricuspid Mild thickening of the aortic valve Mild  calcification of the aortic valve. Aortic valve regurgitation is mild by  color flow Doppler. The jet is posteriorly-directed.  10. The pulmonic valve was normal in structure.  11. There is dilatation of the ascending aorta measuring 41 mm.  12. Pulmonary hypertension is mild.   CT Aorta: Date:2/23/20222 Results: IMPRESSION: 1. Stable prominence of the ascending thoracic aorta measuring 4.5 x 4.5 cm. Other thoracic aortic measurements as noted. No evident dissection. Ascending thoracic aortic aneurysm. Recommend semi-annual imaging followup by  CTA or MRA and referral to cardiothoracic surgery if not already obtained. This recommendation follows 2010 ACCF/AHA/AATS/ACR/ASA/SCA/SCAI/SIR/STS/SVM Guidelines for the Diagnosis and Management of Patients With Thoracic Aortic Disease. Circulation. 2010; 121: V371-G626. Aortic aneurysm NOS (ICD10-I71.9). Scattered foci of coronary artery calcification noted.  2.  No evident pulmonary embolus.  3. Occasional calcified granulomas. Areas of atelectatic change, stable. No edema or airspace opacity.  4.  No evident adenopathy.  NM Stress Testing : Date:02/17/2018 Results:  Nuclear stress EF: 42%.  There was no ST segment deviation noted during stress.  This is an intermediate risk study.  The left ventricular ejection fraction is moderately decreased (30-44%).   1. EF 42%, diffuse hypokinesis.  2. Fixed small, mild apical lateral perfusion defect.  Possible prior infarction, no evidence for significant ischemia.     Recent Labs: No results found for requested labs within last 8760 hours.  Recent Lipid Panel No results found for: CHOL, TRIG, HDL, CHOLHDL, VLDL, LDLCALC, LDLDIRECT   Risk Assessment/Calculations:    CHA2DS2-VASc Score = 4  This indicates a 4.8% annual risk of stroke. The patient's score is based upon: CHF History: Yes HTN History: Yes Diabetes History: No Stroke History: No Vascular Disease History: No Age Score: 2 Gender Score: 0      Physical Exam:    VS:  BP 108/62   Pulse 86   Ht 5\' 10"  (1.778 m)   Wt 187 lb 12.8 oz (85.2 kg)   SpO2 97%   BMI 26.95 kg/m     Wt Readings from Last 3 Encounters:  03/20/21 187 lb 12.8 oz (85.2 kg)  02/22/21 189 lb (85.7 kg)  09/19/20 186 lb 12.8 oz (84.7 kg)    GEN: Elderly Male well developed in no acute distress HEENT: Normal NECK: No JVD; No carotid bruits LYMPHATICS: No lymphadenopathy CARDIAC: Rate controlled irregular rhythm, holosystolic murmur best heard after longest RR  intervals RESPIRATORY:  Clear to auscultation without rales, wheezing or rhonchi  ABDOMEN: Soft, non-tender, non-distended MUSCULOSKELETAL:  No edema; No deformity  SKIN: Warm and dry NEUROLOGIC:  Alert and oriented x 3 PSYCHIATRIC:  Normal affect   ASSESSMENT:    1. Long term (current) use of anticoagulants   2. Nonrheumatic tricuspid valve regurgitation   3. Mitral regurgitation and aortic stenosis   4. Moderate tricuspid regurgitation   5. Permanent atrial fibrillation ([place])   6.  HFrEF (heart failure with reduced ejection fraction) ([place])   7. Iron deficiency anemia, unspecified iron deficiency anemia type   8. Drug-induced folate deficiency anemia    PLAN:    In order of problems listed above:  Moderate Mitral Regurgitation- Atrial Functional MR Moderate Tricuspid Regurgitation HFmrEF - NYHA class I, Stage B, euvolemic, etiology from AF though no prior ischemic tseting - Diuretic regimen: Patient has had success with his lasix 20 mg PO Daily and 25 mg HCTZ combination; will continue for now - Discussed the importance of fluid restriction of < 2 L, salt restriction, and checking daily weights  - Likely would not tolerate BB  - Continue Olmesartan 40 mg - continue Imdur - TTE ordered  -Transferring Sat %/Ferritin unknown; sending anemia panel and CBC - given persistent exertional fatigue, we have discussed the risks and benefits of cardiac catheterization have been discussed with the patient.  These include bleeding, infection, kidney damage, stroke, heart attack, death.  The patient understands these risks and is willing to proceed if he is still symptomatic post echo and other testing.  Permanent Atrial Fibrillation - Risk factors include Age X2 and HF, HTN - CHADSVASC=4.  - Will get obtain TTE.   - Continue anticoagulation with coumadin.  - Rate controlled without BB - if persistent fatigue can offer EP evaluation   Asymptomatic thoracic aortic aneurysm- Care lead By  [name] - Last at 4.5, rate of growth minimal < 48mm - Last scan 02/22/21 - Primary Prevention:  HTN, CAD, Smoking Cessation  - 1st degree relative one time screening will be further discussed at next visit - Discussed not using Fluoroquinolones- will be further discussed at next visit  Fall follow up unless new symptoms or abnormal test results warranting change in plan  Would be reasonable for  APP Follow up        Medication Adjustments/Labs and Tests Ordered: Current medicines are reviewed at length with the patient today.  Concerns regarding medicines are outlined above.  Orders Placed This Encounter  Procedures  . CBC  . Iron and TIBC  . Ferritin  . ECHOCARDIOGRAM COMPLETE   No orders of the defined types were placed in this encounter.   Patient Instructions  Medication Instructions:  Your physician recommends that you continue on your current medications as directed. Please refer to the Current Medication list given to you today.  *If you need a refill on your cardiac medications before your next appointment, please call your pharmacy*   Lab Work: TODAY: anemia panel and CBC If you have labs (blood work) drawn today and your tests are completely normal, you will receive your results only by: [name] MyChart Message (if you have MyChart) OR . A paper copy in the mail If you have any lab test that is abnormal or we need to change your treatment, we will call you to review the results.   Testing/Procedures: Your physician has requested that you have an echocardiogram. Echocardiography is a painless test that uses sound waves to create images of your heart. It provides your doctor with information about the size and shape of your heart and how well your heart's chambers and valves are working. This procedure takes approximately one hour. There are no restrictions for this procedure.    Follow-Up: At [facility], you and your health needs are our priority.  As part of  our continuing mission to provide you with exceptional heart care, we have created designated Provider Care Teams.  These Care  Teams include your primary Cardiologist (physician) and Advanced Practice Providers (APPs -  Physician Assistants and Nurse Practitioners) who all work together to provide you with the care you need, when you need it.  We recommend signing up for the patient portal called "MyChart".  Sign up information is provided on this After Visit Summary.  MyChart is used to connect with patients for Virtual Visits (Telemedicine).  Patients are able to view lab/test results, encounter notes, upcoming appointments, etc.  Non-urgent messages can be sent to your provider as well.   To learn more about what you can do with MyChart, go to [email or web address].    Your next appointment:   6-7 month(s)  The format for your next appointment:   In Person  Provider:   You may see Rudean Haskell, MD or one of the following Advanced Practice Providers on your designated Care Team:    Melina Copa, PA-C  Ermalinda Barrios, PA-C          Signed, Werner Lean, MD  03/20/2021 1:07 PM    [place]

## 2021-03-20 NOTE — Patient Instructions (Signed)
Medication Instructions:  Your physician recommends that you continue on your current medications as directed. Please refer to the Current Medication list given to you today.  *If you need a refill on your cardiac medications before your next appointment, please call your pharmacy*   Lab Work: TODAY: anemia panel and CBC If you have labs (blood work) drawn today and your tests are completely normal, you will receive your results only by: [name] MyChart Message (if you have MyChart) OR . A paper copy in the mail If you have any lab test that is abnormal or we need to change your treatment, we will call you to review the results.   Testing/Procedures: Your physician has requested that you have an echocardiogram. Echocardiography is a painless test that uses sound waves to create images of your heart. It provides your doctor with information about the size and shape of your heart and how well your heart's chambers and valves are working. This procedure takes approximately one hour. There are no restrictions for this procedure.    Follow-Up: At [facility], you and your health needs are our priority.  As part of our continuing mission to provide you with exceptional heart care, we have created designated Provider Care Teams.  These Care Teams include your primary Cardiologist (physician) and Advanced Practice Providers (APPs -  Physician Assistants and Nurse Practitioners) who all work together to provide you with the care you need, when you need it.  We recommend signing up for the patient portal called "MyChart".  Sign up information is provided on this After Visit Summary.  MyChart is used to connect with patients for Virtual Visits (Telemedicine).  Patients are able to view lab/test results, encounter notes, upcoming appointments, etc.  Non-urgent messages can be sent to your provider as well.   To learn more about what you can do with MyChart, go to [email or web address].    Your next  appointment:   6-7 month(s)  The format for your next appointment:   In Person  Provider:   You may see Rudean Haskell, MD or one of the following Advanced Practice Providers on your designated Care Team:    Melina Copa, PA-C  Ermalinda Barrios, PA-C

## 2021-03-21 LAB — IRON AND TIBC
Iron Saturation: 26 % (ref 15–55)
Iron: 82 ug/dL (ref 38–169)
Total Iron Binding Capacity: 312 ug/dL (ref 250–450)
UIBC: 230 ug/dL (ref 111–343)

## 2021-03-21 LAB — CBC
Hematocrit: 39.7 % (ref 37.5–51.0)
Hemoglobin: 13.1 g/dL (ref 13.0–17.7)
MCH: 29.6 pg (ref 26.6–33.0)
MCHC: 33 g/dL (ref 31.5–35.7)
MCV: 90 fL (ref 79–97)
Platelets: 141 10*3/uL — ABNORMAL LOW (ref 150–450)
RBC: 4.43 x10E6/uL (ref 4.14–5.80)
RDW: 14.3 % (ref 11.6–15.4)
WBC: 4.8 10*3/uL (ref 3.4–10.8)

## 2021-03-21 LAB — FERRITIN: Ferritin: 378 ng/mL (ref 30–400)

## 2021-04-06 DIAGNOSIS — Z23 Encounter for immunization: Secondary | ICD-10-CM | POA: Diagnosis not present

## 2021-04-17 DIAGNOSIS — C61 Malignant neoplasm of prostate: Secondary | ICD-10-CM | POA: Diagnosis not present

## 2021-04-17 DIAGNOSIS — D3000 Benign neoplasm of unspecified kidney: Secondary | ICD-10-CM | POA: Diagnosis not present

## 2021-04-18 ENCOUNTER — Other Ambulatory Visit (HOSPITAL_COMMUNITY): Payer: Medicare Other

## 2021-04-18 DIAGNOSIS — D3000 Benign neoplasm of unspecified kidney: Secondary | ICD-10-CM | POA: Diagnosis not present

## 2021-04-18 DIAGNOSIS — D3001 Benign neoplasm of right kidney: Secondary | ICD-10-CM | POA: Diagnosis not present

## 2021-04-18 DIAGNOSIS — N2889 Other specified disorders of kidney and ureter: Secondary | ICD-10-CM | POA: Diagnosis not present

## 2021-04-18 DIAGNOSIS — C641 Malignant neoplasm of right kidney, except renal pelvis: Secondary | ICD-10-CM | POA: Diagnosis not present

## 2021-04-18 DIAGNOSIS — K429 Umbilical hernia without obstruction or gangrene: Secondary | ICD-10-CM | POA: Diagnosis not present

## 2021-04-19 ENCOUNTER — Ambulatory Visit (HOSPITAL_COMMUNITY): Payer: Medicare Other | Attending: Cardiovascular Disease

## 2021-04-19 ENCOUNTER — Ambulatory Visit (INDEPENDENT_AMBULATORY_CARE_PROVIDER_SITE_OTHER): Payer: Medicare Other | Admitting: *Deleted

## 2021-04-19 ENCOUNTER — Other Ambulatory Visit: Payer: Self-pay

## 2021-04-19 DIAGNOSIS — I5020 Unspecified systolic (congestive) heart failure: Secondary | ICD-10-CM | POA: Diagnosis not present

## 2021-04-19 DIAGNOSIS — I4821 Permanent atrial fibrillation: Secondary | ICD-10-CM

## 2021-04-19 DIAGNOSIS — I080 Rheumatic disorders of both mitral and aortic valves: Secondary | ICD-10-CM | POA: Insufficient documentation

## 2021-04-19 DIAGNOSIS — Z7901 Long term (current) use of anticoagulants: Secondary | ICD-10-CM | POA: Diagnosis not present

## 2021-04-19 DIAGNOSIS — Z5181 Encounter for therapeutic drug level monitoring: Secondary | ICD-10-CM

## 2021-04-19 DIAGNOSIS — I071 Rheumatic tricuspid insufficiency: Secondary | ICD-10-CM | POA: Insufficient documentation

## 2021-04-19 LAB — ECHOCARDIOGRAM COMPLETE
Area-P 1/2: 1.67 cm2
MV M vel: 4.84 m/s
MV Peak grad: 93.6 mmHg
P 1/2 time: 456 msec
S' Lateral: 4.1 cm

## 2021-04-19 LAB — POCT INR: INR: 2.9 (ref 2.0–3.0)

## 2021-04-19 NOTE — Patient Instructions (Signed)
Description    - Continue same dosage 1 tablet daily except for 1.5 tablets on Sundays, Wednesdays and Fridays.  - Recheck in 6 weeks. Call us with any update or changes [phone].

## 2021-04-25 DIAGNOSIS — D3000 Benign neoplasm of unspecified kidney: Secondary | ICD-10-CM | POA: Diagnosis not present

## 2021-04-25 DIAGNOSIS — Z8546 Personal history of malignant neoplasm of prostate: Secondary | ICD-10-CM | POA: Diagnosis not present

## 2021-05-23 DIAGNOSIS — M67441 Ganglion, right hand: Secondary | ICD-10-CM | POA: Diagnosis not present

## 2021-05-23 DIAGNOSIS — K429 Umbilical hernia without obstruction or gangrene: Secondary | ICD-10-CM | POA: Diagnosis not present

## 2021-05-23 DIAGNOSIS — I119 Hypertensive heart disease without heart failure: Secondary | ICD-10-CM | POA: Diagnosis not present

## 2021-05-23 DIAGNOSIS — E663 Overweight: Secondary | ICD-10-CM | POA: Diagnosis not present

## 2021-05-23 DIAGNOSIS — I4821 Permanent atrial fibrillation: Secondary | ICD-10-CM | POA: Diagnosis not present

## 2021-05-23 DIAGNOSIS — L719 Rosacea, unspecified: Secondary | ICD-10-CM | POA: Diagnosis not present

## 2021-05-23 DIAGNOSIS — I10 Essential (primary) hypertension: Secondary | ICD-10-CM | POA: Diagnosis not present

## 2021-05-23 DIAGNOSIS — H00012 Hordeolum externum right lower eyelid: Secondary | ICD-10-CM | POA: Diagnosis not present

## 2021-05-23 DIAGNOSIS — Z8546 Personal history of malignant neoplasm of prostate: Secondary | ICD-10-CM | POA: Diagnosis not present

## 2021-05-31 ENCOUNTER — Ambulatory Visit (INDEPENDENT_AMBULATORY_CARE_PROVIDER_SITE_OTHER): Payer: Medicare Other

## 2021-05-31 ENCOUNTER — Other Ambulatory Visit: Payer: Self-pay

## 2021-05-31 DIAGNOSIS — I4821 Permanent atrial fibrillation: Secondary | ICD-10-CM | POA: Diagnosis not present

## 2021-05-31 DIAGNOSIS — Z7901 Long term (current) use of anticoagulants: Secondary | ICD-10-CM | POA: Diagnosis not present

## 2021-05-31 LAB — POCT INR: INR: 2.8 (ref 2.0–3.0)

## 2021-05-31 NOTE — Patient Instructions (Signed)
Description   Continue same dosage 1 tablet daily except for 1.5 tablets on Sundays, Wednesdays and Fridays. Recheck in 6 weeks. Call us with any update or changes [phone].

## 2021-06-03 ENCOUNTER — Other Ambulatory Visit: Payer: Self-pay | Admitting: Cardiovascular Disease

## 2021-07-12 ENCOUNTER — Other Ambulatory Visit: Payer: Self-pay

## 2021-07-12 ENCOUNTER — Ambulatory Visit (INDEPENDENT_AMBULATORY_CARE_PROVIDER_SITE_OTHER): Payer: Medicare Other | Admitting: *Deleted

## 2021-07-12 DIAGNOSIS — Z7901 Long term (current) use of anticoagulants: Secondary | ICD-10-CM

## 2021-07-12 DIAGNOSIS — I4821 Permanent atrial fibrillation: Secondary | ICD-10-CM | POA: Diagnosis not present

## 2021-07-12 DIAGNOSIS — Z5181 Encounter for therapeutic drug level monitoring: Secondary | ICD-10-CM

## 2021-07-12 LAB — POCT INR: INR: 2.5 (ref 2.0–3.0)

## 2021-07-12 NOTE — Patient Instructions (Signed)
Description   Continue same dosage 1 tablet daily except for 1.5 tablets on Sundays, Wednesdays and Fridays. Recheck in 6 weeks. Call us with any update or changes [phone].

## 2021-07-18 DIAGNOSIS — W57XXXA Bitten or stung by nonvenomous insect and other nonvenomous arthropods, initial encounter: Secondary | ICD-10-CM | POA: Diagnosis not present

## 2021-07-18 DIAGNOSIS — S90862A Insect bite (nonvenomous), left foot, initial encounter: Secondary | ICD-10-CM | POA: Diagnosis not present

## 2021-07-18 DIAGNOSIS — I10 Essential (primary) hypertension: Secondary | ICD-10-CM | POA: Diagnosis not present

## 2021-07-18 DIAGNOSIS — M109 Gout, unspecified: Secondary | ICD-10-CM | POA: Diagnosis not present

## 2021-07-19 DIAGNOSIS — L821 Other seborrheic keratosis: Secondary | ICD-10-CM | POA: Diagnosis not present

## 2021-07-19 DIAGNOSIS — L858 Other specified epidermal thickening: Secondary | ICD-10-CM | POA: Diagnosis not present

## 2021-07-19 DIAGNOSIS — D1801 Hemangioma of skin and subcutaneous tissue: Secondary | ICD-10-CM | POA: Diagnosis not present

## 2021-07-19 DIAGNOSIS — L570 Actinic keratosis: Secondary | ICD-10-CM | POA: Diagnosis not present

## 2021-07-19 DIAGNOSIS — L812 Freckles: Secondary | ICD-10-CM | POA: Diagnosis not present

## 2021-07-19 DIAGNOSIS — Z85828 Personal history of other malignant neoplasm of skin: Secondary | ICD-10-CM | POA: Diagnosis not present

## 2021-07-20 DIAGNOSIS — I10 Essential (primary) hypertension: Secondary | ICD-10-CM | POA: Diagnosis not present

## 2021-07-20 DIAGNOSIS — M109 Gout, unspecified: Secondary | ICD-10-CM | POA: Diagnosis not present

## 2021-07-20 DIAGNOSIS — L0390 Cellulitis, unspecified: Secondary | ICD-10-CM | POA: Diagnosis not present

## 2021-07-20 DIAGNOSIS — W57XXXS Bitten or stung by nonvenomous insect and other nonvenomous arthropods, sequela: Secondary | ICD-10-CM | POA: Diagnosis not present

## 2021-07-20 DIAGNOSIS — W57XXXA Bitten or stung by nonvenomous insect and other nonvenomous arthropods, initial encounter: Secondary | ICD-10-CM | POA: Diagnosis not present

## 2021-07-20 DIAGNOSIS — S70362A Insect bite (nonvenomous), left thigh, initial encounter: Secondary | ICD-10-CM | POA: Diagnosis not present

## 2021-08-23 ENCOUNTER — Ambulatory Visit (INDEPENDENT_AMBULATORY_CARE_PROVIDER_SITE_OTHER): Payer: Medicare Other | Admitting: *Deleted

## 2021-08-23 ENCOUNTER — Other Ambulatory Visit: Payer: Self-pay

## 2021-08-23 DIAGNOSIS — Z7901 Long term (current) use of anticoagulants: Secondary | ICD-10-CM | POA: Diagnosis not present

## 2021-08-23 DIAGNOSIS — I4821 Permanent atrial fibrillation: Secondary | ICD-10-CM

## 2021-08-23 LAB — POCT INR: INR: 4.4 — AB (ref 2.0–3.0)

## 2021-08-23 NOTE — Patient Instructions (Addendum)
Description   Since you have taken today's dose, do not take any Warfarin tomorrow and No Warfarin on Friday then continue same dosage 1 tablet daily except for 1.5 tablets on Sundays, Wednesdays and Fridays. Recheck in 3 weeks (normally 6 weeks). Call us with any update or changes [phone].

## 2021-08-31 ENCOUNTER — Other Ambulatory Visit: Payer: Self-pay | Admitting: Cardiovascular Disease

## 2021-08-31 NOTE — Telephone Encounter (Signed)
Prescription refill request received for warfarin Lov: 03/20/21 Daniel Love) Next INR check: 09/12/21 Warfarin tablet strength: '5mg'$   Appropriate dose and refill sent to requested pharmacy.

## 2021-09-12 ENCOUNTER — Other Ambulatory Visit: Payer: Self-pay

## 2021-09-12 ENCOUNTER — Ambulatory Visit (INDEPENDENT_AMBULATORY_CARE_PROVIDER_SITE_OTHER): Payer: Medicare Other | Admitting: *Deleted

## 2021-09-12 DIAGNOSIS — I4821 Permanent atrial fibrillation: Secondary | ICD-10-CM | POA: Diagnosis not present

## 2021-09-12 DIAGNOSIS — Z7901 Long term (current) use of anticoagulants: Secondary | ICD-10-CM

## 2021-09-12 LAB — POCT INR: INR: 2.7 (ref 2.0–3.0)

## 2021-09-12 NOTE — Patient Instructions (Signed)
Description   Continue taking Warfarin 1 tablet daily except for 1.5 tablets on Sundays, Wednesdays and Fridays. Recheck in 5 weeks. Call us with any update or changes [phone].

## 2021-09-27 ENCOUNTER — Ambulatory Visit (INDEPENDENT_AMBULATORY_CARE_PROVIDER_SITE_OTHER): Payer: Medicare Other | Admitting: Internal Medicine

## 2021-09-27 ENCOUNTER — Other Ambulatory Visit: Payer: Self-pay

## 2021-09-27 ENCOUNTER — Encounter: Payer: Self-pay | Admitting: Internal Medicine

## 2021-09-27 VITALS — BP 120/70 | HR 57 | Ht 71.0 in | Wt 190.0 lb

## 2021-09-27 DIAGNOSIS — I080 Rheumatic disorders of both mitral and aortic valves: Secondary | ICD-10-CM

## 2021-09-27 DIAGNOSIS — I4821 Permanent atrial fibrillation: Secondary | ICD-10-CM

## 2021-09-27 DIAGNOSIS — I340 Nonrheumatic mitral (valve) insufficiency: Secondary | ICD-10-CM

## 2021-09-27 NOTE — Patient Instructions (Signed)
Medication Instructions:  Your physician recommends that you continue on your current medications as directed. Please refer to the Current Medication list given to you today.  *If you need a refill on your cardiac medications before your next appointment, please call your pharmacy*   Lab Work: NONE If you have labs (blood work) drawn today and your tests are completely normal, you will receive your results only by: [place] (if you have MyChart) OR A paper copy in the mail If you have any lab test that is abnormal or we need to change your treatment, we will call you to review the results.   Testing/Procedures: Your physician has requested that you have an echocardiogram in April 2023. Echocardiography is a painless test that uses sound waves to create images of your heart. It provides your doctor with information about the size and shape of your heart and how well your heart's chambers and valves are working. This procedure takes approximately one hour. There are no restrictions for this procedure.    Follow-Up: At [facility], you and your health needs are our priority.  As part of our continuing mission to provide you with exceptional heart care, we have created designated Provider Care Teams.  These Care Teams include your primary Cardiologist (physician) and Advanced Practice Providers (APPs -  Physician Assistants and Nurse Practitioners) who all work together to provide you with the care you need, when you need it.  We recommend signing up for the patient portal called "MyChart".  Sign up information is provided on this After Visit Summary.  MyChart is used to connect with patients for Virtual Visits (Telemedicine).  Patients are able to view lab/test results, encounter notes, upcoming appointments, etc.  Non-urgent messages can be sent to your provider as well.   To learn more about what you can do with MyChart, go to [email or web address].    Your next appointment:   8  month(s)  The format for your next appointment:   In Person  Provider:   You may see Rudean Haskell, MD or one of the following Advanced Practice Providers on your designated Care Team:   Melina Copa, PA-C Ermalinda Barrios, PA-C

## 2021-09-27 NOTE — Progress Notes (Signed)
Cardiology Office Note:    Date:  09/27/2021   ID:  Daniel Love, DOB 09/02/1940, MRN [id number]  PCP:  Chesley Noon, MD   [place]  Cardiologist:   Rudean Haskell MD Advanced Practice Provider:  No care team member to display Electrophysiologist:  None   Referring MD: Chesley Noon, MD   CC:  Follow up MR.  History of Present Illness:    Daniel Love is a 81 y.o. male with a hx of HTN Permanent atrial fibrillation with moderate atrial functional MR, Moderate aortic aneurysm (4.5 cm-> Ascending Aortic Heigh Index) seen by [name] who presented to establish care.  Since last vist he had an echo with improved LVEF and stable Aortic size.  Seen 09/27/21  Patient notes that he is doing well.  Since last visit notes no changes.  Takes care of his wife who has dementia.   There are no interval hospital/ED visit.    No chest pain or pressure.  No SOB/DOE and no PND/Orthopnea.  Has gained a couple of pounds due to dietary indiscretion but not leg swelling.  No palpitations or syncope .Has minimal leg swelling and no longer needs his compression stockings.  Ambulatory blood pressure controlled 120/70s on his medications.   Past Medical History:  Diagnosis Date   Anal fissure    hx of    Atrial fibrillation ([place])    maintaining normal sinus rhythm   Chronic anticoagulation    coumadin   Dyspepsia    GERD (gastroesophageal reflux disease)    Glaucoma    History of colon polyps 2012   Colonoscopy-Dr. Benson [place]    History of echocardiogram    Echocardiogram 2/19: EF 60-65, normal wall motion, trivial AI, moderate to severe MR, mild LAE, mild RAE, mild to moderate TR, PASP 35   History of kidney stones    History of nuclear stress test    Myoview 2/19: EF 42, diffuse HK no significant ischemia, apical lateral scar   Hypertension    Prostate cancer ([place])    Skin cancer    skin cancer removed from left arm and back    Past Surgical  History:  Procedure Laterality Date   BACK SURGERY  12/31/1977   for degenerative disc disease   COLONOSCOPY     PROSTATE BIOPSY     TEE WITHOUT CARDIOVERSION N/A 03/04/2019   Procedure: TRANSESOPHAGEAL ECHOCARDIOGRAM (TEE);  Surgeon: Thayer Headings, MD;  [facility] ENDOSCOPY;  Service: Cardiovascular;  Laterality: N/A;   UPPER GASTROINTESTINAL ENDOSCOPY      Current Medications: Current Meds  Medication Sig   calcium carbonate (OSCAL) 1500 (600 Ca) MG TABS tablet Take 1,500 mg by mouth daily.   clotrimazole-betamethasone (LOTRISONE) cream Apply 1 application topically 2 (two) times daily as needed (yeast infection).   colchicine 0.6 MG tablet Take 0.6 mg by mouth as needed. Gout flareups   diphenhydramine-acetaminophen (TYLENOL PM) 25-500 MG TABS tablet Take 1 tablet by mouth at bedtime as needed (sleep).   esomeprazole (NEXIUM) 20 MG capsule Take 20 mg by mouth daily as needed (acid reflux).   furosemide (LASIX) 20 MG tablet Take 1 tablet (20 mg total) by mouth daily as needed.   isosorbide mononitrate (IMDUR) 30 MG 24 hr tablet Take 30 mg by mouth daily.    LUMIGAN 0.01 % SOLN Place 1 drop into both eyes daily.   Menthol, Topical Analgesic, (ICY HOT BACK EX) Apply 1 application topically daily as needed (pain).  metroNIDAZOLE (METROGEL) 1 % gel Apply topically as needed. Skin irritation - rosea   Multiple Vitamins-Minerals (CENTRUM SILVER PO) Take 1 tablet by mouth every morning.   nystatin cream (MYCOSTATIN) Apply 1 application topically as needed (RASH).    olmesartan-hydrochlorothiazide (BENICAR HCT) 40-25 MG tablet TAKE ONE TABLET BY MOUTH DAILY   oxymetazoline (AFRIN) 0.05 % nasal spray Place 1 spray into both nostrils at bedtime as needed for congestion.   timolol (TIMOPTIC) 0.5 % ophthalmic solution daily. 1 drop in both eyes   warfarin (COUMADIN) 5 MG tablet TAKE 1 TO 1 AND 1/2 TABLET BY MOUTH DAILY AS INSTRUCTED BY COUMADIN CLINIC     Allergies:   Amlodipine and  Lisinopril   Social History   Socioeconomic History   Marital status: Married    Spouse name: Not on file   Number of children: 3   Years of education: Not on file   Highest education level: Not on file  Occupational History   Occupation: Retired  Tobacco Use   Smoking status: Former    Packs/day: 1.00    Years: 7.00    Pack years: 7.00    Types: Cigarettes    Quit date: 12/31/1966    Years since quitting: 54.7   Smokeless tobacco: Never  Vaping Use   Vaping Use: Never used  Substance and Sexual Activity   Alcohol use: Yes    Alcohol/week: 7.0 standard drinks    Types: 7 Glasses of wine per week    Comment: 1 daily    Drug use: No   Sexual activity: Not Currently  Other Topics Concern   Not on file  Social History Narrative   Patient is the primary care giver for his wife who is disabled. They share three daughters, two live local, one in [place]   09-26-18 Unable to ask abuse questions wife and daughter with him today.   Social Determinants of Health   Financial Resource Strain: Not on file  Food Insecurity: Not on file  Transportation Needs: Not on file  Physical Activity: Not on file  Stress: Not on file  Social Connections: Not on file    Social: married and his wife has dementia  Family History: The patient's family history includes Cancer in his father and mother; Colon cancer in his maternal grandmother; Heart attack in his father; Heart failure in his father. There is no history of Stroke, Esophageal cancer, Rectal cancer, or Stomach cancer.  ROS:   Please see the history of present illness.     All other systems reviewed and are negative.  EKGs/Labs/Other Studies Reviewed:    The following studies were reviewed today:  EKG:   09/27/21: AF rate 57 SVR 09/19/20:  AF rate 54 with slow ventricular response  Transthoracic Echocardiogram: Date: 04/19/21 Results:  1. Left ventricular ejection fraction, by estimation, is 50 to 55%. The  left ventricle  has low normal function. The left ventricle demonstrates  global hypokinesis. Left ventricular diastolic function could not be  evaluated.   2. Right ventricular systolic function is mildly reduced. The right  ventricular size is moderately enlarged. There is moderately elevated  pulmonary artery systolic pressure. The estimated right ventricular  systolic pressure is 41.9 mmHg.   3. Left atrial size was severely dilated.   4. Right atrial size was severely dilated.   5. The mitral valve is myxomatous. Moderate mitral valve regurgitation.  There is mild late systolic prolapse of both leaflets of the mitral valve.   6. The  tricuspid valve is myxomatous. Tricuspid valve regurgitation is  moderate.   7. The aortic valve is tricuspid. Aortic valve regurgitation is mild.  Mild aortic valve sclerosis is present, with no evidence of aortic valve  stenosis.   8. Aortic dilatation noted. There is mild dilatation of the aortic root,  measuring 44 mm. There is mild dilatation of the ascending aorta,  measuring 43 mm.   9. The inferior vena cava is dilated in size with <50% respiratory  variability, suggesting right atrial pressure of 15 mmHg.   Transesophageal Echocardiogram: Date: 03/04/2019 Results:  1. The left ventricle has mild-moderately reduced systolic function, with  an ejection fraction of 40-45%. The cavity size was normal. Left  ventricular diffuse hypokinesis.   2. The right ventricle has normal systolc function. The cavity was  normal. There is no increase in right ventricular wall thickness.   3. Left atrial size was severely dilated.   4. 6.1cm left atrial diameter in 4 chamber view.   5. Right atrial size was severely dilated.   6. The mitral valve is myxomatous. Moderate thickening of the posterior  mitral valve leaflet. Mitral valve regurgitation is moderate by color flow  Doppler.        8. The tricuspid valve was myxomatous. Tricuspid valve regurgitation is   moderate-severe.   9. The aortic valve is tricuspid Mild thickening of the aortic valve Mild  calcification of the aortic valve. Aortic valve regurgitation is mild by  color flow Doppler. The jet is posteriorly-directed.  10. The pulmonic valve was normal in structure.  11. There is dilatation of the ascending aorta measuring 41 mm.  12. Pulmonary hypertension is mild.   CT Aorta: Date:02/22/2021 Results: IMPRESSION: 1. Stable prominence of the ascending thoracic aorta measuring 4.5 x 4.5 cm. Other thoracic aortic measurements as noted. No evident dissection. Ascending thoracic aortic aneurysm. Recommend semi-annual imaging followup by CTA or MRA and referral to cardiothoracic surgery if not already obtained. This recommendation follows 2010 ACCF/AHA/AATS/ACR/ASA/SCA/SCAI/SIR/STS/SVM Guidelines for the Diagnosis and Management of Patients With Thoracic Aortic Disease. Circulation. 2010; 121: G315-V761. Aortic aneurysm NOS (ICD10-I71.9). Scattered foci of coronary artery calcification noted.   2.  No evident pulmonary embolus.   3. Occasional calcified granulomas. Areas of atelectatic change, stable. No edema or airspace opacity.   4.  No evident adenopathy.  NM Stress Testing : Date:02/17/2018 Results: Nuclear stress EF: 42%. There was no ST segment deviation noted during stress. This is an intermediate risk study. The left ventricular ejection fraction is moderately decreased (30-44%).   1. EF 42%, diffuse hypokinesis.  2. Fixed small, mild apical lateral perfusion defect.  Possible prior infarction, no evidence for significant ischemia.     Recent Labs: 03/20/2021: Hemoglobin 13.1; Platelets 141  Recent Lipid Panel No results found for: CHOL, TRIG, HDL, CHOLHDL, VLDL, LDLCALC, LDLDIRECT   Risk Assessment/Calculations:    CHA2DS2-VASc Score = 4  This indicates a 4.8% annual risk of stroke. The patient's score is based upon: CHF History: 1 HTN History:  1 Diabetes History: 0 Stroke History: 0 Vascular Disease History: 0 Age Score: 2 Gender Score: 0      Physical Exam:    VS:  BP 120/70   Pulse (!) 57   Ht 5\' 11"  (1.803 m)   Wt 190 lb (86.2 kg)   SpO2 98%   BMI 26.50 kg/m     Wt Readings from Last 3 Encounters:  09/27/21 190 lb (86.2 kg)  03/20/21 187 lb 12.8 oz (85.2  kg)  02/22/21 189 lb (85.7 kg)    GEN: Elderly Male well developed in no acute distress HEENT: Normal NECK: No JVD LYMPHATICS: No lymphadenopathy CARDIAC: Rate bradycardia, holosystolic murmur best heard at LLSB RESPIRATORY:  Clear to auscultation without rales, wheezing or rhonchi  ABDOMEN: Soft, non-tender, non-distended MUSCULOSKELETAL:  minimal bilateral edema; No deformity  SKIN: Warm and dry NEUROLOGIC:  Alert and oriented x 3 PSYCHIATRIC:  Normal affect   ASSESSMENT:    1. Mitral regurgitation and aortic stenosis   2. Permanent atrial fibrillation ([place])   3. Mitral valve insufficiency, unspecified etiology     PLAN:    In order of problems listed above:  Moderate Mitral Regurgitation- Atrial Functional MR Moderate Tricuspid Regurgitation HFmrEF - NYHA class I, Stage B, euvolemic, etiology from AF though no prior ischemic tseting - Diuretic regimen: lasix 20 mg PO Daily and 25 mg HCTZ combination; has worked well - discussed indications to return to compression stockings - Likely would not tolerate BB  - Continue Olmesartan 40 mg - continue Imdur  Permanent Atrial Fibrillation - Risk factors include Age X2 and HF, HTN - CHADSVASC=4.  - Continue anticoagulation with coumadin.  - asymptomatic  Asymptomatic thoracic aortic aneurysm - Last at 4.5 - Last scan 02/22/21-> planned for CT Aorta with [name] 2024 unless significant differences in next echo  April-May 2023 f/u        Medication Adjustments/Labs and Tests Ordered: Current medicines are reviewed at length with the patient today.  Concerns regarding medicines are  outlined above.  Orders Placed This Encounter  Procedures   EKG 12-Lead   ECHOCARDIOGRAM COMPLETE    No orders of the defined types were placed in this encounter.   Patient Instructions  Medication Instructions:  Your physician recommends that you continue on your current medications as directed. Please refer to the Current Medication list given to you today.  *If you need a refill on your cardiac medications before your next appointment, please call your pharmacy*   Lab Work: NONE If you have labs (blood work) drawn today and your tests are completely normal, you will receive your results only by: [place] (if you have MyChart) OR A paper copy in the mail If you have any lab test that is abnormal or we need to change your treatment, we will call you to review the results.   Testing/Procedures: Your physician has requested that you have an echocardiogram in April 2023. Echocardiography is a painless test that uses sound waves to create images of your heart. It provides your doctor with information about the size and shape of your heart and how well your heart's chambers and valves are working. This procedure takes approximately one hour. There are no restrictions for this procedure.    Follow-Up: At [facility], you and your health needs are our priority.  As part of our continuing mission to provide you with exceptional heart care, we have created designated Provider Care Teams.  These Care Teams include your primary Cardiologist (physician) and Advanced Practice Providers (APPs -  Physician Assistants and Nurse Practitioners) who all work together to provide you with the care you need, when you need it.  We recommend signing up for the patient portal called "MyChart".  Sign up information is provided on this After Visit Summary.  MyChart is used to connect with patients for Virtual Visits (Telemedicine).  Patients are able to view lab/test results, encounter notes,  upcoming appointments, etc.  Non-urgent messages can be sent  to your provider as well.   To learn more about what you can do with MyChart, go to [email or web address].    Your next appointment:   8 month(s)  The format for your next appointment:   In Person  Provider:   You may see Rudean Haskell, MD or one of the following Advanced Practice Providers on your designated Care Team:   Melina Copa, PA-C Ermalinda Barrios, PA-C        Signed, Werner Lean, MD  09/27/2021 11:36 AM    [place]

## 2021-10-02 ENCOUNTER — Other Ambulatory Visit: Payer: Self-pay | Admitting: Cardiovascular Disease

## 2021-10-05 DIAGNOSIS — Z23 Encounter for immunization: Secondary | ICD-10-CM | POA: Diagnosis not present

## 2021-10-17 DIAGNOSIS — Z8546 Personal history of malignant neoplasm of prostate: Secondary | ICD-10-CM | POA: Diagnosis not present

## 2021-10-18 ENCOUNTER — Other Ambulatory Visit: Payer: Self-pay

## 2021-10-18 ENCOUNTER — Ambulatory Visit (INDEPENDENT_AMBULATORY_CARE_PROVIDER_SITE_OTHER): Payer: Medicare Other | Admitting: *Deleted

## 2021-10-18 DIAGNOSIS — Z7901 Long term (current) use of anticoagulants: Secondary | ICD-10-CM | POA: Diagnosis not present

## 2021-10-18 DIAGNOSIS — I4821 Permanent atrial fibrillation: Secondary | ICD-10-CM

## 2021-10-18 LAB — POCT INR: INR: 4 — AB (ref 2.0–3.0)

## 2021-10-18 NOTE — Patient Instructions (Signed)
Description   Do not take any Warfarin tomorrow and Friday take 1 tablets then continue taking Warfarin 1 tablet daily except for 1.5 tablets on Sundays, Wednesdays and Fridays. Recheck in 3 weeks (normally 5 weeks). Call us with any update or changes [phone].

## 2021-10-19 DIAGNOSIS — K429 Umbilical hernia without obstruction or gangrene: Secondary | ICD-10-CM | POA: Diagnosis not present

## 2021-10-19 DIAGNOSIS — Z8546 Personal history of malignant neoplasm of prostate: Secondary | ICD-10-CM | POA: Diagnosis not present

## 2021-10-19 DIAGNOSIS — Z85820 Personal history of malignant melanoma of skin: Secondary | ICD-10-CM | POA: Diagnosis not present

## 2021-10-19 DIAGNOSIS — D3001 Benign neoplasm of right kidney: Secondary | ICD-10-CM | POA: Diagnosis not present

## 2021-10-19 DIAGNOSIS — N2889 Other specified disorders of kidney and ureter: Secondary | ICD-10-CM | POA: Diagnosis not present

## 2021-10-24 DIAGNOSIS — D3000 Benign neoplasm of unspecified kidney: Secondary | ICD-10-CM | POA: Diagnosis not present

## 2021-10-24 DIAGNOSIS — Z8546 Personal history of malignant neoplasm of prostate: Secondary | ICD-10-CM | POA: Diagnosis not present

## 2021-11-08 ENCOUNTER — Ambulatory Visit (INDEPENDENT_AMBULATORY_CARE_PROVIDER_SITE_OTHER): Payer: Medicare Other | Admitting: *Deleted

## 2021-11-08 ENCOUNTER — Other Ambulatory Visit: Payer: Self-pay

## 2021-11-08 DIAGNOSIS — Z7901 Long term (current) use of anticoagulants: Secondary | ICD-10-CM

## 2021-11-08 DIAGNOSIS — I4821 Permanent atrial fibrillation: Secondary | ICD-10-CM | POA: Diagnosis not present

## 2021-11-08 LAB — POCT INR: INR: 3.7 — AB (ref 2.0–3.0)

## 2021-11-08 NOTE — Patient Instructions (Addendum)
Description   Do not take any Warfarin tomorrow (already taken today's dose) then change your dose to Warfarin 1 tablet daily except for 1.5 tablets on Sundays and Wednesdays. Recheck in 3 weeks (normally 5 weeks). Call us with any update or changes [phone].

## 2021-11-27 DIAGNOSIS — R350 Frequency of micturition: Secondary | ICD-10-CM | POA: Diagnosis not present

## 2021-11-27 DIAGNOSIS — I4821 Permanent atrial fibrillation: Secondary | ICD-10-CM | POA: Diagnosis not present

## 2021-11-27 DIAGNOSIS — I10 Essential (primary) hypertension: Secondary | ICD-10-CM | POA: Diagnosis not present

## 2021-11-27 DIAGNOSIS — M109 Gout, unspecified: Secondary | ICD-10-CM | POA: Diagnosis not present

## 2021-11-27 DIAGNOSIS — Z0000 Encounter for general adult medical examination without abnormal findings: Secondary | ICD-10-CM | POA: Diagnosis not present

## 2021-11-27 DIAGNOSIS — Z125 Encounter for screening for malignant neoplasm of prostate: Secondary | ICD-10-CM | POA: Diagnosis not present

## 2021-11-29 ENCOUNTER — Other Ambulatory Visit: Payer: Self-pay

## 2021-11-29 ENCOUNTER — Ambulatory Visit (INDEPENDENT_AMBULATORY_CARE_PROVIDER_SITE_OTHER): Payer: Medicare Other | Admitting: *Deleted

## 2021-11-29 DIAGNOSIS — Z7901 Long term (current) use of anticoagulants: Secondary | ICD-10-CM

## 2021-11-29 DIAGNOSIS — I4821 Permanent atrial fibrillation: Secondary | ICD-10-CM

## 2021-11-29 LAB — POCT INR: INR: 3.7 — AB (ref 2.0–3.0)

## 2021-11-29 NOTE — Patient Instructions (Signed)
Description   Do not take any Warfarin tomorrow (already taken today's dose) then start taking Warfarin 1 tablet daily except for 1.5 tablets on Wednesdays. Recheck in 3 weeks (normally 5 weeks). Call us with any update or changes [phone].

## 2021-12-15 ENCOUNTER — Other Ambulatory Visit: Payer: Self-pay | Admitting: Urology

## 2021-12-15 DIAGNOSIS — N2889 Other specified disorders of kidney and ureter: Secondary | ICD-10-CM

## 2021-12-28 ENCOUNTER — Other Ambulatory Visit: Payer: Self-pay

## 2021-12-28 ENCOUNTER — Ambulatory Visit (INDEPENDENT_AMBULATORY_CARE_PROVIDER_SITE_OTHER): Payer: Medicare Other

## 2021-12-28 DIAGNOSIS — I4821 Permanent atrial fibrillation: Secondary | ICD-10-CM

## 2021-12-28 DIAGNOSIS — Z7901 Long term (current) use of anticoagulants: Secondary | ICD-10-CM | POA: Diagnosis not present

## 2021-12-28 LAB — POCT INR: INR: 3.4 — AB (ref 2.0–3.0)

## 2021-12-28 NOTE — Patient Instructions (Signed)
Description   Do not take any Warfarin tomorrow (already taken today's dose) then start taking Warfarin 1 tablet daily. Recheck in 3 weeks (normally 5 weeks). Call us with any update or changes [phone].

## 2022-01-11 ENCOUNTER — Other Ambulatory Visit: Payer: Self-pay

## 2022-01-11 ENCOUNTER — Ambulatory Visit (INDEPENDENT_AMBULATORY_CARE_PROVIDER_SITE_OTHER): Payer: Medicare Other | Admitting: *Deleted

## 2022-01-11 DIAGNOSIS — I4821 Permanent atrial fibrillation: Secondary | ICD-10-CM

## 2022-01-11 DIAGNOSIS — Z7901 Long term (current) use of anticoagulants: Secondary | ICD-10-CM | POA: Diagnosis not present

## 2022-01-11 LAB — POCT INR: INR: 2 (ref 2.0–3.0)

## 2022-01-11 NOTE — Patient Instructions (Signed)
Description   Today take 1.5 tablets then continue taking Warfarin 1 tablet daily. Recheck in 3 weeks (normally 5 weeks). Call us with any update or changes [phone].

## 2022-01-16 ENCOUNTER — Encounter: Payer: Self-pay | Admitting: *Deleted

## 2022-01-16 ENCOUNTER — Ambulatory Visit
Admission: RE | Admit: 2022-01-16 | Discharge: 2022-01-16 | Disposition: A | Discharge status: Home / Self Care | Payer: Medicare Other | Source: Ambulatory Visit | Attending: Urology | Admitting: Urology

## 2022-01-16 DIAGNOSIS — N2889 Other specified disorders of kidney and ureter: Secondary | ICD-10-CM

## 2022-01-16 HISTORY — PX: IR RADIOLOGIST EVAL & MGMT: IMG5224

## 2022-01-16 NOTE — Consult Note (Signed)
Chief Complaint: Patient was seen in consultation today for an enlarging right renal mass at the request of Herrick,Benjamin W  Referring Physician(s): Herrick,Benjamin W  History of Present Illness: Daniel Love is a 82 y.o. male with a history of a known right renal mass dating back to initial detection by CT on 12/09/2018 with demonstration at that time of a roughly 2.4 x 2.0 cm solid mass in the anterior and superior aspect of the right kidney with exophytic component.  MRI characterization on 03/10/2019 demonstrated a roughly 2.5 x 2.3 x 2.1 cm homogeneous solid lesion demonstrating diffuse enhancement with imaging characteristics more likely consistent with a low-grade carcinoma.  Follow-up imaging was performed in 2020, 2021 and 2022 all demonstrating a very slow growth of the lesion.  The most recent CT is dated 10/19/2021 with maximum tumor dimensions by my measurements of approximately 2.8 x 2.3 x 2.6 cm.  Daniel Love is asymptomatic with respect to the mass and denies any flank pain, abdominal pain, hematuria or other urinary symptoms.  He has been successfully treated for prostate carcinoma.  He is anticoagulated chronically with Coumadin for chronic atrial fibrillation and is followed by Dr. Gasper Sells.  He has a history of aneurysmal disease of the descending thoracic aorta which is asymptomatic and followed by [name]. He lives in a home with his wife.  His wife has moderate dementia and a caretaker that helps her at home.  They have 3 daughters.   Past Medical History:  Diagnosis Date   Anal fissure    hx of    Atrial fibrillation ([place])    maintaining normal sinus rhythm   Chronic anticoagulation    coumadin   Dyspepsia    GERD (gastroesophageal reflux disease)    Glaucoma    History of colon polyps 2012   Colonoscopy-Dr. Benson [place]    History of echocardiogram    Echocardiogram 2/19: EF 60-65, normal wall motion, trivial AI, moderate to severe MR, mild LAE, mild  RAE, mild to moderate TR, PASP 35   History of kidney stones    History of nuclear stress test    Myoview 2/19: EF 42, diffuse HK no significant ischemia, apical lateral scar   Hypertension    Prostate cancer ([place])    Skin cancer    skin cancer removed from left arm and back    Past Surgical History:  Procedure Laterality Date   BACK SURGERY  12/31/1977   for degenerative disc disease   COLONOSCOPY     IR RADIOLOGIST EVAL & MGMT  01/16/2022   PROSTATE BIOPSY     TEE WITHOUT CARDIOVERSION N/A 03/04/2019   Procedure: TRANSESOPHAGEAL ECHOCARDIOGRAM (TEE);  Surgeon: Acie Fredrickson Wonda Cheng, MD;  Location: [facility] ENDOSCOPY;  Service: Cardiovascular;  Laterality: N/A;   UPPER GASTROINTESTINAL ENDOSCOPY      Allergies: Amlodipine and Lisinopril  Medications: Prior to Admission medications   Medication Sig Start Date End Date Taking? Authorizing Provider  calcium carbonate (OSCAL) 1500 (600 Ca) MG TABS tablet Take 1,500 mg by mouth daily.    [provider]  clotrimazole-betamethasone (LOTRISONE) cream Apply 1 application topically 2 (two) times daily as needed (yeast infection).    [provider]  colchicine 0.6 MG tablet Take 0.6 mg by mouth as needed. Gout flareups 07/30/21   [provider]  diphenhydramine-acetaminophen (TYLENOL PM) 25-500 MG TABS tablet Take 1 tablet by mouth at bedtime as needed (sleep).    [provider]  esomeprazole (Dexter)  20 MG capsule Take 20 mg by mouth daily as needed (acid reflux).    [provider]  furosemide (LASIX) 20 MG tablet TAKE ONE TABLET BY MOUTH DAILY AS NEEDED 10/02/21   Chandrasekhar, [name] A, MD  Glucosamine-Chondroitin (OSTEO BI-FLEX REGULAR STRENGTH PO) Take 2 tablets by mouth daily.    [provider]  isosorbide mononitrate (IMDUR) 30 MG 24 hr tablet Take 30 mg by mouth daily.  10/13/18   [provider]  LUMIGAN 0.01 % SOLN Place 1 drop into both eyes daily. 06/13/16   [provider]  Menthol, Topical Analgesic, (ICY HOT BACK EX) Apply 1 application topically daily as needed (pain).    [provider]  metroNIDAZOLE (METROGEL) 1 % gel Apply topically as needed. Skin irritation - rosea 05/23/21 05/23/22  [provider]  Multiple Vitamins-Minerals (CENTRUM SILVER PO) Take 1 tablet by mouth every morning.    [provider]  nystatin cream (MYCOSTATIN) Apply 1 application topically as needed (RASH).  11/20/11   [provider]  olmesartan-hydrochlorothiazide (BENICAR HCT) 40-25 MG tablet TAKE ONE TABLET BY MOUTH DAILY 06/05/21   Nahser, Wonda Cheng, MD  oxymetazoline (AFRIN) 0.05 % nasal spray Place 1 spray into both nostrils at bedtime as needed for congestion.    [provider]  timolol (TIMOPTIC) 0.5 % ophthalmic solution daily. 1 drop in both eyes 03/07/20   [provider]  warfarin (COUMADIN) 5 MG tablet TAKE 1 TO 1 AND 1/2 TABLET BY MOUTH DAILY AS INSTRUCTED BY COUMADIN CLINIC 08/31/21   Werner Lean, MD     Family History  Problem Relation Age of Onset   Heart failure Father    Cancer Father        associated with her lungs   Heart attack Father    Cancer Mother        uncertain/possibly lung   Colon cancer Maternal Grandmother    Stroke Neg Hx    Esophageal cancer Neg Hx    Rectal cancer Neg Hx    Stomach cancer Neg Hx     Social History   Socioeconomic History   Marital status: Married    Spouse name: Not on file   Number of children: 3   Years of education: Not on file   Highest education level: Not on file  Occupational History   Occupation: Retired  Tobacco Use   Smoking status: Former    Packs/day: 1.00    Years: 7.00    Pack years: 7.00    Types: Cigarettes    Quit date: 12/31/1966    Years since quitting: 55.0   Smokeless tobacco: Never  Vaping Use   Vaping Use: Never used  Substance and Sexual Activity   Alcohol use: Yes    Alcohol/week: 7.0 standard drinks     Types: 7 Glasses of wine per week    Comment: 1 daily    Drug use: No   Sexual activity: Not Currently  Other Topics Concern   Not on file  Social History Narrative   Patient is the primary care giver for his wife who is disabled. They share three daughters, two live local, one in [place]   09-26-18 Unable to ask abuse questions wife and daughter with him today.   Social Determinants of Health   Financial Resource Strain: Not on file  Food Insecurity: Not on file  Transportation Needs: Not on file  Physical Activity: Not on file  Stress: Not on file  Social  Connections: Not on file    ECOG Status: 0 - Asymptomatic  Review of Systems: A 12 point ROS discussed and pertinent positives are indicated in the HPI above.  All other systems are negative.  Review of Systems  Constitutional: Negative.   Respiratory: Negative.    Cardiovascular:  Positive for leg swelling. Negative for chest pain and palpitations.       Mild edema of right lower leg.  Gastrointestinal: Negative.   Genitourinary: Negative.   Musculoskeletal: Negative.   Neurological: Negative.    Vital Signs: BP 133/74 (BP Location: Left Arm)    Pulse 75    SpO2 98%   Physical Exam Vitals reviewed.  Constitutional:      General: He is not in acute distress.    Appearance: Normal appearance. He is not ill-appearing, toxic-appearing or diaphoretic.  Cardiovascular:     Rate and Rhythm: Normal rate and regular rhythm.  Pulmonary:     Effort: Pulmonary effort is normal. No respiratory distress.     Breath sounds: Normal breath sounds. No stridor. No wheezing or rales.  Abdominal:     General: Bowel sounds are normal. There is no distension.     Palpations: Abdomen is soft. There is no mass.     Tenderness: There is no abdominal tenderness. There is no right CVA tenderness, left CVA tenderness, guarding or rebound.  Musculoskeletal:     Right lower leg: Edema present.     Left lower leg: No edema.  Skin:     General: Skin is warm and dry.  Neurological:     General: No focal deficit present.     Mental Status: He is alert and oriented to person, place, and time.     Imaging: IR Radiologist Eval & Mgmt  Result Date: 01/16/2022 Please refer to notes tab for details about interventional procedure. (Op Note)   Labs:  CBC: Recent Labs    03/20/21 1154  WBC 4.8  HGB 13.1  HCT 39.7  PLT 141*    COAGS: Recent Labs    11/08/21 1120 11/29/21 1125 12/28/21 1153 01/11/22 1113  INR 3.7* 3.7* 3.4* 2.0    BMP: 10/19/2021: BUN 32, Cr 1.2, EGFR 57 mL/min  Assessment and Plan:  I met with Daniel Love and his daughter Daniel Love.  We reviewed imaging that demonstrates a very slowly enlarging solid mass of the right kidney with imaging characteristics most likely consistent with a low-grade carcinoma such as a papillary carcinoma.  Most likely possibility would be an oncocytoma.  Given tumor enlargement, there is indication for treatment.  The lesion is in an approachable location for percutaneous ablation with cryoablation and also of amenable size. Biopsy of the mass would be performed at the time of ablation.  Details of cryoablation and biopsy were discussed with Daniel Love which would include the use of general anesthesia and overnight observation. He would also have to hold Coumadin for a few days before the procedure.  We will consult with Dr. Gasper Sells regarding cessation of Coumadin, any need for a Lovenox bridge and whether any additional cardiac clearance would be needed prior to ablation under general anesthesia.  After discussion with Daniel Love and his daughter, he would like to proceed with scheduling cryoablation of the right renal tumor.  We will proceed with the scheduling and authorization process.  The procedure will be performed at [facility].  Thank you for this interesting consult.  I greatly enjoyed meeting Daniel Love and look forward to  participating in their care.  A copy of this report was sent to the requesting provider on this date.  Electronically Signed: Azzie Roup 01/16/2022, 1:59 PM    I spent a total of 30 Minutes in face to face in clinical consultation, greater than 50% of which was counseling/coordinating care for a right renal mass.

## 2022-01-17 ENCOUNTER — Other Ambulatory Visit (HOSPITAL_COMMUNITY): Payer: Self-pay | Admitting: Interventional Radiology

## 2022-01-17 ENCOUNTER — Telehealth: Payer: Self-pay | Admitting: Cardiovascular Disease

## 2022-01-17 DIAGNOSIS — N2889 Other specified disorders of kidney and ureter: Secondary | ICD-10-CM

## 2022-01-17 NOTE — Telephone Encounter (Signed)
° °  Pre-operative Risk Assessment    Patient Name: Daniel Love  DOB: [birth year] MRN: [id number]      Request for Surgical Clearance    Procedure:   Biopsy and cryoablation of a right renal mass  Date of Surgery:  Clearance TBD                                 Surgeon:  Dr. Lorrene Reid Group or Practice Name:  [facility]  Phone number:  [phone] Fax number:  [phone]   Type of Clearance Requested:   - Medical  - Pharmacy:  Hold Warfarin (Coumadin) 4 days prior    Type of Anesthesia:  General    Additional requests/questions:   Office is in [place] as well as Dr. Margaretmary Dys notes in regards to this procedure.  Crist Infante   01/17/2022, 8:08 AM

## 2022-01-17 NOTE — Telephone Encounter (Signed)
Clinical pharmacist to review Coumadin ?

## 2022-01-17 NOTE — Telephone Encounter (Signed)
Dr. Gasper Sells to review.  I spoke with Daniel Love, he denies any recent exertional type of chest pain or worsening dyspnea.  He does complain of rare episode of chest pain that occurs after food at rest which he attributed to gas. Symptom does not occur with exertion.  He is able to ambulate for more than 2 blocks away from his home and back without any issue.  From the cardiac perspective, it appears he is able to achieve more than 4 METS of activity.  Would it be okay from your perspective for him to proceed with right renal mass biopsy and cryoablation procedure?  Please forward your response to P CV DIV PREOP

## 2022-01-17 NOTE — Telephone Encounter (Signed)
Patient with diagnosis of A Fib on coumadin for anticoagulation.    Procedure: Biopsy and cryoablation of a right renal mass Date of procedure: TBD   CHA2DS2-VASc Score = 4  This indicates a 4.8% annual risk of stroke. The patient's score is based upon: CHF History: 1 HTN History: 1 Diabetes History: 0 Stroke History: 0 Vascular Disease History: 0 Age Score: 2 Gender Score: 0  CrCl 59 mL/min Platelet count 141K  Per office protocol, patient can hold warfarin  for 5 days prior to procedure.    Patient will not need bridging with Lovenox (enoxaparin) around procedure.  Please have patient restart warfarin as soon as surgeon deems safe

## 2022-01-18 NOTE — Telephone Encounter (Signed)
° ° °  Patient Name: Daniel Love  DOB: 12/09/1940 MRN: [id number]  Primary Cardiologist: Mertie Moores, MD  Chart reviewed as part of pre-operative protocol coverage. Given past medical history and time since last visit, based on ACC/AHA guidelines, Daniel Love would be at acceptable risk for the planned procedure without further cardiovascular testing.   Per Dr. Gasper Sells, "Messaged Dr. Kathlene Cote yesterday  Sounds as if his symptoms burden is minimal.  His risk will be slightly higher because of his multiple comorbidities, but ok to stop Daniel Love and proceed."  He may hold Coumadin for 5 days prior to the procedure and restart as soon as possible afterward at the surgeon's discretion.  The patient was advised that if he develops new symptoms prior to surgery to contact our office to arrange for a follow-up visit, and he verbalized understanding.  I will route this recommendation to the requesting party via Epic fax function and remove from pre-op pool.  Please call with questions.  Daniel Love, [place] 01/18/2022, 1:14 PM

## 2022-01-24 DIAGNOSIS — L853 Xerosis cutis: Secondary | ICD-10-CM | POA: Diagnosis not present

## 2022-01-24 DIAGNOSIS — L570 Actinic keratosis: Secondary | ICD-10-CM | POA: Diagnosis not present

## 2022-01-24 DIAGNOSIS — L812 Freckles: Secondary | ICD-10-CM | POA: Diagnosis not present

## 2022-01-24 DIAGNOSIS — D1801 Hemangioma of skin and subcutaneous tissue: Secondary | ICD-10-CM | POA: Diagnosis not present

## 2022-01-24 DIAGNOSIS — Z85828 Personal history of other malignant neoplasm of skin: Secondary | ICD-10-CM | POA: Diagnosis not present

## 2022-01-24 DIAGNOSIS — L821 Other seborrheic keratosis: Secondary | ICD-10-CM | POA: Diagnosis not present

## 2022-01-24 DIAGNOSIS — L308 Other specified dermatitis: Secondary | ICD-10-CM | POA: Diagnosis not present

## 2022-01-26 NOTE — Patient Instructions (Addendum)
DUE TO COVID-19 ONLY ONE VISITOR IS ALLOWED TO COME WITH YOU AND STAY IN THE WAITING ROOM ONLY DURING PRE OP AND PROCEDURE DAY OF SURGERY.   Up to two visitors ages 16+ are allowed at one time in a patient's room.  The visitors may rotate out with other people throughout the day.  Additionally, up to two children between the ages of 43 and 66 are allowed and do not count toward the number of allowed visitors.  Children within this age range must be accompanied by an adult visitor.  One adult visitor may remain with the patient overnight and must be in the room by 8 PM.  YOU NEED TO HAVE A COVID 19 TEST ON_2-20-23  @______  THIS TEST MUST BE DONE BEFORE SURGERY,     COVID TESTING [place] COME IN THROUGH MAIN ENTRANCE BE SEATED INT THE LOBBY AREA TO THE RIGHT AS YOU COME IN THE MAIN ENTRANCE DIAL [phone] GIVE THEM YOUR NAME AND LET THEM KNOW YOU ARE HERE FOR COVID TESTING    ONCE YOUR COVID TEST IS COMPLETED,  PLEASE Wear a mask when in [place]           Your procedure is scheduled on: 02-21-22   Report to [facility] Main  Entrance   Report to admitting at     0700  AM     Call this number if you have problems the morning of surgery [phone]   Remember: Do not eat food or drink liquids :After Midnight.    BRUSH YOUR TEETH MORNING OF SURGERY AND RINSE YOUR MOUTH OUT, NO CHEWING GUM CANDY OR MINTS.     Take these medicines the morning of surgery with A SIP OF WATER: eyedrops as usual, Imdur                                 You may not have any metal on your body including hair pins and              piercings  Do not wear jewelry, make-up, lotions, powders,perfumes,  or      deodorant                     Men may shave face and neck.   Do not bring valuables to the hospital. [place].  Contacts, dentures or bridgework may not be worn into surgery.  You may bring a  small overnight bag with you     Patients discharged the day of surgery will not be allowed to drive home. IF YOU ARE HAVING SURGERY AND GOING HOME THE SAME DAY, YOU MUST HAVE AN ADULT TO DRIVE YOU HOME AND BE WITH YOU FOR 24 HOURS. YOU MAY GO HOME BY TAXI OR UBER OR ORTHERWISE, BUT AN ADULT MUST ACCOMPANY YOU HOME AND STAY WITH YOU FOR 24 HOURS.  Name and phone number of your driver:  Special Instructions: N/A              Please read over the following fact sheets you were given: _____________________________________________________________________             [facility] - Preparing for Surgery Before surgery, you can play an important role.  Because skin is not sterile,  your skin needs to be as free of germs as possible.  You can reduce the number of germs on your skin by washing with CHG (chlorahexidine gluconate) soap before surgery.  CHG is an antiseptic cleaner which kills germs and bonds with the skin to continue killing germs even after washing. Please DO NOT use if you have an allergy to CHG or antibacterial soaps.  If your skin becomes reddened/irritated stop using the CHG and inform your nurse when you arrive at Short Stay. Do not shave (including legs and underarms) for at least 48 hours prior to the first CHG shower.  You may shave your face/neck. Please follow these instructions carefully:  1.  Shower with CHG Soap the night before surgery and the  morning of Surgery.  2.  If you choose to wash your hair, wash your hair first as usual with your  normal  shampoo.  3.  After you shampoo, rinse your hair and body thoroughly to remove the  shampoo.                           4.  Use CHG as you would any other liquid soap.  You can apply chg directly  to the skin and wash                       Gently with a scrungie or clean washcloth.  5.  Apply the CHG Soap to your body ONLY FROM THE NECK DOWN.   Do not use on face/ open                           Wound or open sores. Avoid contact  with eyes, ears mouth and genitals (private parts).                       Wash face,  Genitals (private parts) with your normal soap.             6.  Wash thoroughly, paying special attention to the area where your surgery  will be performed.  7.  Thoroughly rinse your body with warm water from the neck down.  8.  DO NOT shower/wash with your normal soap after using and rinsing off  the CHG Soap.                9.  Pat yourself dry with a clean towel.            10.  Wear clean pajamas.            11.  Place clean sheets on your bed the night of your first shower and do not  sleep with pets. Day of Surgery : Do not apply any lotions/deodorants the morning of surgery.  Please wear clean clothes to the hospital/surgery center.  FAILURE TO FOLLOW THESE INSTRUCTIONS MAY RESULT IN THE CANCELLATION OF YOUR SURGERY PATIENT SIGNATURE_________________________________  NURSE SIGNATURE__________________________________  ________________________________________________________________________

## 2022-01-31 ENCOUNTER — Ambulatory Visit (INDEPENDENT_AMBULATORY_CARE_PROVIDER_SITE_OTHER): Payer: Medicare Other

## 2022-01-31 ENCOUNTER — Other Ambulatory Visit: Payer: Self-pay

## 2022-01-31 DIAGNOSIS — I4821 Permanent atrial fibrillation: Secondary | ICD-10-CM | POA: Diagnosis not present

## 2022-01-31 DIAGNOSIS — Z7901 Long term (current) use of anticoagulants: Secondary | ICD-10-CM | POA: Diagnosis not present

## 2022-01-31 DIAGNOSIS — Z5181 Encounter for therapeutic drug level monitoring: Secondary | ICD-10-CM | POA: Diagnosis not present

## 2022-01-31 LAB — POCT INR: INR: 2.1 (ref 2.0–3.0)

## 2022-01-31 NOTE — Patient Instructions (Addendum)
Description   Continue taking Warfarin 1 tablet daily.  **Hold Warfarin 5 days pre procedure and resume Warfarin post procedure or as directed by surgeon.  Recheck INR 1 week post procedure.  Call us with any update or changes [phone].

## 2022-01-31 NOTE — Progress Notes (Signed)
Sent message, via epic in basket, requesting orders in epic from surgeon.  

## 2022-02-01 ENCOUNTER — Encounter (HOSPITAL_COMMUNITY)
Admission: RE | Admit: 2022-02-01 | Discharge: 2022-02-01 | Disposition: A | Discharge status: Home / Self Care | Payer: Medicare Other | Source: Ambulatory Visit | Attending: Interventional Radiology | Admitting: Interventional Radiology

## 2022-02-01 ENCOUNTER — Other Ambulatory Visit: Payer: Self-pay

## 2022-02-01 ENCOUNTER — Encounter (HOSPITAL_COMMUNITY): Payer: Self-pay

## 2022-02-01 VITALS — BP 123/64 | HR 72 | Temp 98.0°F | Resp 16 | Ht 72.0 in | Wt 188.4 lb

## 2022-02-01 DIAGNOSIS — I4821 Permanent atrial fibrillation: Secondary | ICD-10-CM | POA: Diagnosis not present

## 2022-02-01 DIAGNOSIS — Z01812 Encounter for preprocedural laboratory examination: Secondary | ICD-10-CM | POA: Diagnosis not present

## 2022-02-01 DIAGNOSIS — N2889 Other specified disorders of kidney and ureter: Secondary | ICD-10-CM | POA: Insufficient documentation

## 2022-02-01 DIAGNOSIS — I119 Hypertensive heart disease without heart failure: Secondary | ICD-10-CM | POA: Insufficient documentation

## 2022-02-01 HISTORY — DX: Unspecified osteoarthritis, unspecified site: M19.90

## 2022-02-01 HISTORY — DX: Cardiac arrhythmia, unspecified: I49.9

## 2022-02-01 LAB — BASIC METABOLIC PANEL
Anion gap: 4 — ABNORMAL LOW (ref 5–15)
BUN: 34 mg/dL — ABNORMAL HIGH (ref 8–23)
CO2: 29 mmol/L (ref 22–32)
Calcium: 9.3 mg/dL (ref 8.9–10.3)
Chloride: 108 mmol/L (ref 98–111)
Creatinine, Ser: 1.11 mg/dL (ref 0.61–1.24)
GFR, Estimated: >60 mL/min (ref >60)
Glucose, Bld: 77 mg/dL (ref 70–99)
Potassium: 4.4 mmol/L (ref 3.5–5.1)
Sodium: 141 mmol/L (ref 135–145)

## 2022-02-01 LAB — CBC
HCT: 40 % (ref 39.0–52.0)
Hemoglobin: 12.9 g/dL — ABNORMAL LOW (ref 13.0–17.0)
MCH: 29.9 pg (ref 26.0–34.0)
MCHC: 32.3 g/dL (ref 30.0–36.0)
MCV: 92.6 fL (ref 80.0–100.0)
Platelets: 150 10*3/uL (ref 150–400)
RBC: 4.32 MIL/uL (ref 4.22–5.81)
RDW: 15.2 % (ref 11.5–15.5)
WBC: 4.9 10*3/uL (ref 4.0–10.5)
nRBC: 0 % (ref 0.0–0.2)

## 2022-02-01 LAB — PROTIME-INR
INR: 2.1 — ABNORMAL HIGH (ref 0.8–1.2)
Prothrombin Time: 23.2 seconds — ABNORMAL HIGH (ref 11.4–15.2)

## 2022-02-01 NOTE — Progress Notes (Addendum)
PCP - Veronia Beets, MD Cardiologist - clearance 01-18-22 Almyra Deforest PA   PPM/ICD -  Device Orders -  Rep Notified -   Chest x-ray - CTA chest 02-22-21 EKG - 09-27-21 epic Stress Test - 2019 ECHO - 03-2021 Cardiac Cath -   Sleep Study -  CPAP -   Fasting Blood Sugar -  Checks Blood Sugar _____ times a day  Blood Thinner Instructions:coumadin Holds 5 days  Aspirin Instructions:  ERAS Protcol - PRE-SURGERY Ensure or G2-   COVID TEST- + home COVID test 12-14-21  COVID vaccine -Yes x 3   Activity--Able to walk a flight of stairs without SOB Anesthesia review: HTN, Afib, PT/INR  Patient denies shortness of breath, fever, cough and chest pain at PAT appointment   All instructions explained to the patient, with a verbal understanding of the material. Patient agrees to go over the instructions while at home for a better understanding. Patient also instructed to self quarantine after being tested for COVID-19. The opportunity to ask questions was provided.

## 2022-02-02 ENCOUNTER — Other Ambulatory Visit: Payer: Self-pay | Admitting: Radiology

## 2022-02-02 NOTE — Anesthesia Preprocedure Evaluation (Addendum)
Anesthesia Evaluation  Patient identified by MRN, date of birth, ID band Patient awake    Reviewed: Allergy & Precautions, NPO status , Patient's Chart, lab work & pertinent test results  Airway Mallampati: I  TM Distance: >3 FB Neck ROM: Full    Dental  (+) Teeth Intact, Dental Advisory Given   Pulmonary former smoker,    breath sounds clear to auscultation       Cardiovascular hypertension, Pt. on medications + dysrhythmias Atrial Fibrillation  Rhythm:Regular Rate:Normal  Echo: 1. Left ventricular ejection fraction, by estimation, is 50 to 55%. The  left ventricle has low normal function. The left ventricle demonstrates  global hypokinesis. Left ventricular diastolic function could not be  evaluated.  2. Right ventricular systolic function is mildly reduced. The right  ventricular size is moderately enlarged. There is moderately elevated  pulmonary artery systolic pressure. The estimated right ventricular  systolic pressure is 79.8 mmHg.  3. Left atrial size was severely dilated.  4. Right atrial size was severely dilated.  5. The mitral valve is myxomatous. Moderate mitral valve regurgitation.  There is mild late systolic prolapse of both leaflets of the mitral valve.  6. The tricuspid valve is myxomatous. Tricuspid valve regurgitation is  moderate.  7. The aortic valve is tricuspid. Aortic valve regurgitation is mild.  Mild aortic valve sclerosis is present, with no evidence of aortic valve  stenosis.  8. Aortic dilatation noted. There is mild dilatation of the aortic root,  measuring 44 mm. There is mild dilatation of the ascending aorta,  measuring 43 mm.  9. The inferior vena cava is dilated in size with <50% respiratory  variability, suggesting right atrial pressure of 15 mmHg.    Neuro/Psych negative neurological ROS  negative psych ROS   GI/Hepatic Neg liver ROS, GERD  ,  Endo/Other  negative endocrine  ROS  Renal/GU negative Renal ROS     Musculoskeletal  (+) Arthritis ,   Abdominal Normal abdominal exam  (+)   Peds  Hematology negative hematology ROS (+)   Anesthesia Other Findings   Reproductive/Obstetrics                           Anesthesia Physical Anesthesia Plan  ASA: 3  Anesthesia Plan: General   Post-op Pain Management:    Induction: Intravenous  PONV Risk Score and Plan: 3 and Ondansetron and Treatment may vary due to age or medical condition  Airway Management Planned: Oral ETT  Additional Equipment: None  Intra-op Plan:   Post-operative Plan: Extubation in OR  Informed Consent: I have reviewed the patients History and Physical, chart, labs and discussed the procedure including the risks, benefits and alternatives for the proposed anesthesia with the patient or authorized representative who has indicated his/her understanding and acceptance.     Dental advisory given  Plan Discussed with: CRNA  Anesthesia Plan Comments: (See APP note by Durel Salts, FNP)      Anesthesia Quick Evaluation

## 2022-02-02 NOTE — Progress Notes (Signed)
Anesthesia Chart Review:   Case: [id number] Date/Time: 02/21/22 0815   Procedure: CT WITH ANESTHESIA CRYOABLATION   Anesthesia type: General   Pre-op diagnosis: RIGHT RENAL MASS   [facility]   Surgeons: Aletta Edouard, MD       DISCUSSION: Pt is 82 years old with hx atrial fibrillation, HTN, ascending aortic aneurysm (4.5cm; recheck in 2024 per CT surgery)  Pt to stop coumadin 5 days before surgery. PT 23.2 on 02/01/22; will recheck day of surgery  VS: BP 123/64    Pulse 72    Temp 36.7 C (Oral)    Resp 16    Ht 6' (1.829 m)    Wt 85.4 kg    SpO2 99%    BMI 25.55 kg/m   PROVIDERS: - PCP is Chesley Noon, MD - Cardiologist is Rudean Haskell, MD. Last office visit 09/27/21.    LABS: Labs reviewed: Repeat  - PT/INR is 23.2/2.1.  Will recheck day of surgery.   (all labs ordered are listed, but only abnormal results are displayed)  Labs Reviewed  BASIC METABOLIC PANEL - Abnormal; Notable for the following components:      Result Value   BUN 34 (*)    Anion gap 4 (*)    All other components within normal limits  CBC - Abnormal; Notable for the following components:   Hemoglobin 12.9 (*)    All other components within normal limits  PROTIME-INR - Abnormal; Notable for the following components:   Prothrombin Time 23.2 (*)    INR 2.1 (*)    All other components within normal limits     IMAGES: CT angio chest aorta 02/22/21:  1. Stable prominence of the ascending thoracic aorta measuring 4.5 x 4.5 cm. Other thoracic aortic measurements as noted. No evident dissection. Ascending thoracic aortic aneurysm. Recommend semi-annual imaging followup by CTA or MRA   EKG 09/27/21: AF rate 57 SVR   CV: Echo 04/19/21:  1. Left ventricular ejection fraction, by estimation, is 50 to 55%. The left ventricle has low normal function. The left ventricle demonstrates global hypokinesis. Left ventricular diastolic function could not be evaluated.  2. Right ventricular  systolic function is mildly reduced. The right ventricular size is moderately enlarged. There is moderately elevated pulmonary artery systolic pressure. The estimated right ventricular systolic pressure is 76.5 mmHg.  3. Left atrial size was severely dilated.  4. Right atrial size was severely dilated.  5. The mitral valve is myxomatous. Moderate mitral valve regurgitation. There is mild late systolic prolapse of both leaflets of the mitral valve.  6. The tricuspid valve is myxomatous. Tricuspid valve regurgitation is moderate.  7. The aortic valve is tricuspid. Aortic valve regurgitation is mild. Mild aortic valve sclerosis is present, with no evidence of aortic valve stenosis.  8. Aortic dilatation noted. There is mild dilatation of the aortic root, measuring 44 mm. There is mild dilatation of the ascending aorta, measuring 43 mm.   9. The inferior vena cava is dilated in size with <50% respiratory variability, suggesting right atrial pressure of 15 mmHg.   NM Stress Testing 02/17/2018:  Results: Nuclear stress EF: 42%. There was no ST segment deviation noted during stress. This is an intermediate risk study. The left ventricular ejection fraction is moderately decreased (30-44%). 1. EF 42%, diffuse hypokinesis.  2. Fixed small, mild apical lateral perfusion defect.  Possible prior infarction, no evidence for significant ischemia.     Past Medical History:  Diagnosis Date  Anal fissure    hx of    Arthritis    Atrial fibrillation (HCC)    maintaining normal sinus rhythm   Chronic anticoagulation    coumadin   Dyspepsia    Dysrhythmia    Afib   GERD (gastroesophageal reflux disease)    Glaucoma    History of colon polyps 2012   Colonoscopy-Dr. Benson [place]    History of echocardiogram    Echocardiogram 2/19: EF 60-65, normal wall motion, trivial AI, moderate to severe MR, mild LAE, mild RAE, mild to moderate TR, PASP 35   History of kidney stones    History of nuclear stress test     Myoview 2/19: EF 42, diffuse HK no significant ischemia, apical lateral scar   Hypertension    Prostate cancer ([place])    Skin cancer    skin cancer removed from left arm and back    Past Surgical History:  Procedure Laterality Date   BACK SURGERY  12/31/1977   for degenerative disc disease   COLONOSCOPY     IR RADIOLOGIST EVAL & MGMT  01/16/2022   PROSTATE BIOPSY     TEE WITHOUT CARDIOVERSION N/A 03/04/2019   Procedure: TRANSESOPHAGEAL ECHOCARDIOGRAM (TEE);  Surgeon: Thayer Headings, MD;  Location: [facility] END[facility]COPY;  Service: Cardiovascular;  Laterality: N/A;   toe removed     Right second toe   UPPER GASTROINTESTINAL ENDOSCOPY      MEDICATIONS:  acetaminophen (TYLENOL) 500 MG tablet   Boswellia-Glucosamine-Vit D (OSTEO BI-FLEX ONE PER DAY) TABS   colchicine 0.6 MG tablet   diphenhydramine-acetaminophen (TYLENOL PM) 25-500 MG TABS tablet   furosemide (LASIX) 20 MG tablet   ibuprofen (ADVIL) 200 MG tablet   isosorbide mononitrate (IMDUR) 30 MG 24 hr tablet   LUMIGAN 0.01 % SOLN   Menthol, Topical Analgesic, (ICY HOT BACK EX)   metroNIDAZOLE (METROGEL) 1 % gel   Multiple Vitamin (MULTIVITAMIN WITH MINERALS) TABS tablet   olmesartan-hydrochlorothiazide (BENICAR HCT) 40-25 MG tablet   oxymetazoline (AFRIN) 0.05 % nasal spray   timolol (TIMOPTIC) 0.5 % ophthalmic solution   warfarin (COUMADIN) 5 MG tablet   No current facility-administered medications for this encounter.    If labs acceptable day of surgery, I anticipate pt can proceed with surgery as scheduled.  Willeen Cass, PhD, FNP-BC [facility]/Anesthesiology Phone: [phone] 02/02/2022 3:20 PM

## 2022-02-20 ENCOUNTER — Other Ambulatory Visit: Payer: Self-pay | Admitting: Student

## 2022-02-20 NOTE — H&P (Signed)
Chief Complaint: Patient was seen in consultation today for renal cryoablation and biopsy at the request of [facility]  Referring Physician(s): Dr. [name]  Supervising Physician: Aletta Edouard  Patient Status: Daniel Love  History of Present Illness: Daniel Love is a 82 y.o. male with PMH of A-fib on anticoagulation, GERD, glaucoma, HTN and prostate cancer.  Patient consulted with Dr. Aletta Edouard, IR, 01/16/2022 at the request of [name], MD, for treatment options of right renal mass most likely consistent with low-grade carcinoma.  Patient presents today for percutaneous ablation of renal mass with cryoablation and renal mass biopsy with general anesthesia and overnight observation.  Past Medical History:  Diagnosis Date   Anal fissure    hx of    Arthritis    Atrial fibrillation ([place])    maintaining normal sinus rhythm   Chronic anticoagulation    coumadin   Dyspepsia    Dysrhythmia    Afib   GERD (gastroesophageal reflux disease)    Glaucoma    History of colon polyps 2012   Colonoscopy-Dr. Benson [place]    History of echocardiogram    Echocardiogram 2/19: EF 60-65, normal wall motion, trivial AI, moderate to severe MR, mild LAE, mild RAE, mild to moderate TR, PASP 35   History of kidney stones    History of nuclear stress test    Myoview 2/19: EF 42, diffuse HK no significant ischemia, apical lateral scar   Hypertension    Prostate cancer ([place])    Skin cancer    skin cancer removed from left arm and back    Past Surgical History:  Procedure Laterality Date   BACK SURGERY  12/31/1977   for degenerative disc disease   COLONOSCOPY     IR RADIOLOGIST EVAL & MGMT  01/16/2022   PROSTATE BIOPSY     TEE WITHOUT CARDIOVERSION N/A 03/04/2019   Procedure: TRANSESOPHAGEAL ECHOCARDIOGRAM (TEE);  Surgeon: Acie Fredrickson Wonda Cheng, MD;  Location: [facility] ENDOSCOPY;  Service: Cardiovascular;  Laterality: N/A;   toe removed     Right second toe   UPPER  GASTROINTESTINAL ENDOSCOPY      Allergies: Amlodipine and Lisinopril  Medications: Prior to Admission medications   Medication Sig Start Date End Date Taking? Authorizing Provider  acetaminophen (TYLENOL) 500 MG tablet Take 1,000 mg by mouth every 6 (six) hours as needed (for pain.).    [provider]  Boswellia-Glucosamine-Vit D (OSTEO BI-FLEX ONE PER DAY) TABS Take 1 tablet by mouth in the morning.    [provider]  colchicine 0.6 MG tablet Take 0.6 mg by mouth daily as needed (gout flare). 07/30/21   [provider]  diphenhydramine-acetaminophen (TYLENOL PM) 25-500 MG TABS tablet Take 1 tablet by mouth at bedtime as needed (sleep).    [provider]  furosemide (LASIX) 20 MG tablet TAKE ONE TABLET BY MOUTH DAILY AS NEEDED Patient taking differently: Take 20 mg by mouth in the morning. 10/02/21   Chandrasekhar, Lyda Kalata A, MD  ibuprofen (ADVIL) 200 MG tablet Take 200-400 mg by mouth every 8 (eight) hours as needed (pain.).    [provider]  isosorbide mononitrate (IMDUR) 30 MG 24 hr tablet Take 15 mg by mouth in the morning. 10/13/18   [provider]  LUMIGAN 0.01 % SOLN Place 1 drop into both eyes daily. Midday 06/13/16   [provider]  Menthol, Topical Analgesic, (ICY HOT BACK EX) Apply 1 application topically daily as needed (pain).    [provider]  metroNIDAZOLE (METROGEL) 1 % gel Apply 1 application topically daily as needed (rosacea). 05/23/21 05/23/22  [provider]  Multiple Vitamin (MULTIVITAMIN WITH MINERALS) TABS tablet Take 1 tablet by mouth in the morning. Centrum Silver    [provider]  olmesartan-hydrochlorothiazide (BENICAR HCT) 40-25 MG tablet TAKE ONE TABLET BY MOUTH DAILY 06/05/21   Nahser, Wonda Cheng, MD  oxymetazoline (AFRIN) 0.05 % nasal spray Place 1 spray into both nostrils at bedtime as needed for congestion.    [provider]  timolol (TIMOPTIC) 0.5 %  ophthalmic solution 1 drop 2 (two) times daily. 03/07/20   [provider]  warfarin (COUMADIN) 5 MG tablet TAKE 1 TO 1 AND 1/2 TABLET BY MOUTH DAILY AS INSTRUCTED BY COUMADIN CLINIC Patient taking differently: Take 5 mg by mouth in the morning. 08/31/21   Werner Lean, MD     Family History  Problem Relation Age of Onset   Heart failure Father    Cancer Father        associated with her lungs   Heart attack Father    Cancer Mother        uncertain/possibly lung   Colon cancer Maternal Grandmother    Stroke Neg Hx    Esophageal cancer Neg Hx    Rectal cancer Neg Hx    Stomach cancer Neg Hx     Social History   Socioeconomic History   Marital status: Married    Spouse name: Not on file   Number of children: 3   Years of education: Not on file   Highest education level: Not on file  Occupational History   Occupation: Retired  Tobacco Use   Smoking status: Former    Packs/day: 1.00    Years: 7.00    Pack years: 7.00    Types: Cigarettes    Quit date: 12/31/1966    Years since quitting: 55.1   Smokeless tobacco: Never  Vaping Use   Vaping Use: Never used  Substance and Sexual Activity   Alcohol use: Yes    Alcohol/week: 7.0 standard drinks    Types: 7 Glasses of wine per week    Comment: 1 weekly   Drug use: No   Sexual activity: Not Currently  Other Topics Concern   Not on file  Social History Narrative   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 They share three daughters, two live local, one in [place]   09-26-18 Unable to ask abuse questions wife and daughter with him today.   Social Determinants of Health   Financial Resource Strain: Not on file  Food Insecurity: Not on file  Transportation Needs: Not on file  Physical Activity: Not on file  Stress: Not on file  Social Connections: Not on file      Review of Systems: A 12 point ROS discussed and pertinent positives are indicated in the HPI above.  All other  systems are negative.  Review of Systems  Constitutional:  Negative for chills and fever.  HENT:  Negative for nosebleeds.   Eyes:  Negative for visual disturbance.  Respiratory:  Negative for cough and shortness of breath.   Cardiovascular:  Negative for chest pain and leg swelling.  Gastrointestinal:  Negative for abdominal pain, nausea and vomiting.  Genitourinary:  Negative for hematuria.  Neurological:  Negative for dizziness, light-headedness and headaches.   Vital Signs: There were no vitals taken for this visit.  Physical Exam Constitutional:  Appearance: Normal appearance. He is not ill-appearing.  HENT:     Head: Normocephalic and atraumatic.     Mouth/Throat:     Mouth: Mucous membranes are moist.     Pharynx: Oropharynx is clear.  Eyes:     Extraocular Movements: Extraocular movements intact.     Pupils: Pupils are equal, round, and reactive to light.  Cardiovascular:     Rate and Rhythm: Normal rate. Rhythm irregular.     Pulses: Normal pulses.     Heart sounds: Normal heart sounds. No murmur heard.   No friction rub. No gallop.  Pulmonary:     Effort: Pulmonary effort is normal. No respiratory distress.     Breath sounds: Normal breath sounds. No stridor. No wheezing, rhonchi or rales.  Abdominal:     General: Bowel sounds are normal. There is no distension.     Palpations: Abdomen is soft.     Tenderness: There is no abdominal tenderness.  Musculoskeletal:     Right lower leg: No edema.     Left lower leg: No edema.  Skin:    General: Skin is dry.  Neurological:     Mental Status: He is alert and oriented to person, place, and time.  Psychiatric:        Mood and Affect: Mood normal.        Behavior: Behavior normal.        Thought Content: Thought content normal.        Judgment: Judgment normal.    Imaging: DG Chest 1 View  Result Date: 02/21/2022 CLINICAL DATA:  Preoperative respiratory exam prior to renal mass cryoablation. EXAM: CHEST  1  VIEW COMPARISON:  01/23/2010 FINDINGS: The heart size and mediastinal contours are within normal limits. Stable left basilar scarring. There is no evidence of pulmonary edema, consolidation, pneumothorax, nodule or pleural fluid. The visualized skeletal structures are unremarkable. IMPRESSION: No active disease. Electronically Signed   By: Aletta Edouard M.D.   On: 02/21/2022 08:13    Labs:  CBC: Recent Labs    03/20/21 1154 02/01/22 1120  WBC 4.8 4.9  HGB 13.1 12.9*  HCT 39.7 40.0  PLT 141* 150    COAGS: Recent Labs    01/11/22 1113 01/31/22 1107 02/01/22 1120 02/21/22 0706  INR 2.0 2.1 2.1* 1.1    BMP: Recent Labs    02/01/22 1120  NA 141  K 4.4  CL 108  CO2 29  GLUCOSE 77  BUN 34*  CALCIUM 9.3  CREATININE 1.11  GFRNONAA >60    LIVER FUNCTION TESTS: No results for input(s): BILITOT, AST, ALT, ALKPHOS, PROT, ALBUMIN in the last 8760 hours.  TUMOR MARKERS: No results for input(s): AFPTM, CEA, CA199, CHROMGRNA in the last 8760 hours.  Assessment and Plan: History of A-fib on anticoagulation, GERD, glaucoma, HTN and prostate cancer.  Patient consulted with Dr. Aletta Edouard, IR, 01/16/2022 at the request of [name], MD, for treatment options of right renal mass most likely consistent with low-grade carcinoma.  Patient presents today for percutaneous ablation of renal mass with cryoablation and renal mass biopsy with general anesthesia and overnight observation.  Pt sitting upright on stretcher. Dr. Kathlene Cote at bedside. Pt is A&O, calm and pleasant. He is in no distress.  Pt states he stopped his Coumadin 5 days ago.  Today's labs pending.   Risks and benefits of image guided renal cryoablation and biopsy was discussed with the patient including, but not limited to, failure to treat entire lesion, bleeding,  infection, damage to adjacent structures, hematuria, urine leak, decrease in renal function or post procedural neuropathy.  All of the patient's  questions were answered and the patient is agreeable to proceed. Consent signed and in chart.   Thank you for this interesting consult.  I greatly enjoyed meeting KANI CHAUVIN and look forward to participating in their care.  A copy of this report was sent to the requesting provider on this date.  Electronically Signed: Tyson Alias, NP 02/21/2022, 8:37 AM   I spent a total of 20 minutes in face to face in clinical consultation, greater than 50% of which was counseling/coordinating care for renal mass cryoablation and biopsy with general anesthesia.

## 2022-02-20 NOTE — H&P (Deleted)
  The note originally documented on this encounter has been moved the the encounter in which it belongs.  

## 2022-02-21 ENCOUNTER — Encounter (HOSPITAL_COMMUNITY): Payer: Self-pay | Admitting: Interventional Radiology

## 2022-02-21 ENCOUNTER — Ambulatory Visit (HOSPITAL_BASED_OUTPATIENT_CLINIC_OR_DEPARTMENT_OTHER): Payer: Medicare Other | Admitting: Emergency Medicine

## 2022-02-21 ENCOUNTER — Encounter (HOSPITAL_COMMUNITY): Payer: Self-pay

## 2022-02-21 ENCOUNTER — Observation Stay (HOSPITAL_COMMUNITY)
Admission: RE | Admit: 2022-02-21 | Discharge: 2022-02-22 | Disposition: A | Discharge status: Home / Self Care | Payer: Medicare Other | Attending: Interventional Radiology | Admitting: Interventional Radiology

## 2022-02-21 ENCOUNTER — Encounter (HOSPITAL_COMMUNITY)
Admission: RE | Disposition: A | Discharge status: Home / Self Care | Payer: Self-pay | Source: Home / Self Care | Attending: Interventional Radiology

## 2022-02-21 ENCOUNTER — Ambulatory Visit (HOSPITAL_COMMUNITY): Payer: Medicare Other

## 2022-02-21 ENCOUNTER — Observation Stay (HOSPITAL_COMMUNITY)
Admission: RE | Admit: 2022-02-21 | Discharge: 2022-02-21 | Disposition: A | Discharge status: Home / Self Care | Payer: Medicare Other | Source: Ambulatory Visit | Attending: Interventional Radiology | Admitting: Interventional Radiology

## 2022-02-21 ENCOUNTER — Ambulatory Visit (HOSPITAL_COMMUNITY): Payer: Medicare Other | Admitting: Emergency Medicine

## 2022-02-21 DIAGNOSIS — I4891 Unspecified atrial fibrillation: Secondary | ICD-10-CM | POA: Insufficient documentation

## 2022-02-21 DIAGNOSIS — Z79899 Other long term (current) drug therapy: Secondary | ICD-10-CM | POA: Diagnosis not present

## 2022-02-21 DIAGNOSIS — N2889 Other specified disorders of kidney and ureter: Secondary | ICD-10-CM | POA: Diagnosis not present

## 2022-02-21 DIAGNOSIS — C641 Malignant neoplasm of right kidney, except renal pelvis: Secondary | ICD-10-CM | POA: Diagnosis not present

## 2022-02-21 DIAGNOSIS — Z87891 Personal history of nicotine dependence: Secondary | ICD-10-CM | POA: Diagnosis not present

## 2022-02-21 DIAGNOSIS — K219 Gastro-esophageal reflux disease without esophagitis: Secondary | ICD-10-CM | POA: Diagnosis not present

## 2022-02-21 DIAGNOSIS — Z01818 Encounter for other preprocedural examination: Secondary | ICD-10-CM

## 2022-02-21 DIAGNOSIS — Z85828 Personal history of other malignant neoplasm of skin: Secondary | ICD-10-CM | POA: Insufficient documentation

## 2022-02-21 DIAGNOSIS — M1990 Unspecified osteoarthritis, unspecified site: Secondary | ICD-10-CM

## 2022-02-21 DIAGNOSIS — Z7901 Long term (current) use of anticoagulants: Secondary | ICD-10-CM | POA: Insufficient documentation

## 2022-02-21 DIAGNOSIS — I10 Essential (primary) hypertension: Secondary | ICD-10-CM | POA: Diagnosis not present

## 2022-02-21 DIAGNOSIS — Z8546 Personal history of malignant neoplasm of prostate: Secondary | ICD-10-CM | POA: Diagnosis not present

## 2022-02-21 HISTORY — PX: RADIOLOGY WITH ANESTHESIA: SHX6223

## 2022-02-21 LAB — PROTIME-INR
INR: 1.1 (ref 0.8–1.2)
Prothrombin Time: 14.2 seconds (ref 11.4–15.2)

## 2022-02-21 SURGERY — CT WITH ANESTHESIA
Anesthesia: General

## 2022-02-21 MED ORDER — OLMESARTAN MEDOXOMIL-HCTZ 40-25 MG PO TABS: 1.0000 | ORAL_TABLET | Freq: Every day | ORAL | Status: DC

## 2022-02-21 MED ORDER — EPINEPHRINE PF 1 MG/ML IJ SOLN
INTRAMUSCULAR | Status: AC
  Filled 2022-02-21: qty 1

## 2022-02-21 MED ORDER — PHENYLEPHRINE HCL-NACL 20-0.9 MG/250ML-% IV SOLN
INTRAVENOUS | Status: DC | PRN
  Administered 2022-02-21: 30 ug/min via INTRAVENOUS

## 2022-02-21 MED ORDER — FENTANYL CITRATE PF 50 MCG/ML IJ SOSY: 25.0000 ug | PREFILLED_SYRINGE | INTRAMUSCULAR | Status: DC | PRN

## 2022-02-21 MED ORDER — DOCUSATE SODIUM 100 MG PO CAPS
100.0000 mg | ORAL_CAPSULE | Freq: Two times a day (BID) | ORAL | Status: DC
  Administered 2022-02-21 – 2022-02-22 (×2): 100 mg via ORAL
  Filled 2022-02-21 (×2): qty 1

## 2022-02-21 MED ORDER — ISOSORBIDE MONONITRATE ER 30 MG PO TB24
15.0000 mg | ORAL_TABLET | Freq: Every day | ORAL | Status: DC
  Administered 2022-02-22: 15 mg via ORAL
  Filled 2022-02-21: qty 1

## 2022-02-21 MED ORDER — ACETAMINOPHEN 10 MG/ML IV SOLN
INTRAVENOUS | Status: AC
  Filled 2022-02-21: qty 100

## 2022-02-21 MED ORDER — PROPOFOL 10 MG/ML IV BOLUS
INTRAVENOUS | Status: DC | PRN
  Administered 2022-02-21: 80 mg via INTRAVENOUS

## 2022-02-21 MED ORDER — CEFAZOLIN SODIUM-DEXTROSE 2-3 GM-%(50ML) IV SOLR
INTRAVENOUS | Status: DC | PRN
  Administered 2022-02-21: 2 g via INTRAVENOUS

## 2022-02-21 MED ORDER — HYDROCHLOROTHIAZIDE 25 MG PO TABS
25.0000 mg | ORAL_TABLET | Freq: Every day | ORAL | Status: DC
  Administered 2022-02-21 – 2022-02-22 (×2): 25 mg via ORAL
  Filled 2022-02-21 (×2): qty 1

## 2022-02-21 MED ORDER — FENTANYL CITRATE (PF) 100 MCG/2ML IJ SOLN
INTRAMUSCULAR | Status: DC | PRN
  Administered 2022-02-21 (×3): 50 ug via INTRAVENOUS

## 2022-02-21 MED ORDER — CHLORHEXIDINE GLUCONATE 0.12 % MT SOLN
15.0000 mL | Freq: Once | OROMUCOSAL | Status: AC
  Administered 2022-02-21: 15 mL via OROMUCOSAL

## 2022-02-21 MED ORDER — LACTATED RINGERS IV SOLN: INTRAVENOUS | Status: DC

## 2022-02-21 MED ORDER — PHENYLEPHRINE 40 MCG/ML (10ML) SYRINGE FOR IV PUSH (FOR BLOOD PRESSURE SUPPORT)
PREFILLED_SYRINGE | INTRAVENOUS | Status: DC | PRN
Start: 2022-02-21 — End: 2022-02-21
  Administered 2022-02-21: 80 ug via INTRAVENOUS

## 2022-02-21 MED ORDER — LATANOPROST 0.005 % OP SOLN
1.0000 [drp] | Freq: Every day | OPHTHALMIC | Status: DC
  Administered 2022-02-21: 1 [drp] via OPHTHALMIC
  Filled 2022-02-21: qty 2.5

## 2022-02-21 MED ORDER — SENNOSIDES-DOCUSATE SODIUM 8.6-50 MG PO TABS: 1.0000 | ORAL_TABLET | Freq: Every day | ORAL | Status: DC | PRN

## 2022-02-21 MED ORDER — ACETAMINOPHEN 10 MG/ML IV SOLN
1000.0000 mg | Freq: Once | INTRAVENOUS | Status: DC | PRN
  Administered 2022-02-21: 1000 mg via INTRAVENOUS

## 2022-02-21 MED ORDER — TIMOLOL MALEATE 0.5 % OP SOLN
1.0000 [drp] | Freq: Two times a day (BID) | OPHTHALMIC | Status: DC
  Administered 2022-02-21 – 2022-02-22 (×2): 1 [drp] via OPHTHALMIC
  Filled 2022-02-21: qty 5

## 2022-02-21 MED ORDER — PHENYLEPHRINE HCL-NACL 20-0.9 MG/250ML-% IV SOLN
INTRAVENOUS | Status: AC
  Filled 2022-02-21: qty 500

## 2022-02-21 MED ORDER — LIDOCAINE 2% (20 MG/ML) 5 ML SYRINGE
INTRAMUSCULAR | Status: DC | PRN
  Administered 2022-02-21: 40 mg via INTRAVENOUS

## 2022-02-21 MED ORDER — FENTANYL CITRATE PF 50 MCG/ML IJ SOSY
PREFILLED_SYRINGE | INTRAMUSCULAR | Status: AC
  Administered 2022-02-21: 50 ug via INTRAVENOUS
  Filled 2022-02-21: qty 3

## 2022-02-21 MED ORDER — SODIUM CHLORIDE 0.9 % IV SOLN
INTRAVENOUS | Status: AC
  Filled 2022-02-21: qty 250

## 2022-02-21 MED ORDER — IRBESARTAN 150 MG PO TABS
300.0000 mg | ORAL_TABLET | Freq: Every day | ORAL | Status: DC
Start: 2022-02-21 — End: 2022-02-22
  Administered 2022-02-21 – 2022-02-22 (×2): 300 mg via ORAL
  Filled 2022-02-21 (×2): qty 2

## 2022-02-21 MED ORDER — ONDANSETRON HCL 4 MG/2ML IJ SOLN
INTRAMUSCULAR | Status: DC | PRN
  Administered 2022-02-21: 4 mg via INTRAVENOUS

## 2022-02-21 MED ORDER — ORAL CARE MOUTH RINSE: 15.0000 mL | Freq: Once | OROMUCOSAL | Status: AC

## 2022-02-21 MED ORDER — OXYMETAZOLINE HCL 0.05 % NA SOLN
1.0000 | Freq: Every evening | NASAL | Status: DC | PRN
  Filled 2022-02-21: qty 15

## 2022-02-21 MED ORDER — IOHEXOL 300 MG/ML  SOLN: 30.0000 mL | Freq: Once | INTRAMUSCULAR | Status: DC | PRN

## 2022-02-21 MED ORDER — CEFAZOLIN SODIUM-DEXTROSE 2-4 GM/100ML-% IV SOLN
INTRAVENOUS | Status: AC
  Filled 2022-02-21: qty 100

## 2022-02-21 MED ORDER — ACETAMINOPHEN 160 MG/5ML PO SOLN: 325.0000 mg | ORAL | Status: DC | PRN

## 2022-02-21 MED ORDER — FUROSEMIDE 20 MG PO TABS
20.0000 mg | ORAL_TABLET | Freq: Every day | ORAL | Status: DC
Start: 2022-02-22 — End: 2022-02-22
  Administered 2022-02-22: 20 mg via ORAL
  Filled 2022-02-21: qty 1

## 2022-02-21 MED ORDER — ONDANSETRON HCL 4 MG/2ML IJ SOLN: 4.0000 mg | Freq: Four times a day (QID) | INTRAMUSCULAR | Status: DC | PRN

## 2022-02-21 MED ORDER — OXYCODONE HCL 5 MG/5ML PO SOLN: 5.0000 mg | Freq: Once | ORAL | Status: AC | PRN

## 2022-02-21 MED ORDER — ACETAMINOPHEN 325 MG PO TABS: 325.0000 mg | ORAL_TABLET | ORAL | Status: DC | PRN

## 2022-02-21 MED ORDER — ROCURONIUM BROMIDE 10 MG/ML (PF) SYRINGE
PREFILLED_SYRINGE | INTRAVENOUS | Status: DC | PRN
Start: 2022-02-21 — End: 2022-02-21
  Administered 2022-02-21: 20 mg via INTRAVENOUS
  Administered 2022-02-21: 30 mg via INTRAVENOUS
  Administered 2022-02-21: 50 mg via INTRAVENOUS

## 2022-02-21 MED ORDER — HYDROCODONE-ACETAMINOPHEN 5-325 MG PO TABS
1.0000 | ORAL_TABLET | ORAL | Status: DC | PRN
  Administered 2022-02-21: 1 via ORAL
  Filled 2022-02-21: qty 1

## 2022-02-21 MED ORDER — FENTANYL CITRATE (PF) 250 MCG/5ML IJ SOLN
INTRAMUSCULAR | Status: AC
  Filled 2022-02-21: qty 5

## 2022-02-21 MED ORDER — PROMETHAZINE HCL 25 MG/ML IJ SOLN: 6.2500 mg | INTRAMUSCULAR | Status: DC | PRN

## 2022-02-21 MED ORDER — AMISULPRIDE (ANTIEMETIC) 5 MG/2ML IV SOLN: 10.0000 mg | Freq: Once | INTRAVENOUS | Status: DC | PRN

## 2022-02-21 MED ORDER — SUGAMMADEX SODIUM 200 MG/2ML IV SOLN
INTRAVENOUS | Status: DC | PRN
  Administered 2022-02-21: 300 mg via INTRAVENOUS

## 2022-02-21 MED ORDER — OXYCODONE HCL 5 MG PO TABS
ORAL_TABLET | ORAL | Status: AC
  Administered 2022-02-21: 5 mg via ORAL
  Filled 2022-02-21: qty 1

## 2022-02-21 MED ORDER — OXYCODONE HCL 5 MG PO TABS: 5.0000 mg | ORAL_TABLET | Freq: Once | ORAL | Status: AC | PRN

## 2022-02-21 NOTE — Transfer of Care (Signed)
Immediate Anesthesia Transfer of Care Note  Patient: Daniel Love  Procedure(s) Performed: CT WITH ANESTHESIA CRYOABLATION  Patient Location: PACU  Anesthesia Type:General  Level of Consciousness: awake, alert  and oriented  Airway & Oxygen Therapy: Patient Spontanous Breathing and Patient connected to face mask oxygen  Post-op Assessment: Report given to RN and Post -op Vital signs reviewed and stable  Post vital signs: Reviewed and stable  Last Vitals:  Vitals Value Taken Time  BP 146/111 02/21/22 1232  Temp    Pulse 60 02/21/22 1234  Resp 8 02/21/22 1234  SpO2 100 % 02/21/22 1234  Vitals shown include unvalidated device data.  Last Pain:  Vitals:   02/21/22 0720  TempSrc: Oral  PainSc:          Complications: No notable events documented.

## 2022-02-21 NOTE — Progress Notes (Signed)
Went to remove foley and noticed blood in the urine. Trying to contact IR on call to see if they want [place] to leave in or remove it as planned. Incision site looks good with no bruising/hematoma noted.

## 2022-02-21 NOTE — Anesthesia Procedure Notes (Signed)
Procedure Name: Intubation Date/Time: 02/21/2022 9:03 AM Performed by: Gean Maidens, CRNA Pre-anesthesia Checklist: Patient identified, Emergency Drugs available, Suction available, Patient being monitored and Timeout performed Patient Re-evaluated:Patient Re-evaluated prior to induction Oxygen Delivery Method: Circle system utilized Preoxygenation: Pre-oxygenation with 100% oxygen Induction Type: IV induction Ventilation: Mask ventilation without difficulty Laryngoscope Size: Mac and 4 Grade View: Grade I Tube type: Oral Tube size: 7.5 mm Number of attempts: 1 Airway Equipment and Method: Stylet Placement Confirmation: ETT inserted through vocal cords under direct vision, positive ETCO2 and breath sounds checked- equal and bilateral Secured at: 23 cm Tube secured with: Tape Dental Injury: Teeth and Oropharynx as per pre-operative assessment

## 2022-02-21 NOTE — Sedation Documentation (Signed)
Anesthesia in to sedate and monitor. 

## 2022-02-21 NOTE — Anesthesia Postprocedure Evaluation (Signed)
Anesthesia Post Note  Patient: Daniel Love  Procedure(s) Performed: CT WITH ANESTHESIA CRYOABLATION     Patient location during evaluation: PACU Anesthesia Type: General Level of consciousness: awake and alert Pain management: pain level controlled Vital Signs Assessment: post-procedure vital signs reviewed and stable Respiratory status: spontaneous breathing, nonlabored ventilation, respiratory function stable and patient connected to nasal cannula oxygen Cardiovascular status: blood pressure returned to baseline and stable Postop Assessment: no apparent nausea or vomiting Anesthetic complications: no   No notable events documented.  Last Vitals:  Vitals:   02/21/22 1345 02/21/22 1400  BP: 124/83 114/77  Pulse: (!) 50 (!) 49  Resp: 10 15  Temp:  36.6 C  SpO2: 100% 100%    Last Pain:  Vitals:   02/21/22 1400  TempSrc:   PainSc: 0-No pain                 Effie Berkshire

## 2022-02-21 NOTE — Progress Notes (Addendum)
Patient ID: Daniel Love, male   DOB: [birth year], 82 y.o.   MRN: [id number] Pt s/p rt renal mass bx/cryoablation earlier today; currently afebrile, BP ok; has some mild-mod rt flank discomfort; denies N/V or resp issues; puncture site rt flank clean and dry, mildly tender, concentrated yellow urine in foley bag; for overnight obs; check am labs, norco for pain

## 2022-02-21 NOTE — Procedures (Signed)
Interventional Radiology Procedure Note  Procedure: CT and US guided biopsy and cryoablation of right renal mass  Complications: None  Estimated Blood Loss: < 10 mL  Findings: Right renal mass sampled with 18 G core x 1 via 17 G needle. Cryoablation via 2 [employer] cryoablation probes. Good ice ball formation encompassing entire mass.  Plan: PACU recovery with possible discharge in late afternoon vs overnight observation.  Venetia Night. Kathlene Cote, M.D Pager:  [phone]

## 2022-02-22 ENCOUNTER — Encounter (HOSPITAL_COMMUNITY): Payer: Self-pay | Admitting: Interventional Radiology

## 2022-02-22 DIAGNOSIS — I4891 Unspecified atrial fibrillation: Secondary | ICD-10-CM | POA: Diagnosis not present

## 2022-02-22 DIAGNOSIS — K219 Gastro-esophageal reflux disease without esophagitis: Secondary | ICD-10-CM | POA: Diagnosis not present

## 2022-02-22 DIAGNOSIS — C641 Malignant neoplasm of right kidney, except renal pelvis: Secondary | ICD-10-CM | POA: Diagnosis not present

## 2022-02-22 DIAGNOSIS — Z7901 Long term (current) use of anticoagulants: Secondary | ICD-10-CM | POA: Diagnosis not present

## 2022-02-22 DIAGNOSIS — Z87891 Personal history of nicotine dependence: Secondary | ICD-10-CM | POA: Diagnosis not present

## 2022-02-22 DIAGNOSIS — I10 Essential (primary) hypertension: Secondary | ICD-10-CM | POA: Diagnosis not present

## 2022-02-22 LAB — BASIC METABOLIC PANEL
Anion gap: 4 — ABNORMAL LOW (ref 5–15)
BUN: 37 mg/dL — ABNORMAL HIGH (ref 8–23)
CO2: 28 mmol/L (ref 22–32)
Calcium: 8.5 mg/dL — ABNORMAL LOW (ref 8.9–10.3)
Chloride: 107 mmol/L (ref 98–111)
Creatinine, Ser: 1.62 mg/dL — ABNORMAL HIGH (ref 0.61–1.24)
GFR, Estimated: 42 mL/min — ABNORMAL LOW (ref >60)
Glucose, Bld: 105 mg/dL — ABNORMAL HIGH (ref 70–99)
Potassium: 4.1 mmol/L (ref 3.5–5.1)
Sodium: 139 mmol/L (ref 135–145)

## 2022-02-22 LAB — CBC WITH DIFFERENTIAL/PLATELET
Abs Immature Granulocytes: 0.02 10*3/uL (ref 0.00–0.07)
Basophils Absolute: 0 10*3/uL (ref 0.0–0.1)
Basophils Relative: 0 %
Eosinophils Absolute: 0.1 10*3/uL (ref 0.0–0.5)
Eosinophils Relative: 1 %
HCT: 39 % (ref 39.0–52.0)
Hemoglobin: 12.3 g/dL — ABNORMAL LOW (ref 13.0–17.0)
Immature Granulocytes: 0 %
Lymphocytes Relative: 9 %
Lymphs Abs: 0.7 10*3/uL (ref 0.7–4.0)
MCH: 29.8 pg (ref 26.0–34.0)
MCHC: 31.5 g/dL (ref 30.0–36.0)
MCV: 94.4 fL (ref 80.0–100.0)
Monocytes Absolute: 1.1 10*3/uL — ABNORMAL HIGH (ref 0.1–1.0)
Monocytes Relative: 14 %
Neutro Abs: 5.7 10*3/uL (ref 1.7–7.7)
Neutrophils Relative %: 76 %
Platelets: 135 10*3/uL — ABNORMAL LOW (ref 150–400)
RBC: 4.13 MIL/uL — ABNORMAL LOW (ref 4.22–5.81)
RDW: 14.9 % (ref 11.5–15.5)
WBC: 7.5 10*3/uL (ref 4.0–10.5)
nRBC: 0 % (ref 0.0–0.2)

## 2022-02-22 LAB — SURGICAL PATHOLOGY

## 2022-02-22 NOTE — Progress Notes (Signed)
°  Transition of Care [facility]) Screening Note   Patient Details  Name: Daniel Love Date of Birth: November 13, 1940   Transition of Care [facility]) CM/SW Contact:    Lennart Pall, LCSW Phone Number: 02/22/2022, 12:49 PM    Transition of Care Department [facility]) has reviewed patient and no TOC needs have been identified at this time. We will continue to monitor patient advancement through interdisciplinary progression rounds. If new patient transition needs arise, please place a TOC consult.

## 2022-02-22 NOTE — Progress Notes (Signed)
Discharge instructions discussed with patient and family, verbalized agreement and understanding 

## 2022-02-22 NOTE — Discharge Summary (Signed)
Patient ID: Daniel Love MRN: [id number] DOB/AGE: 06/11/1940 82 y.o.  Admit date: 02/21/2022 Discharge date: 02/22/2022  Supervising Physician: Aletta Edouard  Patient Status: [facility] - In-pt  Admission Diagnoses:   Discharge Diagnoses:  Principal Problem:   Right renal mass   Discharged Condition: good  Hospital Course: Patient with a history of atrial fibrillation (coumadin), HTN, prostate cancer and right renal mass presented to [facility] IR 02/21/22 for a right renal cryoablation with biopsy. This procedure was done under general anesthesia with planned overnight observation. The procedure went as expected and the patient had an uneventful night with the exception of some hematuria. The foley catheter remained overnight out of precaution. This morning the patient is sitting up in his chair with his daughter at the bedside. The urine in the foley bag is amber-colored. Order to remove foley was placed. The patient feels well and has minimal complaints. He is ready for discharge home once he has voided. He will have follow up phone calls with Dr. Kathlene Cote on 02/26/22 and again in approximately 3 weeks. An order has been placed for a scheduler from our office to call the patient with dates/times of his appointments. He was advised to avoid heavy lifting for a few days. No discharge prescriptions ordered and the patient knows he can restart his coumadin on 02/23/22. The patient and his daughter know they can call our office with any questions/concerns.   Consults: None  Significant Diagnostic Studies: DG Chest 1 View  Result Date: 02/21/2022 CLINICAL DATA:  Preoperative respiratory exam prior to renal mass cryoablation. EXAM: CHEST  1 VIEW COMPARISON:  01/23/2010 FINDINGS: The heart size and mediastinal contours are within normal limits. Stable left basilar scarring. There is no evidence of pulmonary edema, consolidation, pneumothorax, nodule or pleural fluid. The visualized skeletal structures  are unremarkable. IMPRESSION: No active disease. Electronically Signed   By: Aletta Edouard M.D.   On: 02/21/2022 08:13   CT GUIDE TISSUE ABLATION  Result Date: 02/21/2022 CLINICAL DATA:  2.8 cm right renal mass. The patient presents for planned biopsy and percutaneous cryoablation. EXAM: CT-GUIDED CORE BIOPSY OF RIGHT RENAL MASS CT-GUIDED PERCUTANEOUS CRYOABLATION OF RIGHT RENAL MASS ANESTHESIA/SEDATION: General MEDICATIONS: 2 g IV Ancef. The antibiotic was administered in an appropriate time interval prior to needle puncture of the skin. CONTRAST:  None PROCEDURE: The procedure, risks, benefits, and alternatives were explained to the patient. Questions regarding the procedure were encouraged and answered. The patient understands and consents to the procedure. A time-out was performed prior to initiating the procedure. The patient was placed under general anesthesia. Initial unenhanced CT was performed in a prone position to localize the right kidney. The right flank region was prepped with chlorhexidine in a sterile fashion, and a sterile drape was applied covering the operative field. A sterile gown and sterile gloves were used for the procedure. A 17 gauge trocar needle was advanced into the right renal mass under ultrasound guidance. Core biopsy was performed with an 18 gauge automated core biopsy device. A core sample was submitted in formalin for pathologic analysis. Under CT and ultrasound guidance, 2 separate Varian cryoablation probes were advanced into the right renal mass. Probe positioning was confirmed by CT prior to cryoablation. Cryoablation was performed through the 2 probes simultaneously. Initial 10 minute cycle of cryoablation was performed. This was followed by a 8 minute thaw cycle. A second 10 minute cycle of cryoablation was then performed. During ablation, periodic CT imaging was performed to monitor ice ball formation  and morphology. After active thaw, the cryoablation probes were  removed. COMPLICATIONS: None FINDINGS: By unenhanced CT and ultrasound, the anterior right renal mass now measures approximately 3 cm in greatest diameter. Solid tissue was obtained with biopsy. During cryoablation, CT demonstrates adequate ice ball formation encompassing the mass. There were no immediate complications. IMPRESSION: Image guided core biopsy and cryoablation of right renal mass. Electronically Signed   By: Aletta Edouard M.D.   On: 02/21/2022 13:55   CT BIOPSY  Result Date: 02/21/2022 CLINICAL DATA:  2.8 cm right renal mass. The patient presents for planned biopsy and percutaneous cryoablation. EXAM: CT-GUIDED CORE BIOPSY OF RIGHT RENAL MASS CT-GUIDED PERCUTANEOUS CRYOABLATION OF RIGHT RENAL MASS ANESTHESIA/SEDATION: General MEDICATIONS: 2 g IV Ancef. The antibiotic was administered in an appropriate time interval prior to needle puncture of the skin. CONTRAST:  None PROCEDURE: The procedure, risks, benefits, and alternatives were explained to the patient. Questions regarding the procedure were encouraged and answered. The patient understands and consents to the procedure. A time-out was performed prior to initiating the procedure. The patient was placed under general anesthesia. Initial unenhanced CT was performed in a prone position to localize the right kidney. The right flank region was prepped with chlorhexidine in a sterile fashion, and a sterile drape was applied covering the operative field. A sterile gown and sterile gloves were used for the procedure. A 17 gauge trocar needle was advanced into the right renal mass under ultrasound guidance. Core biopsy was performed with an 18 gauge automated core biopsy device. A core sample was submitted in formalin for pathologic analysis. Under CT and ultrasound guidance, 2 separate Varian cryoablation probes were advanced into the right renal mass. Probe positioning was confirmed by CT prior to cryoablation. Cryoablation was performed through the 2  probes simultaneously. Initial 10 minute cycle of cryoablation was performed. This was followed by a 8 minute thaw cycle. A second 10 minute cycle of cryoablation was then performed. During ablation, periodic CT imaging was performed to monitor ice ball formation and morphology. After active thaw, the cryoablation probes were removed. COMPLICATIONS: None FINDINGS: By unenhanced CT and ultrasound, the anterior right renal mass now measures approximately 3 cm in greatest diameter. Solid tissue was obtained with biopsy. During cryoablation, CT demonstrates adequate ice ball formation encompassing the mass. There were no immediate complications. IMPRESSION: Image guided core biopsy and cryoablation of right renal mass. Electronically Signed   By: Aletta Edouard M.D.   On: 02/21/2022 13:55    Treatments: Observation   Discharge Exam: Blood pressure 123/87, pulse 80, temperature 97.8 F (36.6 C), temperature source Oral, resp. rate 18, height 6' (1.829 m), weight 188 lb 4.4 oz (85.4 kg), SpO2 98 %. Physical Exam Constitutional:      General: He is not in acute distress. Pulmonary:     Effort: Pulmonary effort is normal.  Abdominal:     Comments: Right flank procedure site is clean and dry. No erythema, warmth or drainage. Mild tenderness to palpation. Dressing is clean and dry.   Genitourinary:    Comments: Foley still in place - order to remove. Urine is amber-colored.  Skin:    General: Skin is warm and dry.  Neurological:     Mental Status: He is alert and oriented to person, place, and time.    Disposition: Discharge disposition: 01-Home or Self Care        Allergies as of 02/22/2022       Reactions   Amlodipine Hives, Swelling  Lisinopril Cough        Medication List     TAKE these medications    acetaminophen 500 MG tablet Commonly known as: TYLENOL Take 1,000 mg by mouth every 6 (six) hours as needed (for pain.).   colchicine 0.6 MG tablet Take 0.6 mg by mouth daily  as needed (gout flare).   diphenhydramine-acetaminophen 25-500 MG Tabs tablet Commonly known as: TYLENOL PM Take 1 tablet by mouth at bedtime as needed (sleep).   furosemide 20 MG tablet Commonly known as: LASIX TAKE ONE TABLET BY MOUTH DAILY AS NEEDED What changed: when to take this   ibuprofen 200 MG tablet Commonly known as: ADVIL Take 200-400 mg by mouth every 8 (eight) hours as needed (pain.).   ICY HOT BACK EX Apply 1 application topically daily as needed (pain).   isosorbide mononitrate 30 MG 24 hr tablet Commonly known as: IMDUR Take 15 mg by mouth in the morning.   Lumigan 0.01 % Soln Generic drug: bimatoprost Place 1 drop into both eyes daily. Midday   metroNIDAZOLE 1 % gel Commonly known as: METROGEL Apply 1 application topically daily as needed (rosacea).   multivitamin with minerals Tabs tablet Take 1 tablet by mouth in the morning. Centrum Silver   olmesartan-hydrochlorothiazide 40-25 MG tablet Commonly known as: BENICAR HCT TAKE ONE TABLET BY MOUTH DAILY   Osteo Bi-Flex One Per Day Tabs Take 1 tablet by mouth in the morning.   oxymetazoline 0.05 % nasal spray Commonly known as: AFRIN Place 1 spray into both nostrils at bedtime as needed for congestion.   timolol 0.5 % ophthalmic solution Commonly known as: TIMOPTIC 1 drop 2 (two) times daily.   warfarin 5 MG tablet Commonly known as: COUMADIN Take as directed. If you are unsure how to take this medication, talk to your nurse or doctor. OK to resume this medication 02/23/22 Original instructions: TAKE 1 TO 1 AND 1/2 TABLET BY MOUTH DAILY AS INSTRUCTED BY COUMADIN CLINIC What changed:  how much to take how to take this when to take this additional instructions        Follow-up [place] Follow up.   Why: Tele-visit follow ups with Dr. Kathlene Cote 02/26/22 and again in approximately 3 weeks. A scheduler from our office will call you with the dates/times  of your appointments. Please call our office with any questions/concerns. Contact information: [place] [address] [phone]                  Electronically Signed: Theresa Duty, NP 02/22/2022, 11:12 AM   I have spent Less Than 30 Minutes discharging Daniel Love.

## 2022-02-22 NOTE — Progress Notes (Signed)
On-Call MD Laurence Ferrari) made aware of blood in urine and current order to d/c foley. Order given to leave foley in place.

## 2022-02-28 ENCOUNTER — Ambulatory Visit (INDEPENDENT_AMBULATORY_CARE_PROVIDER_SITE_OTHER): Payer: Medicare Other

## 2022-02-28 ENCOUNTER — Other Ambulatory Visit: Payer: Self-pay

## 2022-02-28 DIAGNOSIS — Z7901 Long term (current) use of anticoagulants: Secondary | ICD-10-CM

## 2022-02-28 DIAGNOSIS — I4821 Permanent atrial fibrillation: Secondary | ICD-10-CM

## 2022-02-28 LAB — POCT INR: INR: 1.2 — AB (ref 2.0–3.0)

## 2022-02-28 NOTE — Patient Instructions (Signed)
Description   ?Take an extra tablet today (already taken 1 tablet) and 1.5 tablets tomorrow and the continue taking Warfarin 1 tablet daily.  ?Recheck INR 1 week.  ?Call us with any update or changes [phone].  ?  ?   ?

## 2022-03-07 ENCOUNTER — Other Ambulatory Visit: Payer: Self-pay

## 2022-03-07 ENCOUNTER — Ambulatory Visit (INDEPENDENT_AMBULATORY_CARE_PROVIDER_SITE_OTHER): Payer: Medicare Other

## 2022-03-07 DIAGNOSIS — Z7901 Long term (current) use of anticoagulants: Secondary | ICD-10-CM | POA: Diagnosis not present

## 2022-03-07 DIAGNOSIS — I4821 Permanent atrial fibrillation: Secondary | ICD-10-CM | POA: Diagnosis not present

## 2022-03-07 LAB — POCT INR: INR: 1.9 — AB (ref 2.0–3.0)

## 2022-03-07 NOTE — Patient Instructions (Signed)
Description   ?Take 1.5 tablets today and then continue taking Warfarin 1 tablet daily.  ?Recheck INR 2 weeks.  ?Call us with any update or changes [phone].  ?  ?   ?

## 2022-03-12 ENCOUNTER — Other Ambulatory Visit: Payer: Self-pay

## 2022-03-12 MED ORDER — WARFARIN SODIUM 5 MG PO TABS: ORAL_TABLET | ORAL | 1 refills | Status: DC

## 2022-03-21 ENCOUNTER — Other Ambulatory Visit: Payer: Self-pay

## 2022-03-21 ENCOUNTER — Ambulatory Visit (INDEPENDENT_AMBULATORY_CARE_PROVIDER_SITE_OTHER): Payer: Medicare Other

## 2022-03-21 ENCOUNTER — Telehealth: Payer: Medicare Other

## 2022-03-21 DIAGNOSIS — I4821 Permanent atrial fibrillation: Secondary | ICD-10-CM | POA: Diagnosis not present

## 2022-03-21 DIAGNOSIS — Z7901 Long term (current) use of anticoagulants: Secondary | ICD-10-CM

## 2022-03-21 LAB — POCT INR: INR: 2 (ref 2.0–3.0)

## 2022-03-21 NOTE — Patient Instructions (Signed)
Description   ?Take 1.5 tablets today and then continue taking Warfarin 1 tablet daily.  ?Recheck INR 3 weeks.  ?Call us with any update or changes [phone].  ?  ?   ?

## 2022-03-27 DIAGNOSIS — D3000 Benign neoplasm of unspecified kidney: Secondary | ICD-10-CM | POA: Diagnosis not present

## 2022-03-27 DIAGNOSIS — Z8546 Personal history of malignant neoplasm of prostate: Secondary | ICD-10-CM | POA: Diagnosis not present

## 2022-03-30 DIAGNOSIS — Z9889 Other specified postprocedural states: Secondary | ICD-10-CM | POA: Diagnosis not present

## 2022-03-30 DIAGNOSIS — N281 Cyst of kidney, acquired: Secondary | ICD-10-CM | POA: Diagnosis not present

## 2022-03-30 DIAGNOSIS — N400 Enlarged prostate without lower urinary tract symptoms: Secondary | ICD-10-CM | POA: Diagnosis not present

## 2022-03-30 DIAGNOSIS — C641 Malignant neoplasm of right kidney, except renal pelvis: Secondary | ICD-10-CM | POA: Diagnosis not present

## 2022-03-30 DIAGNOSIS — D3001 Benign neoplasm of right kidney: Secondary | ICD-10-CM | POA: Diagnosis not present

## 2022-04-03 DIAGNOSIS — D3000 Benign neoplasm of unspecified kidney: Secondary | ICD-10-CM | POA: Diagnosis not present

## 2022-04-03 DIAGNOSIS — Z8546 Personal history of malignant neoplasm of prostate: Secondary | ICD-10-CM | POA: Diagnosis not present

## 2022-04-11 ENCOUNTER — Ambulatory Visit (HOSPITAL_COMMUNITY): Payer: Medicare Other | Attending: Cardiovascular Disease

## 2022-04-11 ENCOUNTER — Ambulatory Visit (INDEPENDENT_AMBULATORY_CARE_PROVIDER_SITE_OTHER): Payer: Medicare Other | Admitting: *Deleted

## 2022-04-11 DIAGNOSIS — Z7901 Long term (current) use of anticoagulants: Secondary | ICD-10-CM | POA: Diagnosis not present

## 2022-04-11 DIAGNOSIS — I4821 Permanent atrial fibrillation: Secondary | ICD-10-CM

## 2022-04-11 DIAGNOSIS — Z5181 Encounter for therapeutic drug level monitoring: Secondary | ICD-10-CM | POA: Diagnosis not present

## 2022-04-11 DIAGNOSIS — I340 Nonrheumatic mitral (valve) insufficiency: Secondary | ICD-10-CM | POA: Diagnosis not present

## 2022-04-11 DIAGNOSIS — I080 Rheumatic disorders of both mitral and aortic valves: Secondary | ICD-10-CM | POA: Insufficient documentation

## 2022-04-11 DIAGNOSIS — I351 Nonrheumatic aortic (valve) insufficiency: Secondary | ICD-10-CM | POA: Diagnosis not present

## 2022-04-11 LAB — ECHOCARDIOGRAM COMPLETE
MV M vel: 4.68 m/s
MV Peak grad: 87.6 mmHg
P 1/2 time: 557 msec
Radius: 0.7 cm
S' Lateral: 3.5 cm

## 2022-04-11 LAB — POCT INR: INR: 2.5 (ref 2.0–3.0)

## 2022-04-11 NOTE — Patient Instructions (Signed)
Description   ?Continue taking Warfarin 1 tablet daily.  ?Recheck INR 4 weeks.  ?Call us with any update or changes [phone].  ?  ? ? ?

## 2022-04-18 ENCOUNTER — Other Ambulatory Visit: Payer: Self-pay | Admitting: Urology

## 2022-04-18 ENCOUNTER — Other Ambulatory Visit: Payer: Self-pay | Admitting: Interventional Radiology

## 2022-04-18 DIAGNOSIS — N2889 Other specified disorders of kidney and ureter: Secondary | ICD-10-CM

## 2022-04-30 ENCOUNTER — Telehealth: Payer: Medicare Other

## 2022-05-09 ENCOUNTER — Ambulatory Visit (INDEPENDENT_AMBULATORY_CARE_PROVIDER_SITE_OTHER): Payer: Medicare Other

## 2022-05-09 DIAGNOSIS — I4821 Permanent atrial fibrillation: Secondary | ICD-10-CM

## 2022-05-09 DIAGNOSIS — Z7901 Long term (current) use of anticoagulants: Secondary | ICD-10-CM | POA: Diagnosis not present

## 2022-05-09 LAB — POCT INR: INR: 2.2 (ref 2.0–3.0)

## 2022-05-09 NOTE — Patient Instructions (Signed)
Description   ?Continue taking Warfarin 1 tablet daily.  ?Recheck INR 5 weeks.  ?Call us with any update or changes [phone].  ?  ?   ?

## 2022-06-04 ENCOUNTER — Ambulatory Visit
Admission: RE | Admit: 2022-06-04 | Discharge: 2022-06-04 | Disposition: A | Discharge status: Home / Self Care | Payer: Medicare Other | Source: Ambulatory Visit | Attending: Interventional Radiology | Admitting: Interventional Radiology

## 2022-06-04 DIAGNOSIS — D3001 Benign neoplasm of right kidney: Secondary | ICD-10-CM | POA: Diagnosis not present

## 2022-06-04 DIAGNOSIS — N2889 Other specified disorders of kidney and ureter: Secondary | ICD-10-CM

## 2022-06-04 DIAGNOSIS — Z9889 Other specified postprocedural states: Secondary | ICD-10-CM | POA: Diagnosis not present

## 2022-06-04 HISTORY — PX: IR RADIOLOGIST EVAL & MGMT: IMG5224

## 2022-06-04 NOTE — Progress Notes (Signed)
Chief Complaint: Patient was consulted remotely today (TeleHealth) for follow-up after biopsy and cryoablation of a 2.8 cm right renal mass on 02/21/2022.  History of Present Illness: Daniel Love is a 82 y.o. male status post biopsy and cryoablation of a 2.8 cm right renal mass on 02/21/2022.  Biopsy demonstrated an oncocytic neoplasm without malignant cells visualized.  He did well after the procedure and currently has no complaints.  He denies any pain or urinary symptoms.  Past Medical History:  Diagnosis Date   Anal fissure    hx of    Arthritis    Atrial fibrillation ([place])    maintaining normal sinus rhythm   Chronic anticoagulation    coumadin   Dyspepsia    Dysrhythmia    Afib   GERD (gastroesophageal reflux disease)    Glaucoma    History of colon polyps 2012   Colonoscopy-Dr. Benson [place]    History of echocardiogram    Echocardiogram 2/19: EF 60-65, normal wall motion, trivial AI, moderate to severe MR, mild LAE, mild RAE, mild to moderate TR, PASP 35   History of kidney stones    History of nuclear stress test    Myoview 2/19: EF 42, diffuse HK no significant ischemia, apical lateral scar   Hypertension    Prostate cancer ([place])    Skin cancer    skin cancer removed from left arm and back    Past Surgical History:  Procedure Laterality Date   BACK SURGERY  12/31/1977   for degenerative disc disease   COLONOSCOPY     IR RADIOLOGIST EVAL & MGMT  01/16/2022   PROSTATE BIOPSY     RADIOLOGY WITH ANESTHESIA N/A 02/21/2022   Procedure: CT WITH ANESTHESIA CRYOABLATION;  Surgeon: Aletta Edouard, MD;  Location: [facility] ORS;  Service: Radiology;  Laterality: N/A;   TEE WITHOUT CARDIOVERSION N/A 03/04/2019   Procedure: TRANSESOPHAGEAL ECHOCARDIOGRAM (TEE);  Surgeon: Acie Fredrickson Wonda Cheng, MD;  Location: [facility] ENDOSCOPY;  Service: Cardiovascular;  Laterality: N/A;   toe removed     Right second toe   UPPER GASTROINTESTINAL ENDOSCOPY      Allergies: Amlodipine and  Lisinopril  Medications: Prior to Admission medications   Medication Sig Start Date End Date Taking? Authorizing Provider  acetaminophen (TYLENOL) 500 MG tablet Take 1,000 mg by mouth every 6 (six) hours as needed (for pain.).    [provider]  Boswellia-Glucosamine-Vit D (OSTEO BI-FLEX ONE PER DAY) TABS Take 1 tablet by mouth in the morning.    [provider]  colchicine 0.6 MG tablet Take 0.6 mg by mouth daily as needed (gout flare). 07/30/21   [provider]  diphenhydramine-acetaminophen (TYLENOL PM) 25-500 MG TABS tablet Take 1 tablet by mouth at bedtime as needed (sleep).    [provider]  furosemide (LASIX) 20 MG tablet TAKE ONE TABLET BY MOUTH DAILY AS NEEDED Patient taking differently: Take 20 mg by mouth in the morning. 10/02/21   Chandrasekhar, Lyda Kalata A, MD  ibuprofen (ADVIL) 200 MG tablet Take 200-400 mg by mouth every 8 (eight) hours as needed (pain.).    [provider]  isosorbide mononitrate (IMDUR) 30 MG 24 hr tablet Take 15 mg by mouth in the morning. 10/13/18   [provider]  LUMIGAN 0.01 % SOLN Place 1 drop into both eyes daily. Midday 06/13/16   [provider]  Menthol, Topical Analgesic, (ICY HOT BACK EX) Apply 1 application topically daily as needed (pain).    [provider]  Multiple  Vitamin (MULTIVITAMIN WITH MINERALS) TABS tablet Take 1 tablet by mouth in the morning. Centrum Silver    [provider]  olmesartan-hydrochlorothiazide (BENICAR HCT) 40-25 MG tablet TAKE ONE TABLET BY MOUTH DAILY 06/05/21   Nahser, Wonda Cheng, MD  oxymetazoline (AFRIN) 0.05 % nasal spray Place 1 spray into both nostrils at bedtime as needed for congestion.    [provider]  timolol (TIMOPTIC) 0.5 % ophthalmic solution 1 drop 2 (two) times daily. 03/07/20   [provider]  warfarin (COUMADIN) 5 MG tablet TAKE 1 TO 1.5 TABLETS BY MOUTH DAILY AS INSTRUCTED BY COUMADIN CLINIC 03/12/22    Werner Lean, MD     Family History  Problem Relation Age of Onset   Heart failure Father    Cancer Father        associated with her lungs   Heart attack Father    Cancer Mother        uncertain/possibly lung   Colon cancer Maternal Grandmother    Stroke Neg Hx    Esophageal cancer Neg Hx    Rectal cancer Neg Hx    Stomach cancer Neg Hx     Social History   Socioeconomic History   Marital status: Married    Spouse name: Not on file   Number of children: 3   Years of education: Not on file   Highest education level: Not on file  Occupational History   Occupation: Retired  Tobacco Use   Smoking status: Former    Packs/day: 1.00    Years: 7.00    Pack years: 7.00    Types: Cigarettes    Quit date: 12/31/1966    Years since quitting: 55.4   Smokeless tobacco: Never  Vaping Use   Vaping Use: Never used  Substance and Sexual Activity   Alcohol use: Yes    Alcohol/week: 7.0 standard drinks    Types: 7 Glasses of wine per week    Comment: 1 weekly   Drug use: No   Sexual activity: Not Currently  Other Topics Concern   Not on file  Social History Narrative   Patient is the primary care giver for his wife who is disabled. They share three daughters, two live local, one in [place]   09-26-18 Unable to ask abuse questions wife and daughter with him today.   Social Determinants of Health   Financial Resource Strain: Not on file  Food Insecurity: Not on file  Transportation Needs: Not on file  Physical Activity: Not on file  Stress: Not on file  Social Connections: Not on file     Review of Systems  Constitutional: Negative.   Respiratory: Negative.    Cardiovascular: Negative.   Gastrointestinal: Negative.   Genitourinary: Negative.   Musculoskeletal: Negative.   Neurological: Negative.    Review of Systems: A 12 point ROS discussed and pertinent positives are indicated in the HPI above.  All other systems are negative.  Physical Exam No  direct physical exam was performed (except for noted visual exam findings with Video Visits).    Vital Signs: There were no vitals taken for this visit.  Imaging: No results found.  Labs:  CBC: Recent Labs    02/01/22 1120 02/22/22 0443  WBC 4.9 7.5  HGB 12.9* 12.3*  HCT 40.0 39.0  PLT 150 135*    COAGS: Recent Labs    03/07/22 1109 03/21/22 1136 04/11/22 1139 05/09/22 1109  INR 1.9* 2.0 2.5 2.2    BMP: Recent Labs  02/01/22 1120 02/22/22 0443  NA 141 139  K 4.4 4.1  CL 108 107  CO2 29 28  GLUCOSE 77 105*  BUN 34* 37*  CALCIUM 9.3 8.5*  CREATININE 1.11 1.62*  GFRNONAA >60 42*     Assessment and Plan:  I spoke with Daniel Love over the phone and reviewed findings of a follow-up CT of the abdomen performed on 03/30/2022 at [facility].  This demonstrates a well-circumscribed ablation defect at the anterior upper pole of the right kidney demonstrating no enhancement and completely encompassing the region of previously enhancing tumor.  There is no evidence of complication following ablation.  I recommended a follow-up scan in 1 year.  Given biopsy results of an oncocytic neoplasm, postprocedural follow-up likely would only have to be performed for roughly 3 years rather than 5 years for a definite carcinoma.   Electronically Signed: Azzie Roup 06/04/2022, 11:15 AM    I spent a total of  10 Minutes in remote  clinical consultation, greater than 50% of which was counseling/coordinating care post cryoablation of a right oncocytic renal neoplasm.    Visit type: Audio only (telephone). Audio (no video) only due to patient's lack of internet/smartphone capability. Alternative for in-person consultation at [facility], [address] [place], [place], [place]. This visit type was conducted due to national recommendations for restrictions regarding the COVID-19 Pandemic (e.g. social distancing).  This format is felt to be most appropriate for this  patient at this time.  All issues noted in this document were discussed and addressed.

## 2022-06-09 ENCOUNTER — Other Ambulatory Visit: Payer: Self-pay | Admitting: Cardiovascular Disease

## 2022-06-13 ENCOUNTER — Ambulatory Visit (INDEPENDENT_AMBULATORY_CARE_PROVIDER_SITE_OTHER): Payer: Medicare Other | Admitting: *Deleted

## 2022-06-13 DIAGNOSIS — I4821 Permanent atrial fibrillation: Secondary | ICD-10-CM

## 2022-06-13 DIAGNOSIS — Z7901 Long term (current) use of anticoagulants: Secondary | ICD-10-CM | POA: Diagnosis not present

## 2022-06-13 LAB — POCT INR: INR: 2.4 (ref 2.0–3.0)

## 2022-06-13 NOTE — Patient Instructions (Signed)
Description   Continue taking Warfarin 1 tablet daily. Recheck INR 6 weeks. Call us with any update or changes #[phone].       

## 2022-07-19 DIAGNOSIS — L812 Freckles: Secondary | ICD-10-CM | POA: Diagnosis not present

## 2022-07-19 DIAGNOSIS — L570 Actinic keratosis: Secondary | ICD-10-CM | POA: Diagnosis not present

## 2022-07-19 DIAGNOSIS — L821 Other seborrheic keratosis: Secondary | ICD-10-CM | POA: Diagnosis not present

## 2022-07-19 DIAGNOSIS — Z85828 Personal history of other malignant neoplasm of skin: Secondary | ICD-10-CM | POA: Diagnosis not present

## 2022-07-19 DIAGNOSIS — D1801 Hemangioma of skin and subcutaneous tissue: Secondary | ICD-10-CM | POA: Diagnosis not present

## 2022-07-19 DIAGNOSIS — L853 Xerosis cutis: Secondary | ICD-10-CM | POA: Diagnosis not present

## 2022-07-23 NOTE — Progress Notes (Unsigned)
Cardiology Office Note:    Date:  07/26/2022   ID:  Daniel Love, DOB 08-14-1940, MRN [id number]  PCP:  Chesley Noon, MD   [place]  Cardiologist:   Rudean Haskell MD Advanced Practice Provider:  No care team member to display Electrophysiologist:  None   Referring MD: Chesley Noon, MD   CC:  Follow up post surgery  History of Present Illness:    Daniel Love is a 82 y.o. male with a hx of HTN Permanent atrial fibrillation with moderate atrial functional MR, Moderate aortic aneurysm (4.5 cm-> Ascending Aortic Heigh Index) seen by [name] who presented to establish care.  Since last vist he had an echo with improved LVEF and stable Aortic size.   2023: pre-op for renal mass cryoablation.   Patient notes that he is doing well.   No issues after procedure. Notes that he did a trial off diuretic and had SOB and leg swelling.  Notes that he is fairly sedentary but with no symptoms.  Has being walking up and down steps and went on a walk today, with a little bit of shortness of breath.  Biggest limitation is leg pain. No chest pain or pressure.  No SOB/DOE and no PND/Orthopnea.  Notes weight gain and leg swelling.  No palpitations or syncope .   Past Medical History:  Diagnosis Date   Anal fissure    hx of    Arthritis    Atrial fibrillation ([place])    maintaining normal sinus rhythm   Chronic anticoagulation    coumadin   Dyspepsia    Dysrhythmia    Afib   GERD (gastroesophageal reflux disease)    Glaucoma    History of colon polyps 2012   Colonoscopy-Dr. Benson [place]    History of echocardiogram    Echocardiogram 2/19: EF 60-65, normal wall motion, trivial AI, moderate to severe MR, mild LAE, mild RAE, mild to moderate TR, PASP 35   History of kidney stones    History of nuclear stress test    Myoview 2/19: EF 42, diffuse HK no significant ischemia, apical lateral scar   Hypertension    Prostate cancer ([place])    Skin cancer     skin cancer removed from left arm and back    Past Surgical History:  Procedure Laterality Date   BACK SURGERY  12/31/1977   for degenerative disc disease   COLONOSCOPY     IR RADIOLOGIST EVAL & MGMT  01/16/2022   IR RADIOLOGIST EVAL & MGMT  06/04/2022   PROSTATE BIOPSY     RADIOLOGY WITH ANESTHESIA N/A 02/21/2022   Procedure: CT WITH ANESTHESIA CRYOABLATION;  Surgeon: Aletta Edouard, MD;  Location: [facility];  Service: Radiology;  Laterality: N/A;   TEE WITHOUT CARDIOVERSION N/A 03/04/2019   Procedure: TRANSESOPHAGEAL ECHOCARDIOGRAM (TEE);  Surgeon: Thayer Headings, MD;  Location: [facility] ENDOSCOPY;  Service: Cardiovascular;  Laterality: N/A;   toe removed     Right second toe   UPPER GASTROINTESTINAL ENDOSCOPY      Current Medications: Current Meds  Medication Sig   acetaminophen (TYLENOL) 500 MG tablet Take 1,000 mg by mouth every 6 (six) hours as needed (for pain.).   Boswellia-Glucosamine-Vit D (OSTEO BI-FLEX ONE PER DAY) TABS Take 1 tablet by mouth in the morning.   colchicine 0.6 MG tablet Take 0.6 mg by mouth daily as needed (gout flare).   diphenhydramine-acetaminophen (TYLENOL PM) 25-500 MG TABS tablet Take 1 tablet by mouth at  bedtime as needed (sleep).   furosemide (LASIX) 40 MG tablet Take 1 tablet (40 mg total) by mouth daily.   ibuprofen (ADVIL) 200 MG tablet Take 200-400 mg by mouth every 8 (eight) hours as needed (pain.).   isosorbide mononitrate (IMDUR) 30 MG 24 hr tablet Take 15 mg by mouth in the morning.   LUMIGAN 0.01 % SOLN Place 1 drop into both eyes daily. Midday   Menthol, Topical Analgesic, (ICY HOT BACK EX) Apply 1 application topically daily as needed (pain).   Multiple Vitamin (MULTIVITAMIN WITH MINERALS) TABS tablet Take 1 tablet by mouth in the morning. Centrum Silver   olmesartan-hydrochlorothiazide (BENICAR HCT) 40-25 MG tablet Take 1 tablet by mouth daily. Please keep upcoming appointment for future refills. Thank you.   oxymetazoline (AFRIN) 0.05 %  nasal spray Place 1 spray into both nostrils at bedtime as needed for congestion.   timolol (TIMOPTIC) 0.5 % ophthalmic solution 1 drop 2 (two) times daily.   warfarin (COUMADIN) 5 MG tablet TAKE 1 TO 1.5 TABLETS BY MOUTH DAILY AS INSTRUCTED BY COUMADIN CLINIC   [DISCONTINUED] furosemide (LASIX) 20 MG tablet Take 20 mg by mouth every morning.     Allergies:   Amlodipine and Lisinopril   Social History   Socioeconomic History   Marital status: Married    Spouse name: Not on file   Number of children: 3   Years of education: Not on file   Highest education level: Not on file  Occupational History   Occupation: Retired  Tobacco Use   Smoking status: Former    Packs/day: 1.00    Years: 7.00    Total pack years: 7.00    Types: Cigarettes    Quit date: 12/31/1966    Years since quitting: 55.6   Smokeless tobacco: Never  Vaping Use   Vaping Use: Never used  Substance and Sexual Activity   Alcohol use: Yes    Alcohol/week: 7.0 standard drinks of alcohol    Types: 7 Glasses of wine per week    Comment: 1 weekly   Drug use: No   Sexual activity: Not Currently  Other Topics Concern   Not on file  Social History Narrative   Patient is the primary care giver for his wife who is disabled. They share three daughters, two live local, one in [place]   09-26-18 Unable to ask abuse questions wife and daughter with him today.   Social Determinants of Health   Financial Resource Strain: Not on file  Food Insecurity: Not on file  Transportation Needs: Not on file  Physical Activity: Not on file  Stress: Not on file  Social Connections: Not on file    Social: married and his wife has dementia, now comes with daugther  Family History: The patient's family history includes Cancer in his father and mother; Colon cancer in his maternal grandmother; Heart attack in his father; Heart failure in his father. There is no history of Stroke, Esophageal cancer, Rectal cancer, or Stomach  cancer.  ROS:   Please see the history of present illness.     All other systems reviewed and are negative.  EKGs/Labs/Other Studies Reviewed:    The following studies were reviewed today:  EKG:   09/27/21: AF rate 57 SVR 09/19/20:  AF rate 54 with slow ventricular response  Transthoracic Echocardiogram: Date: 04/19/21 Results:  1. Left ventricular ejection fraction, by estimation, is 50 to 55%. The  left ventricle has low normal function. The left ventricle demonstrates  global  hypokinesis. Left ventricular diastolic function could not be  evaluated.   2. Right ventricular systolic function is mildly reduced. The right  ventricular size is moderately enlarged. There is moderately elevated  pulmonary artery systolic pressure. The estimated right ventricular  systolic pressure is 85.8 mmHg.   3. Left atrial size was severely dilated.   4. Right atrial size was severely dilated.   5. The mitral valve is myxomatous. Moderate mitral valve regurgitation.  There is mild late systolic prolapse of both leaflets of the mitral valve.   6. The tricuspid valve is myxomatous. Tricuspid valve regurgitation is  moderate.   7. The aortic valve is tricuspid. Aortic valve regurgitation is mild.  Mild aortic valve sclerosis is present, with no evidence of aortic valve  stenosis.   8. Aortic dilatation noted. There is mild dilatation of the aortic root,  measuring 44 mm. There is mild dilatation of the ascending aorta,  measuring 43 mm.   9. The inferior vena cava is dilated in size with <50% respiratory  variability, suggesting right atrial pressure of 15 mmHg.   CT Aorta: Date:02/22/2021 Results: IMPRESSION: 1. Stable prominence of the ascending thoracic aorta measuring 4.5 x 4.5 cm. Other thoracic aortic measurements as noted. No evident dissection. Ascending thoracic aortic aneurysm. Recommend semi-annual imaging followup by CTA or MRA and referral to cardiothoracic surgery if not already  obtained. This recommendation follows 2010 ACCF/AHA/AATS/ACR/ASA/SCA/SCAI/SIR/STS/SVM Guidelines for the Diagnosis and Management of Patients With Thoracic Aortic Disease. Circulation. 2010; 121: I502-D741. Aortic aneurysm NOS (ICD10-I71.9). Scattered foci of coronary artery calcification noted.   2.  No evident pulmonary embolus.   3. Occasional calcified granulomas. Areas of atelectatic change, stable. No edema or airspace opacity.   4.  No evident adenopathy.  NM Stress Testing : Date:02/17/2018 Results: Nuclear stress EF: 42%. There was no ST segment deviation noted during stress. This is an intermediate risk study. The left ventricular ejection fraction is moderately decreased (30-44%).   1. EF 42%, diffuse hypokinesis.  2. Fixed small, mild apical lateral perfusion defect.  Possible prior infarction, no evidence for significant ischemia.     Recent Labs: 02/22/2022: BUN 37; Creatinine, Ser 1.62; Hemoglobin 12.3; Platelets 135; Potassium 4.1; Sodium 139  Recent Lipid Panel No results found for: "CHOL", "TRIG", "HDL", "CHOLHDL", "VLDL", "LDLCALC", "LDLDIRECT"   Risk Assessment/Calculations:    CHA2DS2-VASc Score = 5  This indicates a 7.2% annual risk of stroke. The patient's score is based upon: CHF History: 1 HTN History: 1 Diabetes History: 0 Stroke History: 0 Vascular Disease History: 1 Age Score: 2 Gender Score: 0       Physical Exam:    VS:  BP 116/70   Pulse 61   Ht '5\' 11"'$  (1.803 m)   Wt 191 lb 9.6 oz (86.9 kg)   SpO2 96%   BMI 26.72 kg/m     Wt Readings from Last 3 Encounters:  07/26/22 191 lb 9.6 oz (86.9 kg)  02/21/22 188 lb 4.4 oz (85.4 kg)  02/01/22 188 lb 6 oz (85.4 kg)    Gen: No distress   Neck: No JVD Cardiac: No Rubs or Gallops, no murmur despite echo with notable MR and TR , IRIR +2 radial pulses Respiratory: Clear to auscultation bilaterally, normal effort, normal  respiratory rate GI: Soft, nontender, non-distended  MS: +2  edema R left, +1 edema left leg;  moves all extremities Integument: Skin feels warm Neuro:  At time of evaluation, alert and oriented to person/place/time/situation  Psych: Normal affect,  patient feels ok  ASSESSMENT:    1. Mitral valve insufficiency, unspecified etiology   2. Mitral regurgitation and aortic stenosis   3. Permanent atrial fibrillation ([place])   4. Nonrheumatic tricuspid valve regurgitation     PLAN:    Moderate to Severe Mitral Regurgitation- Atrial Functional MR with Prolapse Moderate to Severe Tricuspid Regurgitation- atrial functional HFmrEF EF ~ 50% - AKI has resolved - NYHA class I, Stage B, euvolemic, etiology from AF though no prior ischemic tseting - Diuretic regimen: lasix 20 mg PO daily, he takes  25 mg HCTZ combination-> increase to lasix 40 mg PO daily and BMP and BNP in one week, may need K - compression stockings - Likely would not tolerate BB due to baseline bradycardia - Continue Olmesartan 40 mg - continue Imdur  We will repeat an echo in 5 months and I will see him after, low threshold for TEE and LHC given his TAA and multiple valve disease; he is reasonably function and may be a surgical candidate in the future - Consented for TEE  Permanent Atrial Fibrillation - Risk factors include Age X2 and HF, HTN - CHADSVASC=4.  - Continue anticoagulation with coumadin.  - asymptomatic  Asymptomatic thoracic aortic aneurysm - Last at 4.5 - Last scan 02/22/21-> planned for CT Aorta with [name] 2024   Time Spent Directly with Patient:   I have spent a total of 50 minutes with the patient reviewing notes, imaging, EKGs, labs and examining the patient as well as establishing an assessment and plan that was discussed personally with the patient.  > 50% of time was spent in direct patient care and family and reviewing imaging with patient.    Medication Adjustments/Labs and Tests Ordered: Current medicines are reviewed at length with the patient  today.  Concerns regarding medicines are outlined above.  Orders Placed This Encounter  Procedures   Basic metabolic panel   Pro b natriuretic peptide (BNP)   ECHOCARDIOGRAM COMPLETE    Meds ordered this encounter  Medications   furosemide (LASIX) 40 MG tablet    Sig: Take 1 tablet (40 mg total) by mouth daily.    Dispense:  90 tablet    Refill:  3     Patient Instructions  Medication Instructions:  Your physician has recommended you make the following change in your medication:  INCREASE: furosemide(Lasix) to 40 mg by mouth once daily  *If you need a refill on your cardiac medications before your next appointment, please call your pharmacy*   Lab Work: IN 2 WEEKS: BNP, BMP If you have labs (blood work) drawn today and your tests are completely normal, you will receive your results only by: [place] (if you have MyChart) OR A paper copy in the mail If you have any lab test that is abnormal or we need to change your treatment, we will call you to review the results.   Testing/Procedures: Nov- Dec 2023- Your physician has requested that you have an echocardiogram. Echocardiography is a painless test that uses sound waves to create images of your heart. It provides your doctor with information about the size and shape of your heart and how well your heart's chambers and valves are working. This procedure takes approximately one hour. There are no restrictions for this procedure.    Follow-Up: At [facility], you and your health needs are our priority.  As part of our continuing mission to provide you with exceptional heart care, we have created designated Provider Care Teams.  These Care Teams include your primary Cardiologist (physician) and Advanced Practice Providers (APPs -  Physician Assistants and Nurse Practitioners) who all work together to provide you with the care you need, when you need it.  Your next appointment:   5 month(s)  The format for your next  appointment:   In Person  Provider: Rudean Haskell, MD  Important Information About Sugar         Signed, Werner Lean, MD  07/26/2022 9:24 AM    [place]

## 2022-07-25 ENCOUNTER — Ambulatory Visit (INDEPENDENT_AMBULATORY_CARE_PROVIDER_SITE_OTHER): Payer: Medicare Other

## 2022-07-25 DIAGNOSIS — Z7901 Long term (current) use of anticoagulants: Secondary | ICD-10-CM

## 2022-07-25 DIAGNOSIS — I4821 Permanent atrial fibrillation: Secondary | ICD-10-CM | POA: Diagnosis not present

## 2022-07-25 LAB — POCT INR: INR: 2.1 (ref 2.0–3.0)

## 2022-07-25 NOTE — Patient Instructions (Signed)
Description   Continue taking Warfarin 1 tablet daily. Recheck INR 6 weeks. Call us with any update or changes [phone].

## 2022-07-26 ENCOUNTER — Ambulatory Visit (INDEPENDENT_AMBULATORY_CARE_PROVIDER_SITE_OTHER): Payer: Medicare Other | Admitting: Internal Medicine

## 2022-07-26 ENCOUNTER — Encounter: Payer: Self-pay | Admitting: Internal Medicine

## 2022-07-26 VITALS — BP 116/70 | HR 61 | Ht 71.0 in | Wt 191.6 lb

## 2022-07-26 DIAGNOSIS — I080 Rheumatic disorders of both mitral and aortic valves: Secondary | ICD-10-CM | POA: Insufficient documentation

## 2022-07-26 DIAGNOSIS — I361 Nonrheumatic tricuspid (valve) insufficiency: Secondary | ICD-10-CM

## 2022-07-26 DIAGNOSIS — I340 Nonrheumatic mitral (valve) insufficiency: Secondary | ICD-10-CM

## 2022-07-26 DIAGNOSIS — I4821 Permanent atrial fibrillation: Secondary | ICD-10-CM | POA: Diagnosis not present

## 2022-07-26 MED ORDER — FUROSEMIDE 40 MG PO TABS: 40.0000 mg | ORAL_TABLET | Freq: Every day | ORAL | 3 refills | Status: DC

## 2022-07-26 NOTE — Patient Instructions (Signed)
Medication Instructions:  Your physician has recommended you make the following change in your medication:  INCREASE: furosemide(Lasix) to 40 mg by mouth once daily  *If you need a refill on your cardiac medications before your next appointment, please call your pharmacy*   Lab Work: IN 2 WEEKS: BNP, BMP If you have labs (blood work) drawn today and your tests are completely normal, you will receive your results only by: [place] (if you have MyChart) OR A paper copy in the mail If you have any lab test that is abnormal or we need to change your treatment, we will call you to review the results.   Testing/Procedures: Nov- Dec 2023- Your physician has requested that you have an echocardiogram. Echocardiography is a painless test that uses sound waves to create images of your heart. It provides your doctor with information about the size and shape of your heart and how well your heart's chambers and valves are working. This procedure takes approximately one hour. There are no restrictions for this procedure.    Follow-Up: At [facility], you and your health needs are our priority.  As part of our continuing mission to provide you with exceptional heart care, we have created designated Provider Care Teams.  These Care Teams include your primary Cardiologist (physician) and Advanced Practice Providers (APPs -  Physician Assistants and Nurse Practitioners) who all work together to provide you with the care you need, when you need it.  Your next appointment:   5 month(s)  The format for your next appointment:   In Person  Provider: Rudean Haskell, MD  Important Information About Sugar

## 2022-08-09 ENCOUNTER — Other Ambulatory Visit: Payer: Medicare Other

## 2022-08-09 DIAGNOSIS — I080 Rheumatic disorders of both mitral and aortic valves: Secondary | ICD-10-CM | POA: Diagnosis not present

## 2022-08-09 DIAGNOSIS — I340 Nonrheumatic mitral (valve) insufficiency: Secondary | ICD-10-CM | POA: Diagnosis not present

## 2022-08-09 DIAGNOSIS — I361 Nonrheumatic tricuspid (valve) insufficiency: Secondary | ICD-10-CM | POA: Diagnosis not present

## 2022-08-10 LAB — BASIC METABOLIC PANEL
BUN/Creatinine Ratio: 23 (ref 10–24)
BUN: 34 mg/dL — ABNORMAL HIGH (ref 8–27)
CO2: 26 mmol/L (ref 20–29)
Calcium: 9.2 mg/dL (ref 8.6–10.2)
Chloride: 99 mmol/L (ref 96–106)
Creatinine, Ser: 1.48 mg/dL — ABNORMAL HIGH (ref 0.76–1.27)
Glucose: 90 mg/dL (ref 70–99)
Potassium: 4 mmol/L (ref 3.5–5.2)
Sodium: 140 mmol/L (ref 134–144)
eGFR: 47 mL/min/{1.73_m2} — ABNORMAL LOW (ref >59)

## 2022-08-10 LAB — PRO B NATRIURETIC PEPTIDE: NT-Pro BNP: 2366 pg/mL — ABNORMAL HIGH (ref 0–486)

## 2022-09-05 ENCOUNTER — Ambulatory Visit: Payer: Medicare Other | Attending: Cardiovascular Disease

## 2022-09-05 DIAGNOSIS — Z7901 Long term (current) use of anticoagulants: Secondary | ICD-10-CM | POA: Diagnosis not present

## 2022-09-05 DIAGNOSIS — I4821 Permanent atrial fibrillation: Secondary | ICD-10-CM | POA: Insufficient documentation

## 2022-09-05 LAB — POCT INR: INR: 2.1 (ref 2.0–3.0)

## 2022-09-05 NOTE — Patient Instructions (Signed)
Description   Continue taking Warfarin 1 tablet daily. Recheck INR 7 weeks. Call us with any update or changes [phone].

## 2022-09-08 ENCOUNTER — Other Ambulatory Visit: Payer: Self-pay | Admitting: Cardiovascular Disease

## 2022-09-08 ENCOUNTER — Other Ambulatory Visit: Payer: Self-pay | Admitting: Internal Medicine

## 2022-09-21 DIAGNOSIS — Z23 Encounter for immunization: Secondary | ICD-10-CM | POA: Diagnosis not present

## 2022-09-24 DIAGNOSIS — Z8546 Personal history of malignant neoplasm of prostate: Secondary | ICD-10-CM | POA: Diagnosis not present

## 2022-09-27 DIAGNOSIS — C641 Malignant neoplasm of right kidney, except renal pelvis: Secondary | ICD-10-CM | POA: Diagnosis not present

## 2022-09-27 DIAGNOSIS — D49511 Neoplasm of unspecified behavior of right kidney: Secondary | ICD-10-CM | POA: Diagnosis not present

## 2022-09-27 DIAGNOSIS — K7689 Other specified diseases of liver: Secondary | ICD-10-CM | POA: Diagnosis not present

## 2022-09-27 DIAGNOSIS — I700 Atherosclerosis of aorta: Secondary | ICD-10-CM | POA: Diagnosis not present

## 2022-10-01 DIAGNOSIS — Z8546 Personal history of malignant neoplasm of prostate: Secondary | ICD-10-CM | POA: Diagnosis not present

## 2022-10-01 DIAGNOSIS — D3000 Benign neoplasm of unspecified kidney: Secondary | ICD-10-CM | POA: Diagnosis not present

## 2022-10-04 DIAGNOSIS — Z23 Encounter for immunization: Secondary | ICD-10-CM | POA: Diagnosis not present

## 2022-10-24 ENCOUNTER — Ambulatory Visit: Payer: Medicare Other | Attending: Cardiology | Admitting: *Deleted

## 2022-10-24 DIAGNOSIS — Z7901 Long term (current) use of anticoagulants: Secondary | ICD-10-CM

## 2022-10-24 DIAGNOSIS — I4821 Permanent atrial fibrillation: Secondary | ICD-10-CM | POA: Diagnosis not present

## 2022-10-24 LAB — POCT INR: INR: 2.5 (ref 2.0–3.0)

## 2022-10-24 NOTE — Patient Instructions (Signed)
Description   Continue taking Warfarin 1 tablet daily. Recheck INR 8 weeks. Call us with any update or changes #[phone].       

## 2022-11-13 ENCOUNTER — Ambulatory Visit (HOSPITAL_COMMUNITY): Payer: Medicare Other | Attending: Internal Medicine

## 2022-11-13 DIAGNOSIS — I080 Rheumatic disorders of both mitral and aortic valves: Secondary | ICD-10-CM | POA: Insufficient documentation

## 2022-11-13 LAB — ECHOCARDIOGRAM COMPLETE
MV M vel: 5.12 m/s
MV Peak grad: 104.9 mmHg
P 1/2 time: 515 msec
Radius: 0.45 cm
S' Lateral: 3.7 cm

## 2022-12-11 NOTE — Progress Notes (Signed)
Cardiology Office Note:    Date:  12/12/2022   ID:  Daniel Love, DOB 1940-02-04, MRN [id number]  PCP:  Eartha Inch, MD   [facility]  Cardiologist:   Riley Lam MD Advanced Practice Provider:  No care team member to display Electrophysiologist:  None   Referring MD: Eartha Inch, MD   CC:  Follow up post surgery  History of Present Illness:    Daniel Love is a 82 y.o. male with a hx of HTN Permanent atrial fibrillation with moderate atrial functional MR, Moderate aortic aneurysm (4.5 cm-> Ascending Aortic Heigh Index) seen by Dr. Laneta Simmers who presented to establish care.  Since last vist he had an echo with improved LVEF and stable Aortic size.   2023: pre-op for renal mass cryoablation. Still had moderate to severe MR  Wife is about to undergo back surgery.  Patient notes that he is doing well.   He does some stationary biking; and does chores.  Is relatively sedentary. There are no interval hospital/ED visit.    No chest pain or pressure .  No SOB/DOE and no PND/Orthopnea.  No weight gain or leg swelling.  No palpitations or syncope.    Past Medical History:  Diagnosis Date   Anal fissure    hx of    Arthritis    Atrial fibrillation (HCC)    maintaining normal sinus rhythm   Chronic anticoagulation    coumadin   Dyspepsia    Dysrhythmia    Afib   GERD (gastroesophageal reflux disease)    Glaucoma    History of colon polyps 2012   Colonoscopy-Dr. Elnoria Howard    History of echocardiogram    Echocardiogram 2/19: EF 60-65, normal wall motion, trivial AI, moderate to severe MR, mild LAE, mild RAE, mild to moderate TR, PASP 35   History of kidney stones    History of nuclear stress test    Myoview 2/19: EF 42, diffuse HK no significant ischemia, apical lateral scar   Hypertension    Prostate cancer (HCC)    Skin cancer    skin cancer removed from left arm and back    Past Surgical History:  Procedure Laterality  Date   BACK SURGERY  12/31/1977   for degenerative disc disease   COLONOSCOPY     IR RADIOLOGIST EVAL & MGMT  01/16/2022   IR RADIOLOGIST EVAL & MGMT  06/04/2022   PROSTATE BIOPSY     RADIOLOGY WITH ANESTHESIA N/A 02/21/2022   Procedure: CT WITH ANESTHESIA CRYOABLATION;  Surgeon: Irish Lack, MD;  Location: [facility];  Service: Radiology;  Laterality: N/A;   TEE WITHOUT CARDIOVERSION N/A 03/04/2019   Procedure: TRANSESOPHAGEAL ECHOCARDIOGRAM (TEE);  Surgeon: Vesta Mixer, MD;  Location: [facility] ENDOSCOPY;  Service: Cardiovascular;  Laterality: N/A;   toe removed     Right second toe   UPPER GASTROINTESTINAL ENDOSCOPY      Current Medications: Current Meds  Medication Sig   acetaminophen (TYLENOL) 500 MG tablet Take 1,000 mg by mouth every 6 (six) hours as needed (for pain.).   Boswellia-Glucosamine-Vit D (OSTEO BI-FLEX ONE PER DAY) TABS Take 1 tablet by mouth in the morning.   colchicine 0.6 MG tablet Take 0.6 mg by mouth daily as needed (gout flare).   diphenhydramine-acetaminophen (TYLENOL PM) 25-500 MG TABS tablet Take 1 tablet by mouth at bedtime as needed (sleep).   furosemide (LASIX) 40 MG tablet Take 1 tablet (40 mg total) by mouth daily.  ibuprofen (ADVIL) 200 MG tablet Take 200-400 mg by mouth every 8 (eight) hours as needed (pain.).   isosorbide mononitrate (IMDUR) 30 MG 24 hr tablet Take 15 mg by mouth in the morning.   LUMIGAN 0.01 % SOLN Place 1 drop into both eyes daily. Midday   Menthol, Topical Analgesic, (ICY HOT BACK EX) Apply 1 application topically daily as needed (pain).   Multiple Vitamin (MULTIVITAMIN WITH MINERALS) TABS tablet Take 1 tablet by mouth in the morning. Centrum Silver   olmesartan-hydrochlorothiazide (BENICAR HCT) 40-25 MG tablet TAKE ONE TABLET BY MOUTH EVERY NIGHT AT BEDTIME (Patient taking differently: Take 1 tablet by mouth daily after breakfast.)   oxymetazoline (AFRIN) 0.05 % nasal spray Place 1 spray into both nostrils at bedtime as needed for  congestion.   timolol (TIMOPTIC) 0.5 % ophthalmic solution 1 drop 2 (two) times daily.   warfarin (COUMADIN) 5 MG tablet TAKE 1 TO 1.5 TABLETS BY MOUTH DAILY AS INSTRUCTED BY COUMADIN CLINIC     Allergies:   Amlodipine and Lisinopril   Social History   Socioeconomic History   Marital status: Married    Spouse name: Not on file   Number of children: 3   Years of education: Not on file   Highest education level: Not on file  Occupational History   Occupation: Retired  Tobacco Use   Smoking status: Former    Packs/day: 1.00    Years: 7.00    Total pack years: 7.00    Types: Cigarettes    Quit date: 12/31/1966    Years since quitting: 55.9   Smokeless tobacco: Never  Vaping Use   Vaping Use: Never used  Substance and Sexual Activity   Alcohol use: Yes    Alcohol/week: 7.0 standard drinks of alcohol    Types: 7 Glasses of wine per week    Comment: 1 weekly   Drug use: No   Sexual activity: Not Currently  Other Topics Concern   Not on file  Social History Narrative   Patient is the primary care giver for his wife who is disabled. They share three daughters, two live local, one in [place]   09-26-18 Unable to ask abuse questions wife and daughter with him today.   Social Determinants of Health   Financial Resource Strain: Not on file  Food Insecurity: Not on file  Transportation Needs: Not on file  Physical Activity: Not on file  Stress: Not on file  Social Connections: Not on file    Social: married and his wife has dementia, now comes with daugther; wife is getting back surgery  Family History: The patient's family history includes Cancer in his father and mother; Colon cancer in his maternal grandmother; Heart attack in his father; Heart failure in his father. There is no history of Stroke, Esophageal cancer, Rectal cancer, or Stomach cancer.  ROS:   Please see the history of present illness.     All other systems reviewed and are negative.  EKGs/Labs/Other  Studies Reviewed:    The following studies were reviewed today:  EKG:   09/27/21: AF rate 57 SVR 09/19/20:  AF rate 54 with slow ventricular response  Cardiac Studies & Procedures     STRESS TESTS  MYOCARDIAL PERFUSION IMAGING 02/18/2018  Narrative  Nuclear stress EF: 42%.  There was no ST segment deviation noted during stress.  This is an intermediate risk study.  The left ventricular ejection fraction is moderately decreased (30-44%).  1. EF 42%, diffuse hypokinesis. 2. Fixed small,  mild apical lateral perfusion defect.  Possible prior infarction, no evidence for significant ischemia.  Intermediate risk study primarily due to low EF.   ECHOCARDIOGRAM  ECHOCARDIOGRAM COMPLETE 11/13/2022  Narrative ECHOCARDIOGRAM REPORT    Patient Name:   Daniel Love Date of Exam: 11/13/2022 Medical Rec #:  [id number]        Height:       71.0 in Accession #:    [id number]       Weight:       191.6 lb Date of Birth:  [birth year]       BSA:          2.070 m Patient Age:    81 years         BP:           116/70 mmHg Patient Gender: M                HR:           62 bpm. Exam Location:  [address]  Procedure: 2D Echo, Cardiac Doppler and Color Doppler  Indications:    I08.0 MR and AS  History:        Patient has prior history of Echocardiogram examinations, most recent 04/11/2022. Aortic Valve Disease, Arrythmias:Atrial Fibrillation and Bradycardia; Risk Factors:Hypertension and Former Smoker. Ascending aortic aneurysm.  Sonographer:    Jorje Guild [facility], RDCS Referring Phys: [id number] [facility] A Dione Mccombie  IMPRESSIONS   1. Left ventricular ejection fraction, by estimation, is 60 to 65%. The left ventricle has normal function. The left ventricle has no regional wall motion abnormalities. There is mild left ventricular hypertrophy. Left ventricular diastolic parameters are indeterminate. 2. Right ventricular systolic function is normal. The right ventricular size is  moderately enlarged. There is mildly elevated pulmonary artery systolic pressure. The estimated right ventricular systolic pressure is 38.0 mmHg. 3. Left atrial size was severely dilated. 4. Right atrial size was severely dilated. 5. Moderate to severe mitral valve regurgitation. 6. Tricuspid valve regurgitation is moderate. 7. The aortic valve is tricuspid. Aortic valve regurgitation is mild. 8. Aneurysm of the aortic root, measuring 42 mm. Aneurysm of the ascending aorta, measuring 47 mm. 9. The inferior vena cava is normal in size with <50% respiratory variability, suggesting right atrial pressure of 8 mmHg.  Comparison(s): No significant change from prior study. 04/11/22 EF 50-55%. Moderate-severe MR and TR. Ascending aorta 45mm. PA pressure .  FINDINGS Left Ventricle: Left ventricular ejection fraction, by estimation, is 60 to 65%. The left ventricle has normal function. The left ventricle has no regional wall motion abnormalities. The left ventricular internal cavity size was normal in size. There is mild left ventricular hypertrophy. Left ventricular diastolic parameters are indeterminate.  Right Ventricle: The right ventricular size is moderately enlarged. Right ventricular systolic function is normal. There is mildly elevated pulmonary artery systolic pressure. The tricuspid regurgitant velocity is 2.74 m/s, and with an assumed right atrial pressure of 8 mmHg, the estimated right ventricular systolic pressure is 38.0 mmHg.  Left Atrium: Left atrial size was severely dilated.  Right Atrium: Right atrial size was severely dilated.  Pericardium: There is no evidence of pericardial effusion.  Mitral Valve: Moderate to severe mitral valve regurgitation.  Tricuspid Valve: Tricuspid valve regurgitation is moderate.  Aortic Valve: The aortic valve is tricuspid. Aortic valve regurgitation is mild. Aortic regurgitation PHT measures 515 msec.  Pulmonic Valve: Pulmonic valve  regurgitation is not visualized.  Aorta: There is an aneurysm involving the aortic root measuring 42  mm. There is an aneurysm involving the ascending aorta measuring 47 mm.  Venous: The inferior vena cava is normal in size with less than 50% respiratory variability, suggesting right atrial pressure of 8 mmHg.  IAS/Shunts: No atrial level shunt detected by color flow Doppler.   LEFT VENTRICLE PLAX 2D LVIDd:         4.90 cm LVIDs:         3.70 cm LV PW:         1.10 cm LV IVS:        1.10 cm LVOT diam:     2.70 cm   3D Volume EF: LV SV:         87        3D EF:        47 % LV SV Index:   42        LV EDV:       137 ml LVOT Area:     5.73 cm  LV ESV:       72 ml LV SV:        65 ml  RIGHT VENTRICLE             IVC RV Basal diam:  4.50 cm     IVC diam: 2.00 cm RV S prime:     12.27 cm/s TAPSE (M-mode): 1.4 cm RVSP:           38.0 mmHg  LEFT ATRIUM              Index        RIGHT ATRIUM           Index LA diam:        5.60 cm  2.70 cm/m   RA Pressure: 8.00 mmHg LA Vol (A2C):   117.0 ml 56.51 ml/m  RA Area:     27.90 cm LA Vol (A4C):   104.0 ml 50.23 ml/m  RA Volume:   102.00 ml 49.26 ml/m LA Biplane Vol: 117.0 ml 56.51 ml/m AORTIC VALVE LVOT Vmax:   82.90 cm/s LVOT Vmean:  52.950 cm/s LVOT VTI:    0.152 m AI PHT:      515 msec  AORTA Ao Root diam: 4.20 cm Ao Asc diam:  4.70 cm  MR Peak grad:    104.9 mmHg   TRICUSPID VALVE MR Mean grad:    70.0 mmHg    TR Peak grad:   30.0 mmHg MR Vmax:         512.00 cm/s  TR Vmax:        274.00 cm/s MR Vmean:        401.0 cm/s   Estimated RAP:  8.00 mmHg MR PISA:         1.27 cm     RVSP:           38.0 mmHg MR PISA Eff ROA: 8 mm MR PISA Radius:  0.45 cm      SHUNTS Systemic VTI:  0.15 m Systemic Diam: 2.70 cm  Carolan Clines Electronically signed by Carolan Clines Signature Date/Time: 11/13/2022/2:50:48 PM    Final   TEE  ECHO TEE 03/04/2019  Narrative TRANSESOPHOGEAL ECHO REPORT    Patient Name:   Daniel Love  Date of Exam: 03/04/2019 Medical Rec #:  [id number]      Height:       71.0 in Accession #:    [id number]     Weight:       180.0 lb Date  of Birth:  1940-08-09     BSA:          2.02 m Patient Age:    78 years       BP:           159/99 mmHg Patient Gender: M              HR:           67 bpm. Exam Location:  Outpatient   Procedure: Transesophageal Echo, Color Doppler, Cardiac Doppler and 3d  Indications:     mitral regurgitation  History:         Patient has prior history of Echocardiogram examinations, most recent 01/02/2019.  Sonographer:     Delcie Roch Referring Phys:  [id number] Veverly Fells SKAINS Diagnosing Phys: Donato Schultz MD    PROCEDURE: Consent was requested emergently by emergency room physicain. The patient's vital signs; including heart rate, blood pressure, and oxygen saturation; remained stable throughout the procedure. The patient developed no complications during the procedure.  IMPRESSIONS   1. The left ventricle has mild-moderately reduced systolic function, with an ejection fraction of 40-45%. The cavity size was normal. Left ventricular diffuse hypokinesis. 2. The right ventricle has normal systolc function. The cavity was normal. There is no increase in right ventricular wall thickness. 3. Left atrial size was severely dilated. 4. 6.1cm left atrial diameter in 4 chamber view. 5. Right atrial size was severely dilated. 6. The mitral valve is myxomatous. Moderate thickening of the posterior mitral valve leaflet. Mitral valve regurgitation is moderate by color flow Doppler. The MR jet is centrally-directed. 7. MR PISA radius 0.5cm MR ERO 0.12 cm2 MR Volume 23ml.  8. The tricuspid valve was myxomatous. Tricuspid valve regurgitation is moderate-severe. 9. The aortic valve is tricuspid Mild thickening of the aortic valve Mild calcification of the aortic valve. Aortic valve regurgitation is mild by color flow Doppler. The jet is posteriorly-directed. 10. The pulmonic  valve was normal in structure. 11. There is dilatation of the ascending aorta measuring 41 mm. 12. Pulmonary hypertension is mild.  FINDINGS Left Ventricle: The left ventricle has mild-moderately reduced systolic function, with an ejection fraction of 40-45%. The cavity size was normal. There is no increase in left ventricular wall thickness. Left ventricular diffuse hypokinesis. Right Ventricle: The right ventricle has normal systolic function. The cavity was normal. There is no increase in right ventricular wall thickness. Left Atrium: Left atrial size was severely dilated. 6.1cm left atrial diameter in 4 chamber view. Right Atrium: Right atrial size was severely dilated. Right atrial pressure is estimated at 8 mmHg. Interatrial Septum: No atrial level shunt detected by color flow Doppler. Saline contrast bubble study was negative, with no evidence of any interatrial shunt. Pericardium: There is no evidence of pericardial effusion. Mitral Valve: The mitral valve is myxomatous. Moderate thickening of the posterior mitral valve leaflet. Mitral valve regurgitation is moderate by color flow Doppler. The MR jet is centrally-directed. Pulmonary venous flow is normal. MR PISA radius 0.5cm MR ERO 0.12 cm2 MR Volume 23ml.  Tricuspid Valve: The tricuspid valve was myxomatous. Tricuspid valve regurgitation is moderate-severe by color flow Doppler. Aortic Valve: The aortic valve is tricuspid Mild thickening of the aortic valve Mild calcification of the aortic valve. Aortic valve regurgitation is mild by color flow Doppler. The jet is posteriorly-directed. Pulmonic Valve: The pulmonic valve was normal in structure. Pulmonic valve regurgitation is not visualized by color flow Doppler. Aorta: There is dilatation of the ascending aorta measuring 41  mm. Pulmonary Artery: Pulmonary hypertension is mild. Venous: The inferior vena cava is normal in size with greater than 50% respiratory variability.  RIGHT  ATRIUM RA Pressure: 8 mmHg MR Peak grad: 117.5 mmHg MR Mean grad: 84.0 mmHg MR Vmax:      542.00 cm/s MR Vmean:     436.0 cm/s MR PISA:      1.57   Donato Schultz MD Electronically signed by Donato Schultz MD Signature Date/Time: 03/04/2019/3:49:35 PM    Final             CT Aorta: Date:02/22/2021 Results: IMPRESSION: 1. Stable prominence of the ascending thoracic aorta measuring 4.5 x 4.5 cm. Other thoracic aortic measurements as noted. No evident dissection. Ascending thoracic aortic aneurysm. Recommend semi-annual imaging followup by CTA or MRA and referral to cardiothoracic surgery if not already obtained. This recommendation follows 2010 ACCF/AHA/AATS/ACR/ASA/SCA/SCAI/SIR/STS/SVM Guidelines for the Diagnosis and Management of Patients With Thoracic Aortic Disease. Circulation. 2010; 121: L244-W102. Aortic aneurysm NOS (ICD10-I71.9). Scattered foci of coronary artery calcification noted.   2.  No evident pulmonary embolus.   3. Occasional calcified granulomas. Areas of atelectatic change, stable. No edema or airspace opacity.   4.  No evident adenopathy.   Recent Labs: 02/22/2022: Hemoglobin 12.3; Platelets 135 08/09/2022: BUN 34; Creatinine, Ser 1.48; NT-Pro BNP 2,366; Potassium 4.0; Sodium 140  Recent Lipid Panel No results found for: "CHOL", "TRIG", "HDL", "CHOLHDL", "VLDL", "LDLCALC", "LDLDIRECT"   Risk Assessment/Calculations:    CHA2DS2-VASc Score = 5  This indicates a 7.2% annual risk of stroke. The patient's score is based upon: CHF History: 1 HTN History: 1 Diabetes History: 0 Stroke History: 0 Vascular Disease History: 1 Age Score: 2 Gender Score: 0       Physical Exam:    VS:  BP 118/60   Pulse 67   Ht 5\' 11"  (1.803 m)   Wt 193 lb (87.5 kg)   SpO2 99%   BMI 26.92 kg/m     Wt Readings from Last 3 Encounters:  12/12/22 193 lb (87.5 kg)  07/26/22 191 lb 9.6 oz (86.9 kg)  02/21/22 188 lb 4.4 oz (85.4 kg)    Gen: No distress   Neck: No  JVD Cardiac: No Rubs or Gallops, no murmur despite echo with notable MR and TR , IRIR +2 radial pulses Respiratory: Clear to auscultation bilaterally, normal effort, normal  respiratory rate GI: Soft, nontender, non-distended  MS: +1 edema R left, +1 edema left leg;  Painful left thumb to touch Integument: Skin feels warm Neuro:  At time of evaluation, alert and oriented to person/place/time/situation  Psych: Normal affect, patient feels ok  ASSESSMENT:    1. Mitral valve insufficiency, unspecified etiology   2. Nonrheumatic tricuspid valve regurgitation   3. Permanent atrial fibrillation (HCC)   4. Long term (current) use of anticoagulants   5. Acquired thrombophilia (HCC)   6. Arthralgia, unspecified joint   7. HFrEF (heart failure with reduced ejection fraction) (HCC)     PLAN:    Moderate to Severe Mitral Regurgitation- Atrial Functional MR with Prolapse Moderate Tricuspid Regurgitation- atrial functional HFmrEF EF ~ 50% - NYHA class I, Stage B, euvolemic, etiology from AF though no prior ischemic testing - Diuretic regimen: lasix 40 mg PO daily (he takes an ARB with HCTZ as well) - compression stockings have been recommended - would not tolerate BB due to baseline bradycardia - would get uric acid today if normal will add SGLT2i  - we have discussed LHC/RHC and surgical/structural EVAL  with family.  After shared decision making would recommend this: In Late January he will see an APP; if still having leg swelling and some SOB will get LHC/RHC and TEE with me for evaluation of MR/TR and for evaluation of intervention with valve; if not will get echo in April  Permanent Atrial Fibrillation Rare PVC - Risk factors include Age X2 and HF, HTN - CHADSVASC=4.  - Continue anticoagulation with coumadin.  - asymptomatic  Asymptomatic thoracic aortic aneurysm - Last at 4.5 - Last scan 02/22/21-> planned for CT Aorta with Dr. Laneta Simmers 2024    Time Spent Directly with Patient:   I  have spent a total of 40 minutes with the patient reviewing notes, imaging, EKGs, labs and examining the patient as well as establishing an assessment and plan that was discussed personally with the patient.  > 50% of time was spent in direct patient care and family and reviewing imaging with patient.  APP in January   Medication Adjustments/Labs and Tests Ordered: Current medicines are reviewed at length with the patient today.  Concerns regarding medicines are outlined above.  Orders Placed This Encounter  Procedures   Uric acid   Pro b natriuretic peptide (BNP)   Basic metabolic panel   EKG 12-Lead    No orders of the defined types were placed in this encounter.    Patient Instructions  Medication Instructions:  Your physician recommends that you continue on your current medications as directed. Please refer to the Current Medication list given to you today.  *If you need a refill on your cardiac medications before your next appointment, please call your pharmacy*   Lab Work: Uric acid, BMET, BNP If you have labs (blood work) drawn today and your tests are completely normal, you will receive your results only by: MyChart Message (if you have MyChart) OR A paper copy in the mail If you have any lab test that is abnormal or we need to change your treatment, we will call you to review the results.  Follow-Up: At [facility], you and your health needs are our priority.  As part of our continuing mission to provide you with exceptional heart care, we have created designated Provider Care Teams.  These Care Teams include your primary Cardiologist (physician) and Advanced Practice Providers (APPs -  Physician Assistants and Nurse Practitioners) who all work together to provide you with the care you need, when you need it.    Your next appointment:   1 month(s)  The format for your next appointment:   In Person  Provider:   APP  Important Information About  Sugar     .inst   Signed, Christell Constant, MD  12/12/2022 11:18 AM    [facility]

## 2022-12-12 ENCOUNTER — Ambulatory Visit: Payer: Medicare Other | Attending: Internal Medicine | Admitting: Internal Medicine

## 2022-12-12 ENCOUNTER — Encounter: Payer: Self-pay | Admitting: Internal Medicine

## 2022-12-12 VITALS — BP 118/60 | HR 67 | Ht 71.0 in | Wt 193.0 lb

## 2022-12-12 DIAGNOSIS — I5020 Unspecified systolic (congestive) heart failure: Secondary | ICD-10-CM | POA: Diagnosis not present

## 2022-12-12 DIAGNOSIS — I4821 Permanent atrial fibrillation: Secondary | ICD-10-CM | POA: Insufficient documentation

## 2022-12-12 DIAGNOSIS — D6869 Other thrombophilia: Secondary | ICD-10-CM | POA: Insufficient documentation

## 2022-12-12 DIAGNOSIS — Z7901 Long term (current) use of anticoagulants: Secondary | ICD-10-CM | POA: Insufficient documentation

## 2022-12-12 DIAGNOSIS — I340 Nonrheumatic mitral (valve) insufficiency: Secondary | ICD-10-CM | POA: Insufficient documentation

## 2022-12-12 DIAGNOSIS — I361 Nonrheumatic tricuspid (valve) insufficiency: Secondary | ICD-10-CM | POA: Insufficient documentation

## 2022-12-12 DIAGNOSIS — M2550 Pain in unspecified joint: Secondary | ICD-10-CM | POA: Diagnosis not present

## 2022-12-12 NOTE — Patient Instructions (Signed)
Medication Instructions:  Your physician recommends that you continue on your current medications as directed. Please refer to the Current Medication list given to you today.  *If you need a refill on your cardiac medications before your next appointment, please call your pharmacy*   Lab Work: Uric acid, BMET, BNP If you have labs (blood work) drawn today and your tests are completely normal, you will receive your results only by: [place] (if you have MyChart) OR A paper copy in the mail If you have any lab test that is abnormal or we need to change your treatment, we will call you to review the results.  Follow-Up: At [facility], you and your health needs are our priority.  As part of our continuing mission to provide you with exceptional heart care, we have created designated Provider Care Teams.  These Care Teams include your primary Cardiologist (physician) and Advanced Practice Providers (APPs -  Physician Assistants and Nurse Practitioners) who all work together to provide you with the care you need, when you need it.    Your next appointment:   1 month(s)  The format for your next appointment:   In Person  Provider:   APP  Important Information About Sugar     .inst

## 2022-12-14 ENCOUNTER — Telehealth: Payer: Self-pay | Admitting: *Deleted

## 2022-12-14 LAB — BASIC METABOLIC PANEL
BUN/Creatinine Ratio: 28 — ABNORMAL HIGH (ref 10–24)
BUN: 43 mg/dL — ABNORMAL HIGH (ref 8–27)
CO2: 28 mmol/L (ref 20–29)
Calcium: 9.4 mg/dL (ref 8.6–10.2)
Chloride: 103 mmol/L (ref 96–106)
Creatinine, Ser: 1.52 mg/dL — ABNORMAL HIGH (ref 0.76–1.27)
Glucose: 91 mg/dL (ref 70–99)
Potassium: 4.1 mmol/L (ref 3.5–5.2)
Sodium: 144 mmol/L (ref 134–144)
eGFR: 46 mL/min/{1.73_m2} — ABNORMAL LOW (ref >59)

## 2022-12-14 LAB — PRO B NATRIURETIC PEPTIDE: NT-Pro BNP: 2697 pg/mL — ABNORMAL HIGH (ref 0–486)

## 2022-12-14 LAB — URIC ACID: Uric Acid: 10.4 mg/dL — ABNORMAL HIGH (ref 3.8–8.4)

## 2022-12-14 NOTE — Telephone Encounter (Signed)
-----   Message from Werner Lean, MD sent at 12/14/2022 10:54 AM EST ----- Results: BNP Elevated  Uric Acid Elevated Stable Kidney function  Plan: Add Fargixa 10; would f/u with PCP; joint pain may be gout Results to PCP  Werner Lean, MD

## 2022-12-14 NOTE — Telephone Encounter (Signed)
Left message to call office

## 2022-12-18 DIAGNOSIS — H401132 Primary open-angle glaucoma, bilateral, moderate stage: Secondary | ICD-10-CM | POA: Diagnosis not present

## 2022-12-19 ENCOUNTER — Ambulatory Visit: Payer: Medicare Other | Attending: Cardiovascular Disease | Admitting: *Deleted

## 2022-12-19 DIAGNOSIS — I4821 Permanent atrial fibrillation: Secondary | ICD-10-CM | POA: Diagnosis not present

## 2022-12-19 DIAGNOSIS — Z7901 Long term (current) use of anticoagulants: Secondary | ICD-10-CM

## 2022-12-19 LAB — POCT INR: INR: 2.8 (ref 2.0–3.0)

## 2022-12-19 NOTE — Patient Instructions (Signed)
Description   Continue taking Warfarin 1 tablet daily. Recheck INR 8 weeks. Call us with any update or changes #[phone].       

## 2022-12-20 ENCOUNTER — Telehealth: Payer: Self-pay | Admitting: Internal Medicine

## 2022-12-20 MED ORDER — DAPAGLIFLOZIN PROPANEDIOL 10 MG PO TABS: 10.0000 mg | ORAL_TABLET | Freq: Every day | ORAL | 3 refills | Status: DC

## 2022-12-20 NOTE — Telephone Encounter (Signed)
The patient has been notified of the result and verbalized understanding.  All questions (if any) were answered. Precious Gilding, RN 12/20/2022 5:06 PM

## 2022-12-20 NOTE — Telephone Encounter (Signed)
The patient has been notified of the result and verbalized understanding.  All questions (if any) were answered. Grete Bosko N Joesph Marcy, RN 12/20/2022 11:12 AM    Advised pt that 30 days of samples are at the front desk for pick up.  Also advised that 30 day free trial code will be sent into pharmacy.  Advised pt to ask pharmacy cost of medication so can determine if will need pt assistance.  Pt assistance information placed with samples.  Also advised pt of need for good perineal hygiene. Pt expresses understanding.

## 2022-12-20 NOTE — Telephone Encounter (Signed)
Pt is returning call. Requesting return call.  

## 2022-12-21 ENCOUNTER — Telehealth: Payer: Self-pay | Admitting: Internal Medicine

## 2022-12-21 NOTE — Telephone Encounter (Signed)
  Pt c/o medication issue:  1. Name of Medication: farxiga   2. How are you currently taking this medication (dosage and times per day)?   3. Are you having a reaction (difficulty breathing--STAT)?   4. What is your medication issue? Pt said, when he tried to pick up this medication the coupon for free refill has been use and the pharmacy needs a new one

## 2022-12-21 NOTE — Telephone Encounter (Signed)
Spoke with pt who reports pharmacy insstructed him his card # had already been used for someone else.  Called pharmacy - pharmacist reports each pt has to have their own individual card for a 30 day supply.  Advised pt, at this time we do not have any more cards for a 30 day free trial offer.  Gave pt the pt assistance phone number of [phone] to call to see if they can contact his pharmacy with card information.  Advised this office will need to obtain more cards from the [employer] rep.

## 2022-12-28 ENCOUNTER — Telehealth: Payer: Self-pay | Admitting: Internal Medicine

## 2022-12-28 NOTE — Telephone Encounter (Signed)
Called pt advised I did not call pt on 12/23/22.  However, I do need to give pt the 1 800 number for Farxiga support to get code for 30 day free trial.  Pt was given [phone] to follow up.  Pt thanked me for returning call.

## 2022-12-28 NOTE — Telephone Encounter (Signed)
Patient states he is returning a call received on 12/24.

## 2023-01-09 ENCOUNTER — Other Ambulatory Visit: Payer: Self-pay | Admitting: Surgery

## 2023-01-09 DIAGNOSIS — I712 Thoracic aortic aneurysm, without rupture, unspecified: Secondary | ICD-10-CM

## 2023-01-15 DIAGNOSIS — I5032 Chronic diastolic (congestive) heart failure: Secondary | ICD-10-CM | POA: Insufficient documentation

## 2023-01-15 NOTE — Progress Notes (Addendum)
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 ([facility]) CT 01/2021:  ascending 4.5 cm  Chronic kidney disease  R renal mass s/p cryoablation 01/2022 Prostate CA     History of Present Illness:   Daniel Love is a 83 y.o. male with the above problem list.  He was last seen by Dr. Gasper Sells 12/12/22. NT Pro BNP was > 2K. He was started on Farxiga. He is brought back for f/u on CHF, MR. The plan is to proceed with L and R cardiac catheterization and TEE if he continues to have leg edema and shortness of breath.   He is here today with his daughter.  Since last seen, he has not had significant shortness of breath.  He uses a pedal exerciser most days. He can go up and down his stairs several times a day. He usually goes out to refill his bird feeders every day. His daughter notes that he has a big yard with a lot of hills. He does not have significant shortness of breath with this. He still has some R ankle edema that mostly resolves with elevation. He has not had significant cough, orthopnea. He has not had syncope.            Reviewed and updated this encounter:   Tobacco  Allergies  Meds  Problems  Med Hx  Surg Hx  Fam Hx     ROS  Labs/Other Test Reviewed:   Recent Labs: 02/22/2022: Hemoglobin 12.3; Platelets 135 12/12/2022: BUN 43; Creatinine, Ser 1.52; NT-Pro BNP 2,697; Potassium 4.1; Sodium 144  Recent Lipid Panel No results for input(s): "CHOL", "TRIG", "HDL", "VLDL", "LDLCALC", "LDLDIRECT" in the last 8760 hours.  Risk Assessment/Calculations/Metrics:    CHA2DS2-VASc Score = 5   This indicates a 7.2% annual risk of stroke. The patient's score is based upon: CHF History: 1 HTN History: 1 Diabetes History: 0 Stroke History: 0 Vascular Disease History: 1 Age Score: 2 Gender Score: 0            Physical Exam:   VS:  BP 124/70   Pulse (!) 59   Ht '5\' 11"'$  (1.803 m)   Wt 192 lb (87.1 kg)   SpO2 97%   BMI 26.78 kg/m    Wt Readings from Last 3 Encounters:  01/16/23 192 lb (87.1 kg)  12/12/22 193 lb (87.5 kg)  07/26/22 191 lb 9.6 oz (86.9 kg)    Constitutional:      Appearance: Healthy appearance. Not in distress.  Neck:     Vascular: No JVR. JVD normal.  Pulmonary:     Breath sounds: Normal breath  sounds. No rales.  Cardiovascular:     Bradycardia present. Irregularly irregular rhythm.     Murmurs: There is no murmur.  Edema:    Ankle: trace edema of the left ankle and 1+ edema of the right ankle. Abdominal:     Palpations: Abdomen is soft. There is no hepatomegaly.          ASSESSMENT & PLAN:   Mitral valve insufficiency Mod to severe MR on TTE in Nov 2023. He is NYHA II. Volume status stable. He notes some subtle weakness in his legs. Otherwise, he does not seem to be having any symptoms related to his MR. I cannot hear his murmur on exam. His lungs are clear. He has varicose veins noted in his lower extremities. It is s/w worse on the left and he does not fairly significant improvement with elevation. I suspect he has a component of venous insufficiency contributing to his  edema. I reviewed Dr. Oralia Rud recommendations from his last OV note with the patient and his daughter. The plan was to proceed with cath and TEE if he was short of breath and still had edema or repeat his echocardiogram in April if not. As he is fairly active w/o significant shortness of breath and has mild edema without overt signs/symptoms of HF on exam, I think it is reasonable to hold off on invasive evaluation for now. I discussed this with the patient and his daughter. With shared decision making, the patient and his daughter agree to hold off on cardiac catheterization, TEE for now. Repeat Echocardiogram in April 2024 F/u with Dr. Gasper Sells in April 2024, after echocardiogram.  Chronic heart failure with preserved ejection fraction ([place]) EF was 40-45 in 2020. EF has improved to normal. TTE in 10/2022 with EF 60-65. As noted, he is NYHA II (+shortness of breath only with more extreme activities). Volume status is stable. As noted plan repeat TTE in April 2024. Continue Farxiga 10 mg once daily, Lasix 40 mg once daily, Benicar/HCT 40/25 mg once daily. Obtain f/u BMET, BNP. If BNP is higher than it was in Dec, will d/w Dr. Gasper Sells further. F/u with Dr. Gasper Sells in April 2024.  Permanent atrial fibrillation (HCC) Rate controlled. He is tolerating anticoagulation. Continue Coumadin. Continue f/u with Coumadin clinic for monitoring.   Thoracic aortic aneurysm (HCC) 45 mm on CT in 01/2021. Continue f/u with CT surgery ([name]) as planned.            Dispo:  Return in about 3 months (around 04/17/2023) for Follow up after testing with Dr. Gasper Sells.    Signed, Richardson Dopp, PA-C  01/16/2023 11:28 AM    [facility] [address], [place], [place]  [address] Phone: [phone]; Fax: [phone]

## 2023-01-16 ENCOUNTER — Encounter: Payer: Self-pay | Admitting: Physician Assistant

## 2023-01-16 ENCOUNTER — Ambulatory Visit: Payer: Medicare Other | Attending: Physician Assistant | Admitting: Physician Assistant

## 2023-01-16 VITALS — BP 124/70 | HR 59 | Ht 71.0 in | Wt 192.0 lb

## 2023-01-16 DIAGNOSIS — I712 Thoracic aortic aneurysm, without rupture, unspecified: Secondary | ICD-10-CM | POA: Insufficient documentation

## 2023-01-16 DIAGNOSIS — I340 Nonrheumatic mitral (valve) insufficiency: Secondary | ICD-10-CM | POA: Diagnosis not present

## 2023-01-16 DIAGNOSIS — I4821 Permanent atrial fibrillation: Secondary | ICD-10-CM | POA: Diagnosis not present

## 2023-01-16 DIAGNOSIS — I5032 Chronic diastolic (congestive) heart failure: Secondary | ICD-10-CM | POA: Insufficient documentation

## 2023-01-16 DIAGNOSIS — I7121 Aneurysm of the ascending aorta, without rupture: Secondary | ICD-10-CM

## 2023-01-16 NOTE — Assessment & Plan Note (Addendum)
Mod to severe MR on TTE in Nov 2023. He is NYHA II. Volume status stable. He notes some subtle weakness in his legs. Otherwise, he does not seem to be having any symptoms related to his MR. I cannot hear his murmur on exam. His lungs are clear. He has varicose veins noted in his lower extremities. It is s/w worse on the left and he does not fairly significant improvement with elevation. I suspect he has a component of venous insufficiency contributing to his edema. I reviewed Dr. Oralia Rud recommendations from his last OV note with the patient and his daughter. The plan was to proceed with cath and TEE if he was short of breath and still had edema or repeat his echocardiogram in April if not. As he is fairly active w/o significant shortness of breath and has mild edema without overt signs/symptoms of HF on exam, I think it is reasonable to hold off on invasive evaluation for now. I discussed this with the patient and his daughter. With shared decision making, the patient and his daughter agree to hold off on cardiac catheterization, TEE for now. Repeat Echocardiogram in April 2024 F/u with Dr. Gasper Sells in April 2024, after echocardiogram.

## 2023-01-16 NOTE — Assessment & Plan Note (Addendum)
EF was 40-45 in 2020. EF has improved to normal. TTE in 10/2022 with EF 60-65. As noted, he is NYHA II (+shortness of breath only with more extreme activities). Volume status is stable. As noted plan repeat TTE in April 2024. Continue Farxiga 10 mg once daily, Lasix 40 mg once daily, Benicar/HCT 40/25 mg once daily. Obtain f/u BMET, BNP. If BNP is higher than it was in Dec, will d/w Dr. Gasper Sells further. F/u with Dr. Gasper Sells in April 2024.

## 2023-01-16 NOTE — Assessment & Plan Note (Signed)
Rate controlled. He is tolerating anticoagulation. Continue Coumadin. Continue f/u with Coumadin clinic for monitoring.

## 2023-01-16 NOTE — Assessment & Plan Note (Signed)
45 mm on CT in 01/2021. Continue f/u with CT surgery ([name]) as planned.

## 2023-01-16 NOTE — Patient Instructions (Addendum)
Medication Instructions:  Your physician recommends that you continue on your current medications as directed. Please refer to the Current Medication list given to you today.  *If you need a refill on your cardiac medications before your next appointment, please call your pharmacy*   Lab Work: TODAY:  BMET & PRO BNP  If you have labs (blood work) drawn today and your tests are completely normal, you will receive your results only by: [place] (if you have MyChart) OR A paper copy in the mail If you have any lab test that is abnormal or we need to change your treatment, we will call you to review the results.   Testing/Procedures: Your physician has requested that you have an echocardiogram IN APRIL 2024. Echocardiography is a painless test that uses sound waves to create images of your heart. It provides your doctor with information about the size and shape of your heart and how well your heart's chambers and valves are working. This procedure takes approximately one hour. There are no restrictions for this procedure. Please do NOT wear cologne, perfume, aftershave, or lotions (deodorant is allowed). Please arrive 15 minutes prior to your appointment time.    Follow-Up: At [facility], you and your health needs are our priority.  As part of our continuing mission to provide you with exceptional heart care, we have created designated Provider Care Teams.  These Care Teams include your primary Cardiologist (physician) and Advanced Practice Providers (APPs -  Physician Assistants and Nurse Practitioners) who all work together to provide you with the care you need, when you need it.  We recommend signing up for the patient portal called "MyChart".  Sign up information is provided on this After Visit Summary.  MyChart is used to connect with patients for Virtual Visits (Telemedicine).  Patients are able to view lab/test results, encounter notes, upcoming appointments, etc.   Non-urgent messages can be sent to your provider as well.   To learn more about what you can do with MyChart, go to [email or web address].    Your next appointment:   3 month(s) (after echo)  Provider:    Rudean Haskell, MD    Other Instructions

## 2023-01-17 LAB — BASIC METABOLIC PANEL
BUN/Creatinine Ratio: 26 — ABNORMAL HIGH (ref 10–24)
BUN: 40 mg/dL — ABNORMAL HIGH (ref 8–27)
CO2: 25 mmol/L (ref 20–29)
Calcium: 9.8 mg/dL (ref 8.6–10.2)
Chloride: 101 mmol/L (ref 96–106)
Creatinine, Ser: 1.52 mg/dL — ABNORMAL HIGH (ref 0.76–1.27)
Glucose: 92 mg/dL (ref 70–99)
Potassium: 4.2 mmol/L (ref 3.5–5.2)
Sodium: 143 mmol/L (ref 134–144)
eGFR: 45 mL/min/{1.73_m2} — ABNORMAL LOW (ref >59)

## 2023-01-17 LAB — PRO B NATRIURETIC PEPTIDE: NT-Pro BNP: 2282 pg/mL — ABNORMAL HIGH (ref 0–486)

## 2023-01-20 ENCOUNTER — Other Ambulatory Visit: Payer: Self-pay | Admitting: Internal Medicine

## 2023-01-20 DIAGNOSIS — I4821 Permanent atrial fibrillation: Secondary | ICD-10-CM

## 2023-01-21 NOTE — Telephone Encounter (Signed)
Warfarin refill Last INR 12/19/22 Last OV 01/16/23

## 2023-01-23 DIAGNOSIS — Z85828 Personal history of other malignant neoplasm of skin: Secondary | ICD-10-CM | POA: Diagnosis not present

## 2023-01-23 DIAGNOSIS — L812 Freckles: Secondary | ICD-10-CM | POA: Diagnosis not present

## 2023-01-23 DIAGNOSIS — D1801 Hemangioma of skin and subcutaneous tissue: Secondary | ICD-10-CM | POA: Diagnosis not present

## 2023-01-23 DIAGNOSIS — L570 Actinic keratosis: Secondary | ICD-10-CM | POA: Diagnosis not present

## 2023-01-23 DIAGNOSIS — L821 Other seborrheic keratosis: Secondary | ICD-10-CM | POA: Diagnosis not present

## 2023-01-23 DIAGNOSIS — L853 Xerosis cutis: Secondary | ICD-10-CM | POA: Diagnosis not present

## 2023-01-23 DIAGNOSIS — L817 Pigmented purpuric dermatosis: Secondary | ICD-10-CM | POA: Diagnosis not present

## 2023-02-13 ENCOUNTER — Ambulatory Visit: Payer: Medicare Other | Attending: Cardiovascular Disease

## 2023-02-13 DIAGNOSIS — I4821 Permanent atrial fibrillation: Secondary | ICD-10-CM | POA: Diagnosis not present

## 2023-02-13 DIAGNOSIS — Z7901 Long term (current) use of anticoagulants: Secondary | ICD-10-CM | POA: Diagnosis not present

## 2023-02-13 LAB — POCT INR: INR: 2.6 (ref 2.0–3.0)

## 2023-02-13 NOTE — Patient Instructions (Signed)
Description   Continue taking Warfarin 1 tablet daily. Recheck INR 8 weeks. Call us with any update or changes [phone].

## 2023-02-27 ENCOUNTER — Ambulatory Visit
Admission: RE | Admit: 2023-02-27 | Discharge: 2023-02-27 | Disposition: A | Discharge status: Home / Self Care | Payer: Medicare Other | Source: Ambulatory Visit | Attending: Surgery | Admitting: Surgery

## 2023-02-27 ENCOUNTER — Ambulatory Visit (INDEPENDENT_AMBULATORY_CARE_PROVIDER_SITE_OTHER): Payer: Medicare Other | Admitting: Surgery

## 2023-02-27 ENCOUNTER — Encounter: Payer: Self-pay | Admitting: Surgery

## 2023-02-27 VITALS — BP 116/71 | HR 69 | Resp 18 | Ht 71.0 in | Wt 187.6 lb

## 2023-02-27 DIAGNOSIS — I712 Thoracic aortic aneurysm, without rupture, unspecified: Secondary | ICD-10-CM | POA: Diagnosis not present

## 2023-02-27 DIAGNOSIS — I2510 Atherosclerotic heart disease of native coronary artery without angina pectoris: Secondary | ICD-10-CM | POA: Diagnosis not present

## 2023-02-27 DIAGNOSIS — I359 Nonrheumatic aortic valve disorder, unspecified: Secondary | ICD-10-CM | POA: Diagnosis not present

## 2023-02-27 DIAGNOSIS — Z8546 Personal history of malignant neoplasm of prostate: Secondary | ICD-10-CM | POA: Diagnosis not present

## 2023-02-27 DIAGNOSIS — Z85528 Personal history of other malignant neoplasm of kidney: Secondary | ICD-10-CM | POA: Diagnosis not present

## 2023-02-27 MED ORDER — IOPAMIDOL (ISOVUE-370) INJECTION 76%
75.0000 mL | Freq: Once | INTRAVENOUS | Status: AC | PRN
  Administered 2023-02-27: 75 mL via INTRAVENOUS

## 2023-02-27 NOTE — Progress Notes (Signed)
HPI:  The patient is an 83 year old gentleman who returns for follow-up of a stable 4.5 cm fusiform ascending aortic aneurysm.  He continues to feel well without chest or back pain.  He has a history of congestive heart failure and moderate to severe mitral regurgitation by echocardiogram and November 2023.  He has improved with Lasix and [place].  He denies any shortness of breath at this time and has minimal right ankle edema that resolves with elevation.  He is here today with his daughter.  His wife has dementia.  Current Outpatient Medications  Medication Sig Dispense Refill   acetaminophen (TYLENOL) 500 MG tablet Take 1,000 mg by mouth every 6 (six) hours as needed (for pain.).     Boswellia-Glucosamine-Vit D (OSTEO BI-FLEX ONE PER DAY) TABS Take 1 tablet by mouth in the morning.     colchicine 0.6 MG tablet Take 0.6 mg by mouth daily as needed (gout flare).     dapagliflozin propanediol (FARXIGA) 10 MG TABS tablet Take 1 tablet (10 mg total) by mouth daily before breakfast. 90 tablet 3   diphenhydramine-acetaminophen (TYLENOL PM) 25-500 MG TABS tablet Take 1 tablet by mouth at bedtime as needed (sleep).     furosemide (LASIX) 40 MG tablet Take 1 tablet (40 mg total) by mouth daily. 90 tablet 3   ibuprofen (ADVIL) 200 MG tablet Take 200-400 mg by mouth every 8 (eight) hours as needed (pain.).     isosorbide mononitrate (IMDUR) 30 MG 24 hr tablet Take 15 mg by mouth in the morning.     LUMIGAN 0.01 % SOLN Place 1 drop into both eyes daily. Midday     Menthol, Topical Analgesic, (ICY HOT BACK EX) Apply 1 application topically daily as needed (pain).     Multiple Vitamin (MULTIVITAMIN WITH MINERALS) TABS tablet Take 1 tablet by mouth in the morning. Centrum Silver     olmesartan-hydrochlorothiazide (BENICAR HCT) 40-25 MG tablet TAKE ONE TABLET BY MOUTH EVERY NIGHT AT BEDTIME (Patient taking differently: Take 1 tablet by mouth daily after breakfast.) 90 tablet 3   oxymetazoline (AFRIN) 0.05  % nasal spray Place 1 spray into both nostrils at bedtime as needed for congestion.     timolol (TIMOPTIC) 0.5 % ophthalmic solution 1 drop 2 (two) times daily.     warfarin (COUMADIN) 5 MG tablet TAKE 1 TABLET BY MOUTH DAILY AS DIRECTED BY COUMADIN CLINIC 100 tablet 1   No current facility-administered medications for this visit.     Physical Exam: BP 116/71 (BP Location: Right Arm, Patient Position: Sitting)   Pulse 69   Resp 18   Ht '5\' 11"'$  (1.803 m)   Wt 187 lb 9.6 oz (85.1 kg)   SpO2 97% Comment: RA  BMI 26.16 kg/m  He looks well. Cardiac exam shows a regular rate and rhythm with normal heart sounds.  There is no murmur. Lungs are clear. There is no peripheral edema.  Diagnostic Tests:  Narrative & Impression  CLINICAL DATA:  Follow-up TAA, history of prostate cancer and renal cell carcinoma * Tracking Code: BO *   EXAM: CT ANGIOGRAPHY CHEST WITH CONTRAST   TECHNIQUE: Multidetector CT imaging of the chest was performed using the standard protocol during bolus administration of intravenous contrast. Multiplanar CT image reconstructions and MIPs were obtained to evaluate the vascular anatomy.   RADIATION DOSE REDUCTION: This exam was performed according to the departmental dose-optimization program which includes automated exposure control, adjustment of the mA and/or kV according to patient size  and/or use of iterative reconstruction technique.   CONTRAST:  90m ISOVUE-370 IOPAMIDOL (ISOVUE-370) INJECTION 76%   COMPARISON:  02/22/2021   FINDINGS: Cardiovascular: Preferential opacification of the thoracic aorta. Unchanged enlargement of the tubular ascending thoracic aorta measuring up to 4.4 x 4.3 cm. Aortic valve measures up to 2.8 cm in caliber. Sinuses of Valsalva measure up to 4.1 cm in caliber. Descending thoracic aorta is normal measuring up to 2.6 x 2.6 cm. Cardiomegaly. No pericardial effusion. Left and right coronary artery calcifications. Aortic  atherosclerosis.   Mediastinum/Nodes: No enlarged mediastinal, hilar, or axillary lymph nodes. Thyroid gland, trachea, and esophagus demonstrate no significant findings.   Lungs/Pleura: Scarring and or atelectasis of the lung bases. No pleural effusion or pneumothorax.   Upper Abdomen: No acute abnormality. Ablation site of the anterior superior pole of the right kidney.   Musculoskeletal: No chest wall abnormality. No acute osseous findings.   Review of the MIP images confirms the above findings.   IMPRESSION: 1. Unchanged enlargement of the tubular ascending thoracic aorta measuring up to 4.4 x 4.3 cm. Recommend annual imaging followup by CTA or MRA. This recommendation follows 2010 ACCF/AHA/AATS/ACR/ASA/SCA/SCAI/SIR/STS/SVM Guidelines for the Diagnosis and Management of Patients with Thoracic Aortic Disease. Circulation. 2010; 121:[id number] Aortic aneurysm NOS (ICD10-I71.9) 2. Cardiomegaly and coronary artery disease.   Aortic Atherosclerosis (ICD10-I70.0).     Electronically Signed   By: ADelanna AhmadiM.D.   On: 02/27/2023 11:26      Impression:  This 83year old gentleman has a stable 4.4 cm fusiform ascending aortic aneurysm.  This is well below the surgical threshold of 5.5 cm and is unlikely to ever require intervention.  I reviewed the CTA images with the patient and his daughter.  I stressed the importance of good blood pressure control in preventing further enlargement and acute aortic dissection.  Plan:  I will see him back in 2 years with a CT scan of the chest without contrast for aortic surveillance.  I spent 15 minutes performing this established patient evaluation and > 50% of this time was spent face to face counseling and coordinating the care of this patient's aortic aneurysm.    BGaye Pollack MD [facility] ([phone]

## 2023-03-14 DIAGNOSIS — Z23 Encounter for immunization: Secondary | ICD-10-CM | POA: Diagnosis not present

## 2023-03-18 ENCOUNTER — Encounter: Payer: Self-pay | Admitting: Gastroenterology

## 2023-03-26 DIAGNOSIS — H401132 Primary open-angle glaucoma, bilateral, moderate stage: Secondary | ICD-10-CM | POA: Diagnosis not present

## 2023-04-01 ENCOUNTER — Telehealth: Payer: Self-pay | Admitting: Gastroenterology

## 2023-04-01 DIAGNOSIS — K625 Hemorrhage of anus and rectum: Secondary | ICD-10-CM | POA: Diagnosis not present

## 2023-04-01 DIAGNOSIS — I4821 Permanent atrial fibrillation: Secondary | ICD-10-CM | POA: Diagnosis not present

## 2023-04-01 DIAGNOSIS — I712 Thoracic aortic aneurysm, without rupture, unspecified: Secondary | ICD-10-CM | POA: Diagnosis not present

## 2023-04-01 DIAGNOSIS — I5032 Chronic diastolic (congestive) heart failure: Secondary | ICD-10-CM | POA: Diagnosis not present

## 2023-04-01 DIAGNOSIS — I119 Hypertensive heart disease without heart failure: Secondary | ICD-10-CM | POA: Diagnosis not present

## 2023-04-01 DIAGNOSIS — C61 Malignant neoplasm of prostate: Secondary | ICD-10-CM | POA: Diagnosis not present

## 2023-04-01 DIAGNOSIS — D6869 Other thrombophilia: Secondary | ICD-10-CM | POA: Diagnosis not present

## 2023-04-01 DIAGNOSIS — Z1330 Encounter for screening examination for mental health and behavioral disorders, unspecified: Secondary | ICD-10-CM | POA: Diagnosis not present

## 2023-04-01 DIAGNOSIS — I10 Essential (primary) hypertension: Secondary | ICD-10-CM | POA: Diagnosis not present

## 2023-04-01 NOTE — Telephone Encounter (Signed)
Informed patient of appt, states he will be here

## 2023-04-01 NOTE — Progress Notes (Signed)
Cardiology Office Note:    Date:  04/05/2023   ID:  Daniel Love, DOB [birth year], MRN [id number]  PCP:  Eartha Inch, MD   [facility]  Cardiologist:   Riley Lam MD Advanced Practice Provider:  No care team member to display Electrophysiologist:  None   Referring MD: Eartha Inch, MD   CC:  Follow up post surgery  History of Present Illness:    Daniel Love is a 83 y.o. male with a hx of HTN Permanent atrial fibrillation with moderate atrial functional MR, Moderate aortic aneurysm seen by Dr. Laneta Simmers who presented to establish care.  Since last vist he had an echo with improved LVEF and stable Aortic size.   2023: pre-op for renal mass cryoablation. Still had moderate to severe MR 2024: Had Stable echo: mod to severe AI , Mod severe TR, and Mod severe MR    Patient notes that he is doing poorly.   Since last visit notes that he is having 1/3 t o 1/5 cup bright red blood when he defecates. . Blood counts have decreased and he feels more fatigued  No chest pain or pressure .  No SOB and no PND/Orthopnea.  No weight gain.  Daugther notes a bit of leg swelling last two days..  No palpitations or syncope (he is asymptomatic from AF).    Past Medical History:  Diagnosis Date   Anal fissure    hx of    Arthritis    Atrial fibrillation    maintaining normal sinus rhythm   Chronic anticoagulation    coumadin   Dyspepsia    Dysrhythmia    Afib   GERD (gastroesophageal reflux disease)    Glaucoma    History of colon polyps 2012   Colonoscopy-Dr. Elnoria Howard    History of echocardiogram    Echocardiogram 2/19: EF 60-65, normal wall motion, trivial AI, moderate to severe MR, mild LAE, mild RAE, mild to moderate TR, PASP 35   History of kidney stones    History of nuclear stress test    Myoview 2/19: EF 42, diffuse HK no significant ischemia, apical lateral scar   Hypertension    Prostate cancer    Skin cancer    skin cancer removed  from left arm and back    Past Surgical History:  Procedure Laterality Date   BACK SURGERY  12/31/1977   for degenerative disc disease   COLONOSCOPY     IR RADIOLOGIST EVAL & MGMT  01/16/2022   IR RADIOLOGIST EVAL & MGMT  06/04/2022   PROSTATE BIOPSY     RADIOLOGY WITH ANESTHESIA N/A 02/21/2022   Procedure: CT WITH ANESTHESIA CRYOABLATION;  Surgeon: Irish Lack, MD;  Location: [facility];  Service: Radiology;  Laterality: N/A;   TEE WITHOUT CARDIOVERSION N/A 03/04/2019   Procedure: TRANSESOPHAGEAL ECHOCARDIOGRAM (TEE);  Surgeon: Vesta Mixer, MD;  Location: [facility];  Service: Cardiovascular;  Laterality: N/A;   toe removed     Right second toe   UPPER GASTROINTESTINAL ENDOSCOPY      Current Medications: Current Meds  Medication Sig   acetaminophen (TYLENOL) 500 MG tablet Take 1,000 mg by mouth every 6 (six) hours as needed (for pain.).   Boswellia-Glucosamine-Vit D (OSTEO BI-FLEX ONE PER DAY) TABS Take 1 tablet by mouth in the morning.   clotrimazole-betamethasone (LOTRISONE) cream Apply topically as needed (rash).   colchicine 0.6 MG tablet Take 0.6 mg by mouth daily as needed (gout flare).   dapagliflozin  propanediol (FARXIGA) 10 MG TABS tablet Take 1 tablet (10 mg total) by mouth daily before breakfast.   diphenhydramine-acetaminophen (TYLENOL PM) 25-500 MG TABS tablet Take 1 tablet by mouth at bedtime as needed (sleep).   fluorouracil (EFUDEX) 5 % cream Apply 1 Application topically as needed (rash).   furosemide (LASIX) 40 MG tablet Take 1 tablet (40 mg total) by mouth daily.   ibuprofen (ADVIL) 200 MG tablet Take 200-400 mg by mouth every 8 (eight) hours as needed (pain.).   isosorbide mononitrate (IMDUR) 30 MG 24 hr tablet Take 15 mg by mouth in the morning.   LUMIGAN 0.01 % SOLN Place 1 drop into both eyes daily. Midday   Menthol, Topical Analgesic, (ICY HOT BACK EX) Apply 1 application topically daily as needed (pain).   Multiple Vitamin (MULTIVITAMIN WITH MINERALS)  TABS tablet Take 1 tablet by mouth in the morning. Centrum Silver   Na Sulfate-K Sulfate-Mg Sulf 17.5-3.13-1.6 GM/177ML SOLN Take by mouth as directed. colonoscopy   olmesartan-hydrochlorothiazide (BENICAR HCT) 40-25 MG tablet TAKE ONE TABLET BY MOUTH EVERY NIGHT AT BEDTIME   oxymetazoline (AFRIN) 0.05 % nasal spray Place 1 spray into both nostrils at bedtime as needed for congestion.   timolol (TIMOPTIC) 0.5 % ophthalmic solution 1 drop 2 (two) times daily.   [DISCONTINUED] warfarin (COUMADIN) 5 MG tablet TAKE 1 TABLET BY MOUTH DAILY AS DIRECTED BY COUMADIN CLINIC     Allergies:   Amlodipine and Lisinopril   Social History   Socioeconomic History   Marital status: Married    Spouse name: Not on file   Number of children: 3   Years of education: Not on file   Highest education level: Not on file  Occupational History   Occupation: Retired  Tobacco Use   Smoking status: Former    Packs/day: 1.00    Years: 7.00    Additional pack years: 0.00    Total pack years: 7.00    Types: Cigarettes    Quit date: 12/31/1966    Years since quitting: 56.2   Smokeless tobacco: Never  Vaping Use   Vaping Use: Never used  Substance and Sexual Activity   Alcohol use: Yes    Alcohol/week: 7.0 standard drinks of alcohol    Types: 7 Glasses of wine per week    Comment: 1 weekly   Drug use: No   Sexual activity: Not Currently  Other Topics Concern   Not on file  Social History Narrative   Patient is the primary care giver for his wife who is disabled. They share three daughters, two live local, one in [place]   09-26-18 Unable to ask abuse questions wife and daughter with him today.   Social Determinants of Health   Financial Resource Strain: Not on file  Food Insecurity: Not on file  Transportation Needs: Not on file  Physical Activity: Not on file  Stress: Not on file  Social Connections: Not on file    Social: married and his wife has dementia, now comes with daugther; wife is  getting back surgery  Family History: The patient's family history includes Cancer in his father and mother; Colon cancer in his maternal grandmother; Heart attack in his father; Heart failure in his father. There is no history of Stroke, Esophageal cancer, Rectal cancer, Stomach cancer, or Liver disease.  ROS:   Please see the history of present illness.     All other systems reviewed and are negative.  EKGs/Labs/Other Studies Reviewed:    The following studies  were reviewed today:  EKG:   03/05/2023: AF with PVCs 09/27/21: AF rate 57 SVR 09/19/20:  AF rate 54 with slow ventricular response  Cardiac Studies & Procedures     STRESS TESTS  MYOCARDIAL PERFUSION IMAGING 02/18/2018  Narrative  Nuclear stress EF: 42%.  There was no ST segment deviation noted during stress.  This is an intermediate risk study.  The left ventricular ejection fraction is moderately decreased (30-44%).  1. EF 42%, diffuse hypokinesis. 2. Fixed small, mild apical lateral perfusion defect.  Possible prior infarction, no evidence for significant ischemia.  Intermediate risk study primarily due to low EF.   ECHOCARDIOGRAM  ECHOCARDIOGRAM COMPLETE 04/03/2023  Narrative ECHOCARDIOGRAM REPORT    Patient Name:   DAHIR AYER Date of Exam: 04/03/2023 Medical Rec #:  [id number]        Height:       71.0 in Accession #:    [id number]       Weight:       187.6 lb Date of Birth:  1940/09/26       BSA:          2.052 m Patient Age:    82 years         BP:           110/70 mmHg Patient Gender: M                HR:           81 bpm. Exam Location:  [address]  Procedure: 2D Echo, 3D Echo, Cardiac Doppler, Color Doppler and Strain Analysis  Indications:     I50.9 CHF  History:         Patient has prior history of Echocardiogram examinations, most recent 11/13/2022. CHF, Abnormal ECG, Arrythmias:Atrial Fibrillation; Risk Factors:Family History of Coronary Artery Disease, Hypertension and Former  Smoker. Aortic Root and Ascending Aneurysm.  Sonographer:     Farrel Conners RDCS Referring Phys:  Evern Bio WEAVER Diagnosing Phys: Weston Brass MD  IMPRESSIONS   1. Left ventricular ejection fraction, by estimation, is 55 to 60%. Left ventricular ejection fraction by 3D volume is 61 %. The left ventricle has normal function. The left ventricle has no regional wall motion abnormalities. There is mild left ventricular hypertrophy. Left ventricular diastolic parameters are indeterminate. 2. Right ventricular systolic function is mildly reduced. The right ventricular size is moderately enlarged. There is mildly elevated pulmonary artery systolic pressure. The estimated right ventricular systolic pressure is 44.2 mmHg. 3. Left atrial size was severely dilated. 4. Right atrial size was severely dilated. 5. Posterior mitral leaflet prolapse with probable small flail segment. Moderate-severe MR with splay artifact in short axis suggesting possible underestimation. MR appeared severe on study from 11/13/22. The mitral valve is abnormal. Moderate to severe mitral valve regurgitation. No evidence of mitral stenosis. 6. Tricuspid valve regurgitation is severe. 7. The aortic valve is tricuspid. There is mild thickening of the aortic valve. Aortic valve regurgitation is moderate with a possible eccentric component from apical long axis view. Descending aorta diastolic reversal TVI 9 cm. No aortic stenosis is present. 8. Aortic dilatation noted. Aneurysm of the ascending aorta, measuring 45 mm. There is borderline dilatation of the aortic root, measuring 42 mm. There is moderate dilatation of the ascending aorta. 9. The inferior vena cava is dilated in size with <50% respiratory variability, suggesting right atrial pressure of 15 mmHg.  Comparison(s): Consider TEE to further evaluate mitral valve if clinically indicated.  FINDINGS Left Ventricle:  Left ventricular ejection fraction, by estimation, is  55 to 60%. Left ventricular ejection fraction by 3D volume is 61 %. The left ventricle has normal function. The left ventricle has no regional wall motion abnormalities. Global longitudinal strain performed but not reported based on interpreter judgement due to suboptimal tracking. The left ventricular internal cavity size was normal in size. There is mild left ventricular hypertrophy. Left ventricular diastolic function could not be evaluated due to mitral regurgitation (moderate or greater). Left ventricular diastolic parameters are indeterminate.  Right Ventricle: The right ventricular size is moderately enlarged. No increase in right ventricular wall thickness. Right ventricular systolic function is mildly reduced. There is mildly elevated pulmonary artery systolic pressure. The tricuspid regurgitant velocity is 2.70 m/s, and with an assumed right atrial pressure of 15 mmHg, the estimated right ventricular systolic pressure is 44.2 mmHg.  Left Atrium: Left atrial size was severely dilated.  Right Atrium: Right atrial size was severely dilated.  Pericardium: There is no evidence of pericardial effusion.  Mitral Valve: Posterior mitral leaflet prolapse with probable small flail segment. Moderate-severe MR with splay artifact in short axis suggesting possible underestimation. MR appeared severe on study from 11/13/22. The mitral valve is abnormal. Moderate to severe mitral valve regurgitation. No evidence of mitral valve stenosis.  Tricuspid Valve: The tricuspid valve is normal in structure. Tricuspid valve regurgitation is severe. No evidence of tricuspid stenosis.  Aortic Valve: The aortic valve is tricuspid. There is mild thickening of the aortic valve. Aortic valve regurgitation is moderate. Aortic regurgitation PHT measures 545 msec. No aortic stenosis is present.  Pulmonic Valve: The pulmonic valve was normal in structure. Pulmonic valve regurgitation is trivial. No evidence of pulmonic  stenosis.  Aorta: Aortic dilatation noted. There is borderline dilatation of the aortic root, measuring 42 mm. There is moderate dilatation of the ascending aorta. There is an aneurysm involving the ascending aorta measuring 45 mm.  Venous: The inferior vena cava is dilated in size with less than 50% respiratory variability, suggesting right atrial pressure of 15 mmHg.  IAS/Shunts: No atrial level shunt detected by color flow Doppler.   LEFT VENTRICLE PLAX 2D LVIDd:         4.50 cm         Diastology LVIDs:         3.00 cm         LV e' medial:    6.09 cm/s LV PW:         1.10 cm         LV E/e' medial:  11.9 LV IVS:        1.10 cm         LV e' lateral:   13.10 cm/s LVOT diam:     2.40 cm         LV E/e' lateral: 5.5 LV SV:         55 LV SV Index:   27              2D LVOT Area:     4.52 cm        Longitudinal Strain 2D Strain GLS  -15.4 % (A2C): 2D Strain GLS  -14.8 % (A3C): 2D Strain GLS  -19.9 % (A4C): 2D Strain GLS  -16.7 % Avg:  3D Volume EF LV 3D EF:    Left ventricul ar ejection fraction by 3D volume is 61 %.  3D Volume EF: 3D EF:        61 % LV EDV:  133 ml LV ESV:       52 ml LV SV:        81 ml  RIGHT VENTRICLE RV Basal diam:  4.70 cm RV Mid diam:    3.90 cm RV S prime:     10.60 cm/s TAPSE (M-mode): 1.5 cm RVSP:           32.2 mmHg  LEFT ATRIUM              Index        RIGHT ATRIUM           Index LA diam:        5.30 cm  2.58 cm/m   RA Pressure: 3.00 mmHg LA Vol (A2C):   127.0 ml 61.89 ml/m  RA Area:     28.30 cm LA Vol (A4C):   118.0 ml 57.51 ml/m  RA Volume:   98.20 ml  47.86 ml/m LA Biplane Vol: 125.0 ml 60.92 ml/m AORTIC VALVE LVOT Vmax:   72.75 cm/s LVOT Vmean:  44.550 cm/s LVOT VTI:    0.121 m AI PHT:      545 msec  AORTA Ao Root diam: 4.20 cm Ao Asc diam:  4.50 cm  MITRAL VALVE               TRICUSPID VALVE MV Area (PHT): cm         TR Peak grad:   29.2 mmHg MV Decel Time: 317 msec    TR Vmax:        270.00  cm/s MR Peak grad: 92.7 mmHg    Estimated RAP:  3.00 mmHg MR Mean grad: 60.5 mmHg    RVSP:           32.2 mmHg MR Vmax:      481.50 cm/s MR Vmean:     367.5 cm/s   SHUNTS MV E velocity: 72.30 cm/s  Systemic VTI:  0.12 m MV A velocity: 33.30 cm/s  Systemic Diam: 2.40 cm MV E/A ratio:  2.17  Weston Brass MD Electronically signed by Weston Brass MD Signature Date/Time: 04/03/2023/4:59:47 PM    Final (Updated)   TEE  ECHO TEE 03/04/2019  Narrative TRANSESOPHOGEAL ECHO REPORT    Patient Name:   NATHAN PINSON Date of Exam: 03/04/2019 Medical Rec #:  [id number]      Height:       71.0 in Accession #:    [id number]     Weight:       180.0 lb Date of Birth:  1940-02-10     BSA:          2.02 m Patient Age:    78 years       BP:           159/99 mmHg Patient Gender: M              HR:           67 bpm. Exam Location:  Outpatient   Procedure: Transesophageal Echo, Color Doppler, Cardiac Doppler and 3d  Indications:     mitral regurgitation  History:         Patient has prior history of Echocardiogram examinations, most recent 01/02/2019.  Sonographer:     Delcie Roch Referring Phys:  [id number] Veverly Fells SKAINS Diagnosing Phys: Donato Schultz MD    PROCEDURE: Consent was requested emergently by emergency room physicain. The patient's vital signs; including heart rate, blood pressure, and oxygen saturation; remained stable throughout the procedure. The patient developed  no complications during the procedure.  IMPRESSIONS   1. The left ventricle has mild-moderately reduced systolic function, with an ejection fraction of 40-45%. The cavity size was normal. Left ventricular diffuse hypokinesis. 2. The right ventricle has normal systolc function. The cavity was normal. There is no increase in right ventricular wall thickness. 3. Left atrial size was severely dilated. 4. 6.1cm left atrial diameter in 4 chamber view. 5. Right atrial size was severely dilated. 6. The mitral valve is  myxomatous. Moderate thickening of the posterior mitral valve leaflet. Mitral valve regurgitation is moderate by color flow Doppler. The MR jet is centrally-directed. 7. MR PISA radius 0.5cm MR ERO 0.12 cm2 MR Volume 31ml.  8. The tricuspid valve was myxomatous. Tricuspid valve regurgitation is moderate-severe. 9. The aortic valve is tricuspid Mild thickening of the aortic valve Mild calcification of the aortic valve. Aortic valve regurgitation is mild by color flow Doppler. The jet is posteriorly-directed. 10. The pulmonic valve was normal in structure. 11. There is dilatation of the ascending aorta measuring 41 mm. 12. Pulmonary hypertension is mild.  FINDINGS Left Ventricle: The left ventricle has mild-moderately reduced systolic function, with an ejection fraction of 40-45%. The cavity size was normal. There is no increase in left ventricular wall thickness. Left ventricular diffuse hypokinesis. Right Ventricle: The right ventricle has normal systolic function. The cavity was normal. There is no increase in right ventricular wall thickness. Left Atrium: Left atrial size was severely dilated. 6.1cm left atrial diameter in 4 chamber view. Right Atrium: Right atrial size was severely dilated. Right atrial pressure is estimated at 8 mmHg. Interatrial Septum: No atrial level shunt detected by color flow Doppler. Saline contrast bubble study was negative, with no evidence of any interatrial shunt. Pericardium: There is no evidence of pericardial effusion. Mitral Valve: The mitral valve is myxomatous. Moderate thickening of the posterior mitral valve leaflet. Mitral valve regurgitation is moderate by color flow Doppler. The MR jet is centrally-directed. Pulmonary venous flow is normal. MR PISA radius 0.5cm MR ERO 0.12 cm2 MR Volume 86ml.  Tricuspid Valve: The tricuspid valve was myxomatous. Tricuspid valve regurgitation is moderate-severe by color flow Doppler. Aortic Valve: The aortic valve is  tricuspid Mild thickening of the aortic valve Mild calcification of the aortic valve. Aortic valve regurgitation is mild by color flow Doppler. The jet is posteriorly-directed. Pulmonic Valve: The pulmonic valve was normal in structure. Pulmonic valve regurgitation is not visualized by color flow Doppler. Aorta: There is dilatation of the ascending aorta measuring 41 mm. Pulmonary Artery: Pulmonary hypertension is mild. Venous: The inferior vena cava is normal in size with greater than 50% respiratory variability.  RIGHT ATRIUM RA Pressure: 8 mmHg MR Peak grad: 117.5 mmHg MR Mean grad: 84.0 mmHg MR Vmax:      542.00 cm/s MR Vmean:     436.0 cm/s MR PISA:      1.57   Donato Schultz MD Electronically signed by Donato Schultz MD Signature Date/Time: 03/04/2019/3:49:35 PM    Final             CT Aorta: Date:02/22/2021 Results: IMPRESSION: 1. Stable prominence of the ascending thoracic aorta measuring 4.5 x 4.5 cm. Other thoracic aortic measurements as noted. No evident dissection. Ascending thoracic aortic aneurysm. Recommend semi-annual imaging followup by CTA or MRA and referral to cardiothoracic surgery if not already obtained. This recommendation follows 2010 ACCF/AHA/AATS/ACR/ASA/SCA/SCAI/SIR/STS/SVM Guidelines for the Diagnosis and Management of Patients With Thoracic Aortic Disease. Circulation. 2010; 121: B166-M600. Aortic aneurysm NOS (ICD10-I71.9). Scattered  foci of coronary artery calcification noted.   2.  No evident pulmonary embolus.   3. Occasional calcified granulomas. Areas of atelectatic change, stable. No edema or airspace opacity.   4.  No evident adenopathy.   Recent Labs: 01/16/2023: BUN 40; Creatinine, Ser 1.52; NT-Pro BNP 2,282; Potassium 4.2; Sodium 143 04/04/2023: Hemoglobin 11.4; Platelets 209.0  Recent Lipid Panel No results found for: "CHOL", "TRIG", "HDL", "CHOLHDL", "VLDL", "LDLCALC", "LDLDIRECT"    Physical Exam:    VS:  BP 112/60   Pulse 68    Ht  (1.803 m)   Wt 192 lb (87.1 kg)   SpO2 98%   BMI 26.78 kg/m     Wt Readings from Last 3 Encounters:  04/05/23 192 lb (87.1 kg)  04/04/23 190 lb (86.2 kg)  02/27/23 187 lb 9.6 oz (85.1 kg)    Gen: No distress   Neck: No JVD Cardiac: No Rubs or Gallops, Holosystolic murmur without diastolic murmur, IRIR +2 radial pulses Respiratory: Clear to auscultation bilaterally, normal effort, normal  respiratory rate GI: Soft, nontender, non-distended  MS: Trace bilateral ankle edema Integument: Skin feels warm Neuro:  At time of evaluation, alert and oriented to person/place/time/situation  Psych: Normal affect, patient feels ok  ASSESSMENT:    1. Permanent atrial fibrillation   2. Nonrheumatic mitral valve regurgitation   3. Nonrheumatic tricuspid valve regurgitation   4. Acquired thrombophilia     PLAN:     Preoperative risk eval - due to his active bleeding diathesis clearance algorithm does not apply: he is actively bleeding ~ 1/3 cup a blood per bowel movement - will stop coumadin until cleared to restart per GI, then resume at coumadin clinic - Agree with proceeding to GI evaluation without additional changes  Permanent Atrial Fibrillation Rare PVC - Risk factors include Age X2 and HF, HTN - CHADSVASC=4.  - asymptomatic - AC resumed when able as above  Moderate to Severe Mitral Regurgitation- Atrial Functional MR with prolapse and potential small flail segment Moderate to severe Tricuspid Regurgitation- atrial functional HFmrEF EF 50-55% - NYHA class I, Stage B, near euvolemic, etiology from AF though no prior ischemic testing - Diuretic regimen: lasix 40 mg PO daily; if he needs transfusion may need increased dose - continue SGLT2i - he takes comes ARB-HCTZ - compression stockings have been recommended - would not tolerate BB due to baseline bradycardia  When is bleeding has resolved we can once again discuss discuss evaluation for surgery or procedural  intervention. He has Severe MR with LVEF < 60%; the past few times I have met him he has met surgical indications but was not ready to proceed with this evaluation (his wife has notable dementia and he and his daugther provide a significant amount of care for her) When his bleeding has resolved, I would be amenable for LHC/RHC and TEE; I think there is a reasonable chance that TEER without multi-valve surgery/LAA excision may be the best intervention for him    Asymptomatic thoracic aortic aneurysm - Last at 45 mm - follows with Dr. Laneta Simmers   Time Spent Directly with Patient:   I have spent a total of 40 minutes with the patient reviewing notes, imaging, EKGs, labs and examining the patient as well as establishing an assessment and plan that was discussed personally with the patient.  > 50% of time was spent in direct patient care and family and reviewing imaging with patient.    Medication Adjustments/Labs and Tests Ordered: Current medicines are reviewed  at length with the patient today.  Concerns regarding medicines are outlined above.  Orders Placed This Encounter  Procedures   EKG 12-Lead    No orders of the defined types were placed in this encounter.    Patient Instructions  Medication Instructions:  Your physician has recommended you make the following change in your medication:  STOP: warfarin (Coumadin)   *If you need a refill on your cardiac medications before your next appointment, please call your pharmacy*   Lab Work: NONE If you have labs (blood work) drawn today and your tests are completely normal, you will receive your results only by: MyChart Message (if you have MyChart) OR A paper copy in the mail If you have any lab test that is abnormal or we need to change your treatment, we will call you to review the results.   Testing/Procedures: NONE   Follow-Up: At [facility] you and your health needs are our priority.  As part of our continuing  mission to provide you with exceptional heart care, we have created designated Provider Care Teams.  These Care Teams include your primary Cardiologist (physician) and Advanced Practice Providers (APPs -  Physician Assistants and Nurse Practitioners) who all work together to provide you with the care you need, when you need it.   Your next appointment:   5-6 Months  Provider:   Riley Lam, MD or Tereso Newcomer, [place]       Signed, Christell Constant, MD  04/05/2023 12:38 PM    [facility]

## 2023-04-01 NOTE — Telephone Encounter (Signed)
Kristen from [facility] calling to get sooner appt for patient states he is having rectal bleeding and takes Coumadin. Please advise

## 2023-04-01 NOTE — Telephone Encounter (Signed)
Pt scheduled to see Ellouise Newer PA 04/04/23 at 9am. Please let pt or Erasmo Downer know about appt.

## 2023-04-03 ENCOUNTER — Ambulatory Visit (HOSPITAL_COMMUNITY): Payer: Medicare Other | Attending: Physician Assistant

## 2023-04-03 DIAGNOSIS — I340 Nonrheumatic mitral (valve) insufficiency: Secondary | ICD-10-CM | POA: Diagnosis not present

## 2023-04-03 DIAGNOSIS — I4821 Permanent atrial fibrillation: Secondary | ICD-10-CM | POA: Diagnosis not present

## 2023-04-03 DIAGNOSIS — I5032 Chronic diastolic (congestive) heart failure: Secondary | ICD-10-CM | POA: Insufficient documentation

## 2023-04-03 LAB — ECHOCARDIOGRAM COMPLETE
Area-P 1/2: 2.4 cm2
MV M vel: 4.82 m/s
MV Peak grad: 92.7 mmHg
P 1/2 time: 545 msec
S' Lateral: 3 cm

## 2023-04-04 ENCOUNTER — Telehealth: Payer: Self-pay | Admitting: *Deleted

## 2023-04-04 ENCOUNTER — Encounter: Payer: Self-pay | Admitting: Physician Assistant

## 2023-04-04 ENCOUNTER — Other Ambulatory Visit (INDEPENDENT_AMBULATORY_CARE_PROVIDER_SITE_OTHER): Payer: Medicare Other

## 2023-04-04 ENCOUNTER — Ambulatory Visit (INDEPENDENT_AMBULATORY_CARE_PROVIDER_SITE_OTHER): Payer: Medicare Other | Admitting: Physician Assistant

## 2023-04-04 VITALS — BP 122/68 | HR 68 | Ht 71.0 in | Wt 190.0 lb

## 2023-04-04 DIAGNOSIS — K625 Hemorrhage of anus and rectum: Secondary | ICD-10-CM | POA: Diagnosis not present

## 2023-04-04 DIAGNOSIS — Z800 Family history of malignant neoplasm of digestive organs: Secondary | ICD-10-CM

## 2023-04-04 DIAGNOSIS — I5032 Chronic diastolic (congestive) heart failure: Secondary | ICD-10-CM | POA: Diagnosis not present

## 2023-04-04 DIAGNOSIS — D649 Anemia, unspecified: Secondary | ICD-10-CM | POA: Diagnosis not present

## 2023-04-04 DIAGNOSIS — I083 Combined rheumatic disorders of mitral, aortic and tricuspid valves: Secondary | ICD-10-CM

## 2023-04-04 DIAGNOSIS — I4821 Permanent atrial fibrillation: Secondary | ICD-10-CM | POA: Diagnosis not present

## 2023-04-04 DIAGNOSIS — N189 Chronic kidney disease, unspecified: Secondary | ICD-10-CM

## 2023-04-04 DIAGNOSIS — I080 Rheumatic disorders of both mitral and aortic valves: Secondary | ICD-10-CM

## 2023-04-04 DIAGNOSIS — K921 Melena: Secondary | ICD-10-CM | POA: Diagnosis not present

## 2023-04-04 LAB — CBC WITH DIFFERENTIAL/PLATELET
Basophils Absolute: 0 10*3/uL (ref 0.0–0.1)
Basophils Relative: 0.5 % (ref 0.0–3.0)
Eosinophils Absolute: 0.1 10*3/uL (ref 0.0–0.7)
Eosinophils Relative: 1.9 % (ref 0.0–5.0)
HCT: 34.6 % — ABNORMAL LOW (ref 39.0–52.0)
Hemoglobin: 11.4 g/dL — ABNORMAL LOW (ref 13.0–17.0)
Lymphocytes Relative: 11.3 % — ABNORMAL LOW (ref 12.0–46.0)
Lymphs Abs: 0.9 10*3/uL (ref 0.7–4.0)
MCHC: 32.8 g/dL (ref 30.0–36.0)
MCV: 90.3 fl (ref 78.0–100.0)
Monocytes Absolute: 0.9 10*3/uL (ref 0.1–1.0)
Monocytes Relative: 11.8 % (ref 3.0–12.0)
Neutro Abs: 5.9 10*3/uL (ref 1.4–7.7)
Neutrophils Relative %: 74.5 % (ref 43.0–77.0)
Platelets: 209 10*3/uL (ref 150.0–400.0)
RBC: 3.83 Mil/uL — ABNORMAL LOW (ref 4.22–5.81)
RDW: 16.2 % — ABNORMAL HIGH (ref 11.5–15.5)
WBC: 7.9 10*3/uL (ref 4.0–10.5)

## 2023-04-04 MED ORDER — NA SULFATE-K SULFATE-MG SULF 17.5-3.13-1.6 GM/177ML PO SOLN: 1.0000 | Freq: Once | ORAL | 0 refills | Status: AC

## 2023-04-04 NOTE — Progress Notes (Signed)
Chief Complaint: Rectal bleeding  HPI:    Daniel Love is a 83 year old male with a past medical history as listed below including A-fib on Coumadin (04/03/2023 echo with LVEF at 55-60%, mild pulmonary hypertension, moderate to severe mitral regurgitation and severe tricuspid regurgitation as well as moderate aortic valve regurgitation (findings similar to previous echo)), known to Dr. Ardis Hughs previously, who was referred to me by Chesley Noon, MD for a complaint of rectal bleeding.      10/02/2016 EGD for dysphagia with small hiatal hernia and gastritis.  Colonoscopy on the same day for history of colonic polyps with hemorrhoids and otherwise normal.    01/16/2023 cardiology appointment for permanent A-fib and discussed mitral and tricuspid regurgitation.  Last echo 11/13/2022 with EF 60-65%.  Moderate to severe MR and moderate TR.  Heart failure with preserved ejection fraction.    04/01/2023 office visit with PCP and at that time discussed having blood in his bowel movements for the past couple of months.  Describes seeing bright red and sometimes dark every time he uses the bathroom.  Discussed a family history of colon cancer in his uncle.    04/01/2023 CBC with hemoglobin of 11.7 (13.2 on 11/27/2021).  Iron panel normal.  CMP with BUN 49 and creatinine 1.88 elevated from his baseline.    Today, patient presents to clinic accompanied by his daughter who is also his POA.  Together they explain that over the past 2 months he has had rectal bleeding, he describes this as a mixture of bright red blood or sometimes darker maroon-colored blood anytime he has a bowel movement which is typically once a day.  He does not see any blood in between bowel movements.  Describes sometimes it can be a little bit and other times it is up to a third of a cup.  Denies any rectal pain.  Patient did have radiation for prostate cancer about 2 years ago.  Describes his stools have gotten a little looser ever since the  bleeding started.    Family history of colon cancer in his mother and his uncle.    Denies fever, chills, weight loss, nausea, vomiting, heartburn, reflux, abdominal pain, shortness of breath or palpitations.  Past Medical History:  Diagnosis Date   Anal fissure    hx of    Arthritis    Atrial fibrillation    maintaining normal sinus rhythm   Chronic anticoagulation    coumadin   Dyspepsia    Dysrhythmia    Afib   GERD (gastroesophageal reflux disease)    Glaucoma    History of colon polyps 2012   Colonoscopy-Dr. Benson Norway    History of echocardiogram    Echocardiogram 2/19: EF 60-65, normal wall motion, trivial AI, moderate to severe MR, mild LAE, mild RAE, mild to moderate TR, PASP 35   History of kidney stones    History of nuclear stress test    Myoview 2/19: EF 42, diffuse HK no significant ischemia, apical lateral scar   Hypertension    Prostate cancer    Skin cancer    skin cancer removed from left arm and back    Past Surgical History:  Procedure Laterality Date   BACK SURGERY  12/31/1977   for degenerative disc disease   COLONOSCOPY     IR RADIOLOGIST EVAL & MGMT  01/16/2022   IR RADIOLOGIST EVAL & MGMT  06/04/2022   PROSTATE BIOPSY     RADIOLOGY WITH ANESTHESIA N/A 02/21/2022   Procedure:  CT WITH ANESTHESIA CRYOABLATION;  Surgeon: Aletta Edouard, MD;  Location: [facility];  Service: Radiology;  Laterality: N/A;   TEE WITHOUT CARDIOVERSION N/A 03/04/2019   Procedure: TRANSESOPHAGEAL ECHOCARDIOGRAM (TEE);  Surgeon: Thayer Headings, MD;  Location: [facility] ENDOSCOPY;  Service: Cardiovascular;  Laterality: N/A;   toe removed     Right second toe   UPPER GASTROINTESTINAL ENDOSCOPY      Current Outpatient Medications  Medication Sig Dispense Refill   acetaminophen (TYLENOL) 500 MG tablet Take 1,000 mg by mouth every 6 (six) hours as needed (for pain.).     Boswellia-Glucosamine-Vit D (OSTEO BI-FLEX ONE PER DAY) TABS Take 1 tablet by mouth in the morning.     colchicine 0.6 MG  tablet Take 0.6 mg by mouth daily as needed (gout flare).     dapagliflozin propanediol (FARXIGA) 10 MG TABS tablet Take 1 tablet (10 mg total) by mouth daily before breakfast. 90 tablet 3   diphenhydramine-acetaminophen (TYLENOL PM) 25-500 MG TABS tablet Take 1 tablet by mouth at bedtime as needed (sleep).     furosemide (LASIX) 40 MG tablet Take 1 tablet (40 mg total) by mouth daily. 90 tablet 3   ibuprofen (ADVIL) 200 MG tablet Take 200-400 mg by mouth every 8 (eight) hours as needed (pain.).     isosorbide mononitrate (IMDUR) 30 MG 24 hr tablet Take 15 mg by mouth in the morning.     LUMIGAN 0.01 % SOLN Place 1 drop into both eyes daily. Midday     Menthol, Topical Analgesic, (ICY HOT BACK EX) Apply 1 application topically daily as needed (pain).     Multiple Vitamin (MULTIVITAMIN WITH MINERALS) TABS tablet Take 1 tablet by mouth in the morning. Centrum Silver     olmesartan-hydrochlorothiazide (BENICAR HCT) 40-25 MG tablet TAKE ONE TABLET BY MOUTH EVERY NIGHT AT BEDTIME (Patient taking differently: Take 1 tablet by mouth daily after breakfast.) 90 tablet 3   oxymetazoline (AFRIN) 0.05 % nasal spray Place 1 spray into both nostrils at bedtime as needed for congestion.     timolol (TIMOPTIC) 0.5 % ophthalmic solution 1 drop 2 (two) times daily.     warfarin (COUMADIN) 5 MG tablet TAKE 1 TABLET BY MOUTH DAILY AS DIRECTED BY COUMADIN CLINIC 100 tablet 1   No current facility-administered medications for this visit.    Allergies as of 04/04/2023 - Review Complete 04/04/2023  Allergen Reaction Noted   Amlodipine Hives and Swelling 05/31/2011   Lisinopril Cough 05/31/2011    Family History  Problem Relation Age of Onset   Cancer Mother        uncertain/possibly lung   Heart failure Father    Cancer Father        associated with her lungs   Heart attack Father    Colon cancer Maternal Grandmother    Stroke Neg Hx    Esophageal cancer Neg Hx    Rectal cancer Neg Hx    Stomach cancer  Neg Hx    Liver disease Neg Hx     Social History   Socioeconomic History   Marital status: Married    Spouse name: Not on file   Number of children: 3   Years of education: Not on file   Highest education level: Not on file  Occupational History   Occupation: Retired  Tobacco Use   Smoking status: Former    Packs/day: 1.00    Years: 7.00    Additional pack years: 0.00    Total pack years: 7.00  Types: Cigarettes    Quit date: 12/31/1966    Years since quitting: 56.2   Smokeless tobacco: Never  Vaping Use   Vaping Use: Never used  Substance and Sexual Activity   Alcohol use: Yes    Alcohol/week: 7.0 standard drinks of alcohol    Types: 7 Glasses of wine per week    Comment: 1 weekly   Drug use: No   Sexual activity: Not Currently  Other Topics Concern   Not on file  Social History Narrative   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 They share three daughters, two live local, one in [place]   09-26-18 Unable to ask abuse questions wife and daughter with him today.   Social Determinants of Health   Financial Resource Strain: Not on file  Food Insecurity: Not on file  Transportation Needs: Not on file  Physical Activity: Not on file  Stress: Not on file  Social Connections: Not on file  Intimate Partner Violence: Not on file    Review of Systems:    Constitutional: No weight loss, fever or chills Skin: No rash  Cardiovascular: No chest pain Respiratory: No SOB Gastrointestinal: See HPI and otherwise negative Genitourinary: No dysuria Neurological: No headache, dizziness or syncope Musculoskeletal: No new muscle or joint pain Hematologic: No bruising Psychiatric: No history of depression or anxiety    Physical Exam:  Vital signs: BP 122/68   Pulse 68   Ht 5\' 11"  (1.803 m)   Wt 190 lb (86.2 kg)   BMI 26.50 kg/m    Constitutional:   Pleasant Elderly Caucasian male appears to be in NAD, Well developed, Well nourished, alert  and cooperative Head:  Normocephalic and atraumatic. Eyes:   PEERL, EOMI. No icterus. Conjunctiva pink. Ears:  Normal auditory acuity. Neck:  Supple Throat: Oral cavity and pharynx without inflammation, swelling or lesion.  Respiratory: Respirations even and unlabored. Lungs clear to auscultation bilaterally.   No wheezes, crackles, or rhonchi.  Cardiovascular: Normal S1, S2. No MRG. Regular rate and rhythm. No peripheral edema, cyanosis or pallor.  Gastrointestinal:  Soft, nondistended, mild left sided ttp,  No rebound or guarding. Normal bowel sounds. No appreciable masses or hepatomegaly. Rectal: External: Dark/maroon blood residue around the rectum, no external hemorrhoids, normal sphincter tone; internal: Grade 2 hemorrhoids with no obvious bleeding; anoscopy: Question radiation proctitis but no active oozing on my exam Msk:  Symmetrical without gross deformities. Without edema, no deformity or joint abnormality.  Neurologic:  Alert and  oriented x4;  grossly normal neurologically.  Skin:   Dry and intact without significant lesions or rashes. Psychiatric:  Demonstrates good judgement and reason without abnormal affect or behaviors.  RELEVANT LABS AND IMAGING: CBC    Component Value Date/Time   WBC 7.5 02/22/2022 0443   RBC 4.13 (L) 02/22/2022 0443   HGB 12.3 (L) 02/22/2022 0443   HGB 13.1 03/20/2021 1154   HCT 39.0 02/22/2022 0443   HCT 39.7 03/20/2021 1154   PLT 135 (L) 02/22/2022 0443   PLT 141 (L) 03/20/2021 1154   MCV 94.4 02/22/2022 0443   MCV 90 03/20/2021 1154   MCH 29.8 02/22/2022 0443   MCHC 31.5 02/22/2022 0443   RDW 14.9 02/22/2022 0443   RDW 14.3 03/20/2021 1154   LYMPHSABS 0.7 02/22/2022 0443   LYMPHSABS 1.2 08/30/2017 1158   MONOABS 1.1 (H) 02/22/2022 0443   EOSABS 0.1 02/22/2022 0443   EOSABS 0.1 08/30/2017 1158   BASOSABS 0.0 02/22/2022 0443  BASOSABS 0.0 08/30/2017 1158    CMP     Component Value Date/Time   NA 143 01/16/2023 1121   K 4.2  01/16/2023 1121   CL 101 01/16/2023 1121   CO2 25 01/16/2023 1121   GLUCOSE 92 01/16/2023 1121   GLUCOSE 105 (H) 02/22/2022 0443   BUN 40 (H) 01/16/2023 1121   CREATININE 1.52 (H) 01/16/2023 1121   CALCIUM 9.8 01/16/2023 1121   GFRNONAA 42 (L) 02/22/2022 0443   GFRAA 72 02/23/2019 1538    Assessment: 1.  Hematochezia: For the past 2 months once daily up to 1/3 cup of blood at times, hemoglobin has dropped from 12.3 at last check 02/22/2022 to 11.7 on 04/01/2023, and anoscopy today with no acute bleeding, question radiation proctitis but no active oozing on my exam, last colonoscopy in 2017 was normal; consider radiation proctitis versus polyp versus AVM versus other exacerbated by Coumadin 2.  A-fib on Coumadin 3.  CKD 4.  Anemia: Likely due to blood loss 5.  Mitral valve and tricuspid valve regurgitation 6.  Family history of colon cancer: In patient's mother and uncle  Plan: 1.  Discussed case with Dr. Loletha Carrow today.  Patient then elected for a sooner procedure with Dr. Carlean Purl.  He was scheduled for an EGD and colonoscopy in the [facility] on 04/15/2023 for further evaluation of hematochezia.  This will allow for washout of his Coumadin.  Will send clearance to his cardiologist who he is seeing tomorrow in regards to Coumadin and cardiac clearance for procedure in the [place].  Patient was advised to hold his Coumadin for 7 days prior to time procedure.  Likely he will need a Lovenox bridge.  Cardiology will be requested to help with this process. 2.  Scheduled patient for EGD and colonoscopy in the [place] with Dr. Carlean Purl.  Did provide the patient a detailed list of risks for the procedures and he agrees to proceed.  Will confirm with our anesthesiologist that this case can be done in the [facility].  There is question of radiation proctitis and will discuss further with Dr. Carlean Purl as well to see if procedure in the hospital would be better suited for the patient. 3.  Patient will have a repeat CBC today and  another next Friday prior to time of scheduled procedures 4.  Discussed with the patient and his daughter who is POA that if the bleeding increases in frequency or amount or he develops further symptoms including palpitations, chest pain, shortness of breath or fatigue then he needs to go ahead to the ER. 5.  Patient to follow in clinic per recommendations after time of procedure.  He will continue to follow with Dr. Carlean Purl in lieu of Dr. Ardis Hughs absence.  Ellouise Newer, PA-C [facility] 04/04/2023, 9:09 AM  Cc: Chesley Noon, MD

## 2023-04-04 NOTE — Telephone Encounter (Signed)
Pt has appt 04/05/23 with Dr. Gasper Sells. I will add need pre op clearance to appt notes. I will update all parties involved.

## 2023-04-04 NOTE — Telephone Encounter (Signed)
[facility] Pre-operative Risk Assessment     Request for surgical clearance:     Endoscopy Procedure  What type of surgery is being performed?     EGD/Colon  When is this surgery scheduled?     Monday 04/15/23  What type of clearance is required ?   Pharmacy  Are there any medications that need to be held prior to surgery and how long? Coumdain 5-7 days  Practice name and name of physician performing surgery?      [facility]  What is your office phone and fax number?      Phone- [phone]  [phone]  Anesthesia type (None, local, MAC, general) ?       MAC

## 2023-04-04 NOTE — Telephone Encounter (Signed)
Preoperative team, patient has follow-up appointment with cardiology tomorrow 04/05/23.  Pharmacy has addressed recommendations for holding anticoagulation.  I will defer preoperative cardiac evaluation to office visit tomorrow.  Please add preoperative cardiac evaluation to appointment note.  Thank you for your help.  Jossie Ng. Shavonne Ambroise NP-C     04/04/2023, 4:37 PM [facility] [place] Office [phone] Fax [phone]

## 2023-04-04 NOTE — Telephone Encounter (Signed)
Patient with diagnosis of atrial fibrillation on warfarin for anticoagulation.    Procedure: EGD/colonoscopy Date of procedure: 04/15/23   CHA2DS2-VASc Score = 4   This indicates a 4.8% annual risk of stroke. The patient's score is based upon: CHF History: 1 HTN History: 1 Diabetes History: 0 Stroke History: 0 Vascular Disease History: 0 Age Score: 2 Gender Score: 0    CrCl 46 Platelet count 209  Per office protocol, patient can hold warfarin for 5 days prior to procedure.   Patient will not need bridging with Lovenox (enoxaparin) around procedure.  **This guidance is not considered finalized until pre-operative APP has relayed final recommendations.**

## 2023-04-04 NOTE — Patient Instructions (Addendum)
Your provider has requested that you go to the basement level for lab work before leaving today. Press "B" on the elevator. The lab is located at the first door on the left as you exit the elevator.  Please stop by our office the Friday 04/12/23 for a updated blood count before procedure.  You will be contaced by our office prior to your procedure for directions on holding your Coumadin/Warfarin.  If you do not hear from our office 1 week prior to your scheduled procedure, please call [phone] to discuss.   You have been scheduled for an endoscopy and colonoscopy. Please follow the written instructions given to you at your visit today. Please pick up your prep supplies at the pharmacy within the next 1-3 days. If you use inhalers (even only as needed), please bring them with you on the day of your procedure.  _______________________________________________________  If your blood pressure at your visit was 140/90 or greater, please contact your primary care physician to follow up on this.  _______________________________________________________  If you are age 83 or older, your body mass index should be between 23-30. Your Body mass index is 26.5 kg/m. If this is out of the aforementioned range listed, please consider follow up with your Primary Care Provider.  If you are age 107 or younger, your body mass index should be between 19-25. Your Body mass index is 26.5 kg/m. If this is out of the aformentioned range listed, please consider follow up with your Primary Care Provider.   ________________________________________________________  The [facility] providers would like to encourage you to use [facility] to communicate with providers for non-urgent requests or questions.  Due to long hold times on the telephone, sending your provider a message by [facility] may be a faster and more efficient way to get a response.  Please allow 48 business hours for a response.  Please remember that this is  for non-urgent requests.  _______________________________________________________

## 2023-04-05 ENCOUNTER — Encounter: Payer: Self-pay | Admitting: Internal Medicine

## 2023-04-05 ENCOUNTER — Ambulatory Visit: Payer: Medicare Other | Attending: Internal Medicine | Admitting: Internal Medicine

## 2023-04-05 VITALS — BP 112/60 | HR 68 | Ht 71.0 in | Wt 192.0 lb

## 2023-04-05 DIAGNOSIS — I4821 Permanent atrial fibrillation: Secondary | ICD-10-CM | POA: Diagnosis not present

## 2023-04-05 DIAGNOSIS — I340 Nonrheumatic mitral (valve) insufficiency: Secondary | ICD-10-CM | POA: Insufficient documentation

## 2023-04-05 DIAGNOSIS — I361 Nonrheumatic tricuspid (valve) insufficiency: Secondary | ICD-10-CM | POA: Insufficient documentation

## 2023-04-05 DIAGNOSIS — D6869 Other thrombophilia: Secondary | ICD-10-CM | POA: Diagnosis not present

## 2023-04-05 NOTE — Patient Instructions (Signed)
Medication Instructions:  Your physician has recommended you make the following change in your medication:  STOP: warfarin (Coumadin)   *If you need a refill on your cardiac medications before your next appointment, please call your pharmacy*   Lab Work: NONE If you have labs (blood work) drawn today and your tests are completely normal, you will receive your results only by: MyChart Message (if you have MyChart) OR A paper copy in the mail If you have any lab test that is abnormal or we need to change your treatment, we will call you to review the results.   Testing/Procedures: NONE   Follow-Up: At [facility], you and your health needs are our priority.  As part of our continuing mission to provide you with exceptional heart care, we have created designated Provider Care Teams.  These Care Teams include your primary Cardiologist (physician) and Advanced Practice Providers (APPs -  Physician Assistants and Nurse Practitioners) who all work together to provide you with the care you need, when you need it.   Your next appointment:   5-6 Months  Provider:   Riley Lam, MD or Tereso Newcomer, [place]

## 2023-04-10 ENCOUNTER — Ambulatory Visit: Payer: Medicare Other | Attending: Cardiovascular Disease

## 2023-04-10 DIAGNOSIS — Z7901 Long term (current) use of anticoagulants: Secondary | ICD-10-CM | POA: Diagnosis not present

## 2023-04-10 DIAGNOSIS — I4821 Permanent atrial fibrillation: Secondary | ICD-10-CM

## 2023-04-10 LAB — POCT INR: INR: 1.3 — AB (ref 2.0–3.0)

## 2023-04-10 NOTE — Patient Instructions (Addendum)
Description   Continue to HOLD Warfarin pre-procedure and resume Warfarin on 4/15 or as instructed by provider.  Normal dose: Warfarin 1 tablet daily.  Recheck INR 1 week post procedure.  Call us with any update or changes #[phone].

## 2023-04-12 ENCOUNTER — Other Ambulatory Visit (INDEPENDENT_AMBULATORY_CARE_PROVIDER_SITE_OTHER): Payer: Medicare Other

## 2023-04-12 ENCOUNTER — Other Ambulatory Visit: Payer: Self-pay

## 2023-04-12 DIAGNOSIS — D649 Anemia, unspecified: Secondary | ICD-10-CM

## 2023-04-12 DIAGNOSIS — K921 Melena: Secondary | ICD-10-CM

## 2023-04-12 LAB — CBC WITH DIFFERENTIAL/PLATELET
Basophils Absolute: 0 10*3/uL (ref 0.0–0.1)
Basophils Relative: 0.5 % (ref 0.0–3.0)
Eosinophils Absolute: 0.2 10*3/uL (ref 0.0–0.7)
Eosinophils Relative: 2.6 % (ref 0.0–5.0)
HCT: 33.3 % — ABNORMAL LOW (ref 39.0–52.0)
Hemoglobin: 10.9 g/dL — ABNORMAL LOW (ref 13.0–17.0)
Lymphocytes Relative: 15.9 % (ref 12.0–46.0)
Lymphs Abs: 1.1 10*3/uL (ref 0.7–4.0)
MCHC: 32.9 g/dL (ref 30.0–36.0)
MCV: 89.9 fl (ref 78.0–100.0)
Monocytes Absolute: 0.9 10*3/uL (ref 0.1–1.0)
Monocytes Relative: 13.7 % — ABNORMAL HIGH (ref 3.0–12.0)
Neutro Abs: 4.5 10*3/uL (ref 1.4–7.7)
Neutrophils Relative %: 67.3 % (ref 43.0–77.0)
Platelets: 216 10*3/uL (ref 150.0–400.0)
RBC: 3.7 Mil/uL — ABNORMAL LOW (ref 4.22–5.81)
RDW: 16.2 % — ABNORMAL HIGH (ref 11.5–15.5)
WBC: 6.8 10*3/uL (ref 4.0–10.5)

## 2023-04-15 ENCOUNTER — Telehealth: Payer: Self-pay | Admitting: Internal Medicine

## 2023-04-15 ENCOUNTER — Ambulatory Visit (AMBULATORY_SURGERY_CENTER): Payer: Medicare Other | Admitting: Internal Medicine

## 2023-04-15 ENCOUNTER — Encounter: Payer: Self-pay | Admitting: Internal Medicine

## 2023-04-15 VITALS — BP 103/63 | HR 68 | Temp 98.0°F | Resp 13 | Ht 71.0 in | Wt 190.0 lb

## 2023-04-15 DIAGNOSIS — I10 Essential (primary) hypertension: Secondary | ICD-10-CM | POA: Diagnosis not present

## 2023-04-15 DIAGNOSIS — D122 Benign neoplasm of ascending colon: Secondary | ICD-10-CM

## 2023-04-15 DIAGNOSIS — K921 Melena: Secondary | ICD-10-CM

## 2023-04-15 DIAGNOSIS — K627 Radiation proctitis: Secondary | ICD-10-CM | POA: Insufficient documentation

## 2023-04-15 DIAGNOSIS — D649 Anemia, unspecified: Secondary | ICD-10-CM | POA: Diagnosis not present

## 2023-04-15 DIAGNOSIS — E119 Type 2 diabetes mellitus without complications: Secondary | ICD-10-CM | POA: Diagnosis not present

## 2023-04-15 DIAGNOSIS — D120 Benign neoplasm of cecum: Secondary | ICD-10-CM

## 2023-04-15 DIAGNOSIS — D124 Benign neoplasm of descending colon: Secondary | ICD-10-CM

## 2023-04-15 MED ORDER — SODIUM CHLORIDE 0.9 % IV SOLN: 500.0000 mL | Freq: Once | INTRAVENOUS | Status: DC

## 2023-04-15 NOTE — Progress Notes (Unsigned)
Pt's states no medical or surgical changes since previsit or office visit. 

## 2023-04-15 NOTE — Patient Instructions (Addendum)
There are hemorrhoids and slight radiation changes in the rectum. I must assume these were causing the bleeding though the radiation changes are minor. As you know, though, blood thinners like warfarin make it easier to bleed.  Three tiny colon polyps removed and I also saw diverticulosis.   I will have the polyps analyzed.  The upper endoscopy exam was ok - small hiatal hernia, like before.  I am going to communicate with your heart doctor and will get back to you about next steps. We may have to cauterize the rectum to reduce further bleeding or treat hemorrhoids or both.   I appreciate the opportunity to care for you. Iva Boop, MD, FACG   YOU HAD AN ENDOSCOPIC PROCEDURE TODAY AT THE [facility]:   Refer to the procedure report that was given to you for any specific questions about what was found during the examination.  If the procedure report does not answer your questions, please call your gastroenterologist to clarify.  If you requested that your care partner not be given the details of your procedure findings, then the procedure report has been included in a sealed envelope for you to review at your convenience later.  YOU SHOULD EXPECT: Some feelings of bloating in the abdomen. Passage of more gas than usual.  Walking can help get rid of the air that was put into your GI tract during the procedure and reduce the bloating. If you had a lower endoscopy (such as a colonoscopy or flexible sigmoidoscopy) you may notice spotting of blood in your stool or on the toilet paper. If you underwent a bowel prep for your procedure, you may not have a normal bowel movement for a few days.  Please Note:  You might notice some irritation and congestion in your nose or some drainage.  This is from the oxygen used during your procedure.  There is no need for concern and it should clear up in a day or so.  SYMPTOMS TO REPORT IMMEDIATELY:  Following lower endoscopy (colonoscopy or  flexible sigmoidoscopy):  Excessive amounts of blood in the stool  Significant tenderness or worsening of abdominal pains  Swelling of the abdomen that is new, acute  Fever of 100F or higher  Following upper endoscopy (EGD)  Vomiting of blood or coffee ground material  New chest pain or pain under the shoulder blades  Painful or persistently difficult swallowing  New shortness of breath  Fever of 100F or higher  Black, tarry-looking stools  For urgent or emergent issues, a gastroenterologist can be reached at any hour by calling [phone]. Do not use MyChart messaging for urgent concerns.    DIET:  We do recommend a small meal at first, but then you may proceed to your regular diet.  Drink plenty of fluids but you should avoid alcoholic beverages for 24 hours.  ACTIVITY:  You should plan to take it easy for the rest of today and you should NOT DRIVE or use heavy machinery until tomorrow (because of the sedation medicines used during the test).    FOLLOW UP: Our staff will call the number listed on your records the next business day following your procedure.  We will call around 7:15- 8:00 am to check on you and address any questions or concerns that you may have regarding the information given to you following your procedure. If we do not reach you, we will leave a message.     If any biopsies were taken you will  be contacted by phone or by letter within the next 1-3 weeks.  Please call us at [phone] if you have not heard about the biopsies in 3 weeks.    SIGNATURES/CONFIDENTIALITY: You and/or your care partner have signed paperwork which will be entered into your electronic medical record.  These signatures attest to the fact that that the information above on your After Visit Summary has been reviewed and is understood.  Full responsibility of the confidentiality of this discharge information lies with you and/or your care-partner.

## 2023-04-15 NOTE — Progress Notes (Unsigned)
History and Physical Interval Note:  04/15/2023 1:11 PM  Daniel Love  has presented today for endoscopic procedure(s), with the diagnosis of  Encounter Diagnoses  Name Primary?   Anemia, unspecified type Yes   Permanent atrial fibrillation    Hematochezia   .  The various methods of evaluation and treatment have been discussed with the patient and/or family. After consideration of risks, benefits and other options for treatment, the patient has consented to  the endoscopic procedure(s).   The patient's history has been reviewed, patient examined, no change in status, stable for endoscopic procedure(s).  I have reviewed the patient's chart and labs.  Questions were answered to the patient's satisfaction.     Iva Boop, MD, [facility]

## 2023-04-15 NOTE — Op Note (Addendum)
[facility] Patient Name: Daniel Love Procedure Date: 04/15/2023 1:14 PM MRN: [id number] Endoscopist: Iva Boop , MD, [id number] Age: 83 Referring MD:  Date of Birth: 11-06-1940 Gender: Male Account #: [id number] Procedure:                Colonoscopy Indications:              Hematochezia Medicines:                Monitored Anesthesia Care Procedure:                Pre-Anesthesia Assessment:                           - Prior to the procedure, a History and Physical                            was performed, and patient medications and                            allergies were reviewed. The patient's tolerance of                            previous anesthesia was also reviewed. The risks                            and benefits of the procedure and the sedation                            options and risks were discussed with the patient.                            All questions were answered, and informed consent                            was obtained. Prior Anticoagulants: The patient                            last took Coumadin (warfarin) 11 days prior to the                            procedure. ASA Grade Assessment: III - A patient                            with severe systemic disease. After reviewing the                            risks and benefits, the patient was deemed in                            satisfactory condition to undergo the procedure.                           After obtaining informed consent, the colonoscope  was passed under direct vision. Throughout the                            procedure, the patient's blood pressure, pulse, and                            oxygen saturations were monitored continuously. The                            Olympus CF-HQ190L ([id number]) Colonoscope was                            introduced through the anus and advanced to the the                            terminal ileum, with  identification of the                            appendiceal orifice and IC valve. The colonoscopy                            was somewhat difficult due to significant looping.                            Successful completion of the procedure was aided by                            straightening and shortening the scope to obtain                            bowel loop reduction, using scope torsion, applying                            abdominal pressure and lavage. The patient                            tolerated the procedure well. The quality of the                            bowel preparation was excellent. The terminal                            ileum, ileocecal valve, appendiceal orifice, and                            rectum were photographed. The bowel preparation                            used was SUPREP via split dose instruction. Scope In: 1:24:12 PM Scope Out: 1:44:35 PM Scope Withdrawal Time: 0 hours 11 minutes 38 seconds  Total Procedure Duration: 0 hours 20 minutes 23 seconds  Findings:                 The digital rectal exam was normal.  Three sessile polyps were found in the descending                            colon, ascending colon and cecum. The polyps were                            diminutive in size. These polyps were removed with                            a cold snare. Resection and retrieval were                            complete. Verification of patient identification                            for the specimen was done. Estimated blood loss was                            minimal.                           External and internal hemorrhoids were found.                           A few localized angioectasias were found in the                            distal rectum.                           Multiple diverticula were found in the sigmoid                            colon.                           A localized area of mildly erythematous  mucosa was                            found in the cecum.                           The exam was otherwise without abnormality on                            direct and retroflexion views. Complications:            No immediate complications. Estimated Blood Loss:     Estimated blood loss was minimal. Impression:               - Three diminutive polyps in the descending colon,                            in the ascending colon and in the cecum, removed  with a cold snare. Resected and retrieved.                           - External and internal hemorrhoids.                           - A few rectal angioectasias. Consistent w/                            radiation associated vascular ectasia (RAVE) though                            seems to be on left and minimal so not clear it was                            bleeding                           - Diverticulosis in the sigmoid colon.                           - Erythematous mucosa in the cecum.                           - The examination was otherwise normal on direct                            and retroflexion views. Recommendation:           - Patient has a contact number available for                            emergencies. The signs and symptoms of potential                            delayed complications were discussed with the                            patient. Return to normal activities tomorrow.                            Written discharge instructions were provided to the                            patient.                           - Resume previous diet.                           - Continue present medications.                           - Await pathology results.                           - I will coordinate  with cardiology - sounds like                            he should get back on warfarin - I am thinking may                            need to ablate radiation associated vascular                             ectasia of rectum vs band hmeorrhoids or both. Some                            of the stools were reported to be maroon and maybe                            even melenic - so ? more proximal bleeding. The                            cecum showed mild barotrauma as also occurred in                            2017 (slight heme and erythematous spots) so                            angiodysplasia in cecum could have been missed                            though did not see any definitive changes. In sum I                            am not confident as to what bled. Bleeding stopped                            off warfarin. Iva Boop, MD 04/15/2023 2:10:01 PM This report has been signed electronically.

## 2023-04-15 NOTE — Progress Notes (Unsigned)
Called to room to assist during endoscopic procedure.  Patient ID and intended procedure confirmed with present staff. Received instructions for my participation in the procedure from the performing physician.  

## 2023-04-15 NOTE — Progress Notes (Unsigned)
Report to PACU, RN, vss, BBS= Clear.  

## 2023-04-15 NOTE — Op Note (Signed)
[facility] Patient Name: Brelan Hannen Procedure Date: 04/15/2023 1:15 PM MRN: [id number] Endoscopist: Iva Boop , MD, [id number] Age: 83 Referring MD:  Date of Birth: 1940-10-09 Gender: Male Account #: [id number] Procedure:                Upper GI endoscopy Indications:              Anemia Medicines:                Monitored Anesthesia Care Procedure:                Pre-Anesthesia Assessment:                           - Prior to the procedure, a History and Physical                            was performed, and patient medications and                            allergies were reviewed. The patient's tolerance of                            previous anesthesia was also reviewed. The risks                            and benefits of the procedure and the sedation                            options and risks were discussed with the patient.                            All questions were answered, and informed consent                            was obtained. Prior Anticoagulants: The patient                            last took Coumadin (warfarin) 11 days prior to the                            procedure. ASA Grade Assessment: III - A patient                            with severe systemic disease. After reviewing the                            risks and benefits, the patient was deemed in                            satisfactory condition to undergo the procedure.                           After obtaining informed consent, the endoscope was  passed under direct vision. Throughout the                            procedure, the patient's blood pressure, pulse, and                            oxygen saturations were monitored continuously. The                            Olympus scope [id number] was introduced through the                            mouth, and advanced to the second part of duodenum.                            The upper GI endoscopy was  accomplished without                            difficulty. The patient tolerated the procedure                            well. Scope In: Scope Out: Findings:                 A small hiatal hernia was present.                           The exam was otherwise without abnormality.                           The cardia and gastric fundus were normal on                            retroflexion. Complications:            No immediate complications. Estimated Blood Loss:     Estimated blood loss: none. Impression:               - Small hiatal hernia.                           - The examination was otherwise normal. No cause of                            anemia, hematochezia or melena (there was some ?)                            here                           - No specimens collected. Recommendation:           - Patient has a contact number available for                            emergencies. The signs and symptoms of potential  delayed complications were discussed with the                            patient. Return to normal activities tomorrow.                            Written discharge instructions were provided to the                            patient.                           - Resume previous diet.                           - Continue present medications.                           - See the other procedure note for documentation of                            additional recommendations. Note that warfarin has                            been stopped for now. I will coordinate with                            cardiology Iva Boop, MD 04/15/2023 1:59:14 PM This report has been signed electronically.

## 2023-04-16 ENCOUNTER — Telehealth: Payer: Self-pay

## 2023-04-16 NOTE — Telephone Encounter (Signed)
Patient needs to schedule a flex sig to ablate radiation proctitis - will use APC or RFA  I can do it April 25 I think - have procedures that day and should be able to follow them - I am also purple doc (may need to get approval from [facility] RN)  Prep  1)  4 dulcolax po evening before 2) NPO after MN 3) fleet enema x 1 - 1 hr before arrival time at [facility]    Encounter Diagnosis  Name Primary?   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

## 2023-04-16 NOTE — Telephone Encounter (Signed)
No answer no voice mail  

## 2023-04-16 NOTE — Telephone Encounter (Signed)
Follow up call placed, no answer and no VM. 

## 2023-04-19 ENCOUNTER — Other Ambulatory Visit: Payer: Self-pay

## 2023-04-19 DIAGNOSIS — K627 Radiation proctitis: Secondary | ICD-10-CM

## 2023-04-19 NOTE — Telephone Encounter (Signed)
PT daughter is returning call. Please advise. [phone]

## 2023-04-19 NOTE — Telephone Encounter (Signed)
Inbound call from Daughter following up on call, Would like to know when to plan for procedure. Her number is [phone].

## 2023-04-19 NOTE — Telephone Encounter (Signed)
Pt has been scheduled for Flex on 04/25/23 at 12 noon at [facility] with CG.   Flex scheduled, pt daughter instructed and medications reviewed.  Patient instructions sent to My Chart  Patient to call with any questions or concerns.

## 2023-04-22 ENCOUNTER — Ambulatory Visit: Payer: Medicare Other

## 2023-04-24 NOTE — Anesthesia Preprocedure Evaluation (Signed)
Anesthesia Evaluation  Patient identified by MRN, date of birth, ID band Patient awake    Reviewed: Allergy & Precautions, H&P , NPO status , Patient's Chart, lab work & pertinent test results  Airway Mallampati: II  TM Distance: >3 FB Neck ROM: Full    Dental no notable dental hx. (+) Chipped, Dental Advisory Given   Pulmonary neg pulmonary ROS, former smoker   Pulmonary exam normal breath sounds clear to auscultation       Cardiovascular hypertension, Normal cardiovascular exam+ dysrhythmias Atrial Fibrillation  Rhythm:Regular Rate:Normal  10/2022 TTE  1. Left ventricular ejection fraction, by estimation, is 60 to 65%. The  left ventricle has normal function. The left ventricle has no regional  wall motion abnormalities. There is mild left ventricular hypertrophy.  Left ventricular diastolic parameters  are indeterminate.   2. Right ventricular systolic function is normal. The right ventricular  size is moderately enlarged. There is mildly elevated pulmonary artery  systolic pressure. The estimated right ventricular systolic pressure is  38.0 mmHg.   3. Left atrial size was severely dilated.   4. Right atrial size was severely dilated.   5. Moderate to severe mitral valve regurgitation.   6. Tricuspid valve regurgitation is moderate.   7. The aortic valve is tricuspid. Aortic valve regurgitation is mild.   8. 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   9. The inferior vena cava is normal in size with <50% respiratory  variability, suggesting right atrial pressure of 8 mmHg.     Neuro/Psych negative neurological ROS  negative psych ROS   GI/Hepatic negative GI ROS, Neg liver ROS,GERD  ,,  Endo/Other  negative endocrine ROS    Renal/GU negative Renal ROS   Prostate CA negative genitourinary   Musculoskeletal negative musculoskeletal ROS (+)    Abdominal    Peds negative pediatric ROS (+)  Hematology negative hematology ROS (+)   Anesthesia Other Findings All: lisinopril, amlodipine  Reproductive/Obstetrics negative OB ROS                             Anesthesia Physical Anesthesia Plan  ASA: 3  Anesthesia Plan: MAC   Post-op Pain Management:    Induction: Intravenous  PONV Risk Score and Plan: Treatment may vary due to age or medical condition  Airway Management Planned: Natural Airway and Nasal Cannula  Additional Equipment: None  Intra-op Plan:   Post-operative Plan:   Informed Consent: I have reviewed the patients History and Physical, chart, labs and discussed the procedure including the risks, benefits and alternatives for the proposed anesthesia with the patient or authorized representative who has indicated his/her understanding and acceptance.     Dental advisory given  Plan Discussed with:   Anesthesia Plan Comments:        Anesthesia Quick Evaluation

## 2023-04-25 ENCOUNTER — Ambulatory Visit (HOSPITAL_COMMUNITY)
Admission: RE | Admit: 2023-04-25 | Discharge: 2023-04-25 | Disposition: A | Discharge status: Home / Self Care | Payer: Medicare Other | Attending: Internal Medicine | Admitting: Internal Medicine

## 2023-04-25 ENCOUNTER — Encounter (HOSPITAL_COMMUNITY): Payer: Self-pay | Admitting: Internal Medicine

## 2023-04-25 ENCOUNTER — Encounter (HOSPITAL_COMMUNITY)
Admission: RE | Disposition: A | Discharge status: Home / Self Care | Payer: Self-pay | Source: Home / Self Care | Attending: Internal Medicine

## 2023-04-25 ENCOUNTER — Ambulatory Visit (HOSPITAL_COMMUNITY): Payer: Medicare Other | Admitting: Certified Registered"

## 2023-04-25 ENCOUNTER — Ambulatory Visit (HOSPITAL_BASED_OUTPATIENT_CLINIC_OR_DEPARTMENT_OTHER): Payer: Medicare Other | Admitting: Certified Registered"

## 2023-04-25 ENCOUNTER — Other Ambulatory Visit: Payer: Self-pay

## 2023-04-25 DIAGNOSIS — Z87891 Personal history of nicotine dependence: Secondary | ICD-10-CM | POA: Insufficient documentation

## 2023-04-25 DIAGNOSIS — I4891 Unspecified atrial fibrillation: Secondary | ICD-10-CM | POA: Insufficient documentation

## 2023-04-25 DIAGNOSIS — Z8249 Family history of ischemic heart disease and other diseases of the circulatory system: Secondary | ICD-10-CM | POA: Insufficient documentation

## 2023-04-25 DIAGNOSIS — Z8546 Personal history of malignant neoplasm of prostate: Secondary | ICD-10-CM | POA: Diagnosis not present

## 2023-04-25 DIAGNOSIS — K5520 Angiodysplasia of colon without hemorrhage: Secondary | ICD-10-CM

## 2023-04-25 DIAGNOSIS — I10 Essential (primary) hypertension: Secondary | ICD-10-CM

## 2023-04-25 DIAGNOSIS — K648 Other hemorrhoids: Secondary | ICD-10-CM

## 2023-04-25 DIAGNOSIS — K627 Radiation proctitis: Secondary | ICD-10-CM | POA: Diagnosis not present

## 2023-04-25 HISTORY — PX: FLEXIBLE SIGMOIDOSCOPY: SHX5431

## 2023-04-25 HISTORY — PX: HOT HEMOSTASIS: SHX5433

## 2023-04-25 LAB — GLUCOSE, CAPILLARY
Glucose-Capillary: 83 mg/dL (ref 70–99)
Glucose-Capillary: 84 mg/dL (ref 70–99)

## 2023-04-25 SURGERY — SIGMOIDOSCOPY, FLEXIBLE
Anesthesia: Monitor Anesthesia Care

## 2023-04-25 MED ORDER — LACTATED RINGERS IV SOLN: INTRAVENOUS | Status: DC

## 2023-04-25 MED ORDER — LACTATED RINGERS IV SOLN: INTRAVENOUS | Status: DC | PRN

## 2023-04-25 MED ORDER — LIDOCAINE 2% (20 MG/ML) 5 ML SYRINGE
INTRAMUSCULAR | Status: DC | PRN
  Administered 2023-04-25: 60 mg via INTRAVENOUS

## 2023-04-25 MED ORDER — PROPOFOL 500 MG/50ML IV EMUL
INTRAVENOUS | Status: DC | PRN
  Administered 2023-04-25: 125 ug/kg/min via INTRAVENOUS

## 2023-04-25 MED ORDER — SODIUM CHLORIDE 0.9 % IV SOLN: INTRAVENOUS | Status: DC

## 2023-04-25 MED ORDER — PROPOFOL 10 MG/ML IV BOLUS
INTRAVENOUS | Status: DC | PRN
  Administered 2023-04-25: 20 mg via INTRAVENOUS

## 2023-04-25 MED ORDER — EPHEDRINE SULFATE-NACL 50-0.9 MG/10ML-% IV SOSY
PREFILLED_SYRINGE | INTRAVENOUS | Status: DC | PRN
  Administered 2023-04-25: 10 mg via INTRAVENOUS
  Administered 2023-04-25: 5 mg via INTRAVENOUS

## 2023-04-25 NOTE — Anesthesia Procedure Notes (Signed)
Procedure Name: MAC Date/Time: 04/25/2023 10:31 AM  Performed by: Sindy Guadeloupe, CRNAPre-anesthesia Checklist: Patient identified, Emergency Drugs available, Suction available, Patient being monitored and Timeout performed Oxygen Delivery Method: Simple face mask Placement Confirmation: positive ETCO2

## 2023-04-25 NOTE — Transfer of Care (Signed)
Immediate Anesthesia Transfer of Care Note  Patient: Daniel Love  Procedure(s) Performed: FLEXIBLE SIGMOIDOSCOPY HOT HEMOSTASIS (ARGON PLASMA COAGULATION/BICAP)  Patient Location: PACU and Endoscopy Unit  Anesthesia Type:MAC  Level of Consciousness: drowsy and responds to stimulation  Airway & Oxygen Therapy: Patient Spontanous Breathing and Patient connected to face mask oxygen  Post-op Assessment: Report given to RN and Post -op Vital signs reviewed and stable  Post vital signs: Reviewed and stable  Last Vitals:  Vitals Value Taken Time  BP 99/63   Temp    Pulse 70 04/25/23 1055  Resp 21 04/25/23 1055  SpO2 100 % 04/25/23 1055  Vitals shown include unvalidated device data.  Last Pain:  Vitals:   04/25/23 1012  TempSrc: Temporal  PainSc: 0-No pain         Complications: No notable events documented.

## 2023-04-25 NOTE — Discharge Instructions (Addendum)
I cauterized the radiation changes in the rectum. I will communicate with Dr. Izora Ribas that you can retry warfarin.  I will stand by - and if there is more bleeding (hope not) let me know.  I appreciate the opportunity to care for you. Iva Boop, MD, FACG  YOU HAD AN ENDOSCOPIC PROCEDURE TODAY: Refer to the procedure report and other information in the discharge instructions given to you for any specific questions about what was found during the examination. If this information does not answer your questions, please call Dr. Marvell Fuller office at [phone] to clarify.   YOU SHOULD EXPECT: Some feelings of bloating in the abdomen. Passage of more gas than usual. Walking can help get rid of the air that was put into your GI tract during the procedure and reduce the bloating. If you had a lower endoscopy (such as a colonoscopy or flexible sigmoidoscopy) you may notice spotting of blood in your stool or on the toilet paper. Some abdominal soreness may be present for a day or two, also.  DIET: Your first meal following the procedure should be a light meal and then it is ok to progress to your normal diet. A half-sandwich or bowl of soup is an example of a good first meal. Heavy or fried foods are harder to digest and may make you feel nauseous or bloated. Drink plenty of fluids but you should avoid alcoholic beverages for 24 hours.   ACTIVITY: Your care partner should take you home directly after the procedure. You should plan to take it easy, moving slowly for the rest of the day. You can resume normal activity the day after the procedure however YOU SHOULD NOT DRIVE, use power tools, machinery or perform tasks that involve climbing or major physical exertion for 24 hours (because of the sedation medicines used during the test).   SYMPTOMS TO REPORT IMMEDIATELY: A gastroenterologist can be reached at any hour. Please call [phone]  for any of the following symptoms:  Following lower  endoscopy (colonoscopy, flexible sigmoidoscopy) Excessive amounts of blood in the stool  Significant tenderness, worsening of abdominal pains  Swelling of the abdomen that is new, acute  Fever of 100 or higher    FOLLOW UP:  If any biopsies were taken you will be contacted by phone or by letter within the next 1-3 weeks. Call [phone]  if you have not heard about the biopsies in 3 weeks.  Please also call with any specific questions about appointments or follow up tests.

## 2023-04-25 NOTE — Op Note (Signed)
[facility] Patient Name: Daniel Love Procedure Date: 04/25/2023 MRN: [id number] Attending MD: Iva Boop , MD, [id number] Date of Birth: [birth year] CSN: [id number] Age: 83 Admit Type: Outpatient Procedure:                Flexible Sigmoidoscopy Indications:              Angiodysplasia (of intestine), For therapy of                            angiodysplasia (of intestine) Providers:                Iva Boop, MD, Norman Clay, RN, Rozetta Nunnery, Technician Referring MD:              Medicines:                Monitored Anesthesia Care Complications:            No immediate complications. Estimated Blood Loss:     Estimated blood loss: none. Procedure:                Pre-Anesthesia Assessment:                           - Prior to the procedure, a History and Physical                            was performed, and patient medications and                            allergies were reviewed. The patient's tolerance of                            previous anesthesia was also reviewed. The risks                            and benefits of the procedure and the sedation                            options and risks were discussed with the patient.                            All questions were answered, and informed consent                            was obtained. Prior Anticoagulants: The patient has                            taken no anticoagulant or antiplatelet agents. ASA                            Grade Assessment: III - A patient with severe  systemic disease. After reviewing the risks and                            benefits, the patient was deemed in satisfactory                            condition to undergo the procedure.                           After obtaining informed consent, the scope was                            passed under direct vision. The [id number]-[id number] ([id number]                            Olympus  endoscope was introduced through the anus                            and advanced to the the rectosigmoid junction. The                            flexible sigmoidoscopy was accomplished without                            difficulty. The patient tolerated the procedure                            well. The quality of the bowel preparation was                            excellent. Scope In: Scope Out: Findings:      The perianal and digital rectal examinations were normal.      Multiple angioectasias were found in the distal rectum. Coagulation for       bleeding prevention using argon plasma was successful. Estimated blood       loss: none.      Internal hemorrhoids were found. Impression:               - Multiple istal rectal angioectasias from                            radiation . Treated with argon plasma coagulation                            (APC).                           - Internal hemorrhoids.                           - No specimens collected. Moderate Sedation:      Not Applicable - Patient had care per Anesthesia. Recommendation:           - The patient will be observed post-procedure,  until all discharge criteria are met.                           - Patient has a contact number available for                            emergencies. The signs and symptoms of potential                            delayed complications were discussed with the                            patient. Return to normal activities tomorrow.                            Written discharge instructions were provided to the                            patient.                           - Resume previous diet.                           - OK to resume warfarin - if rebleeds will                            reassess. ? if had bleeding hemorrhoids also - and                            at colonoscopy there was cecal barotrauma that                            could have obscured  AVM's Procedure Code(s):        --- Professional ---                           [phone], Sigmoidoscopy, flexible; diagnostic,                            including collection of specimen(s) by brushing or                            washing, when performed (separate procedure) Diagnosis Code(s):        --- Professional ---                           U04.54, Angiodysplasia of colon without hemorrhage                           K64.8, Other hemorrhoids CPT copyright 2022 [employer]. All rights reserved. The codes documented in this report are preliminary and upon coder review may  be revised to meet current compliance requirements. Iva Boop, MD 04/25/2023 11:03:15 AM This report has been signed electronically. Number of Addenda: 0

## 2023-04-25 NOTE — H&P (Addendum)
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   Past Medical History:  Diagnosis Date   Anal fissure    hx of    Arthritis    Atrial fibrillation    maintaining normal sinus rhythm   Chronic anticoagulation    coumadin   Dyspepsia    Dysrhythmia    Afib   GERD (gastroesophageal reflux disease)    Glaucoma    History of colon polyps 2012   Colonoscopy-Dr. Elnoria Howard    History of echocardiogram    Echocardiogram 2/19: EF 60-65, normal wall motion, trivial AI, moderate to severe MR, mild LAE, mild RAE, mild to moderate TR, PASP 35   History of kidney stones    History of nuclear stress test    Myoview 2/19: EF 42, diffuse HK no significant ischemia, apical lateral scar   Hypertension    Prostate cancer    Skin cancer    skin cancer removed from left arm and back    Past Surgical History:  Procedure Laterality Date   BACK SURGERY  12/31/1977   for degenerative disc disease   COLONOSCOPY     COLONOSCOPY WITH ESOPHAGOGASTRODUODENOSCOPY (EGD)     IR RADIOLOGIST EVAL & MGMT  01/16/2022   IR RADIOLOGIST EVAL & MGMT  06/04/2022   PROSTATE BIOPSY     RADIOLOGY WITH ANESTHESIA N/A 02/21/2022   Procedure: CT WITH ANESTHESIA CRYOABLATION;  Surgeon: Irish Lack, MD;  Location: [facility];  Service: Radiology;  Laterality: N/A;   TEE WITHOUT CARDIOVERSION N/A 03/04/2019   Procedure: TRANSESOPHAGEAL ECHOCARDIOGRAM (TEE);  Surgeon: Elease Hashimoto Deloris Ping, MD;  Location: [facility] ENDOSCOPY;  Service: Cardiovascular;  Laterality: N/A;   toe removed     Right second toe   UPPER GASTROINTESTINAL ENDOSCOPY      Prior to Admission medications   Medication Sig Start Date End Date Taking?  Authorizing Provider  acetaminophen (TYLENOL) 500 MG tablet Take 1,000 mg by mouth every 6 (six) hours as needed (for pain.).    [provider]  clotrimazole-betamethasone (LOTRISONE) cream Apply topically as needed (rash). 12/03/18   [provider]  colchicine 0.6 MG tablet Take 0.6 mg by mouth daily as needed (gout flare). 07/30/21   [provider]  dapagliflozin propanediol (FARXIGA) 10 MG TABS tablet Take 1 tablet (10 mg total) by mouth daily before breakfast. 12/20/22   Chandrasekhar, [name] A, MD  diphenhydramine-acetaminophen (TYLENOL PM) 25-500 MG TABS tablet Take 1 tablet by mouth at bedtime as needed (sleep).    [provider]  furosemide (LASIX) 40 MG tablet Take 1 tablet (40 mg total) by mouth daily. 07/26/22   Chandrasekhar, Lafayette Dragon A, MD  ibuprofen (ADVIL) 200 MG tablet Take 200-400 mg by mouth every 8 (eight) hours as needed (pain.).    [provider]  isosorbide mononitrate (IMDUR) 30 MG 24 hr tablet Take 15 mg by mouth in the morning. 10/13/18   [provider]  LUMIGAN 0.01 % SOLN Place 1 drop into both eyes daily. Midday 06/13/16   [provider]  Menthol, Topical Analgesic, (ICY HOT BACK EX) Apply 1 application topically daily as needed (pain).    [provider]  Multiple Vitamin (MULTIVITAMIN WITH MINERALS) TABS tablet Take 1 tablet by mouth in the morning. Centrum Silver    [provider]  olmesartan-hydrochlorothiazide (BENICAR HCT) 40-25 MG tablet TAKE ONE TABLET BY MOUTH EVERY NIGHT AT BEDTIME 09/10/22   Nahser, Deloris Ping, MD  oxymetazoline (AFRIN) 0.05 % nasal spray Place 1 spray into both nostrils at bedtime as needed for congestion.    [provider]  timolol (TIMOPTIC) 0.5 % ophthalmic solution 1 drop 2 (two) times daily. 03/07/20   [provider]    No current facility-administered medications for this encounter.    Allergies as of 04/19/2023 - Review Complete  04/15/2023  Allergen Reaction Noted   Amlodipine Hives and Swelling 05/31/2011   Lisinopril Cough 05/31/2011    Family History  Problem Relation Age of Onset   Cancer Mother        uncertain/possibly lung   Heart failure Father    Cancer Father        associated with her lungs   Heart attack Father    Colon cancer Maternal Grandmother    Stroke Neg Hx    Esophageal cancer Neg Hx    Rectal cancer Neg Hx    Stomach cancer Neg Hx    Liver disease Neg Hx     Social History   Socioeconomic History   Marital status: Married    Spouse name: Not on file   Number of children: 3   Years of education: Not on file   Highest education level: Not on file  Occupational History   Occupation: Retired  Tobacco Use   Smoking status: Former    Packs/day: 1.00    Years: 7.00    Additional pack years: 0.00    Total pack years: 7.00    Types: Cigarettes    Quit date: 12/31/1966    Years since quitting: 56.3   Smokeless tobacco: Never  Vaping Use   Vaping Use: Never used  Substance and Sexual Activity   Alcohol use: Yes    Alcohol/week: 7.0 standard drinks of alcohol    Types: 7 Glasses of wine per week    Comment: 1 weekly   Drug use: No   Sexual activity: Not Currently  Other Topics Concern   Not on file  Social History Narrative   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 They share three daughters, two live local, one in [place]   09-26-18 Unable to ask abuse questions wife and daughter with him today.   Social Determinants of Health   Financial Resource Strain: Not on file  Food Insecurity: Not on file  Transportation Needs: Not on file  Physical Activity: Not on file  Stress: Not on file  Social Connections: Not on file  Intimate Partner Violence: Not on file    Review of Systems:  All other review of systems negative except as mentioned in the HPI.  Physical Exam: Vital signs There were no vitals taken for this visit.  General:    Alert,  Well-developed, well-nourished, pleasant and cooperative in NAD Lungs:  Clear throughout to auscultation.   Heart:  RRRno murmurs, clicks, rubs,  or gallops. Abdomen:  Soft, nontender and nondistended. Normal bowel sounds.   Neuro/Psych:  Alert and cooperative. Normal mood and affect. A and O x 3    Sena Slate, MD, [facility] [phone] (pager) 04/25/2023 10:06 AM@

## 2023-04-25 NOTE — Anesthesia Postprocedure Evaluation (Signed)
Anesthesia Post Note  Patient: Daniel Love  Procedure(s) Performed: FLEXIBLE SIGMOIDOSCOPY HOT HEMOSTASIS (ARGON PLASMA COAGULATION/BICAP)     Patient location during evaluation: Endoscopy Anesthesia Type: MAC Level of consciousness: awake and alert Pain management: pain level controlled Vital Signs Assessment: post-procedure vital signs reviewed and stable Respiratory status: spontaneous breathing, nonlabored ventilation, respiratory function stable and patient connected to nasal cannula oxygen Cardiovascular status: blood pressure returned to baseline and stable Postop Assessment: no apparent nausea or vomiting Anesthetic complications: no  No notable events documented.  Last Vitals:  Vitals:   04/25/23 1103 04/25/23 1110  BP:  104/65  Pulse: 64 (!) 55  Resp: 16 15  Temp:    SpO2: 100% 97%    Last Pain:  Vitals:   04/25/23 1110  TempSrc:   PainSc: 0-No pain                 Trevor Iha

## 2023-04-26 ENCOUNTER — Telehealth: Payer: Self-pay | Admitting: Internal Medicine

## 2023-04-26 NOTE — Telephone Encounter (Signed)
Called pt advised that surgeon that performs procedure normally tells pt when to restart blood thinner.  Pt reports surgeons office was to reach out to our office and give him a call.  Advised pt we have not received a call.  Pt had surgery yesterday.  Will forward to coumadin clinic and MD to advise.

## 2023-04-26 NOTE — Telephone Encounter (Signed)
Typically, when a pt is having a procedure we will tell them to resume there warfarin the night of the procedure and recheck INR 1 week post procedure.   However, per chart pt has been having blood in his stools. Per Dr. Debby Bud note from 04/05/2023:  - due to his active bleeding diathesis clearance algorithm does not apply: he is actively bleeding ~ 1/3 cup a blood per bowel movement - will stop coumadin until cleared to restart per GI, then resume at coumadin clinic

## 2023-04-26 NOTE — Telephone Encounter (Signed)
Pt c/o medication issue:  1. Name of Medication: coumadin  2. How are you currently taking this medication (dosage and times per day)?   3. Are you having a reaction (difficulty breathing--STAT)?   4. What is your medication issue? Pt states his coumadin was held by Dr. Izora Ribas due to his procedures. He states the last procedure was yesterday. He would like to know when he can start back on his medication.

## 2023-04-27 ENCOUNTER — Encounter: Payer: Self-pay | Admitting: Internal Medicine

## 2023-04-29 ENCOUNTER — Encounter (HOSPITAL_COMMUNITY): Payer: Self-pay | Admitting: Internal Medicine

## 2023-04-29 NOTE — Telephone Encounter (Signed)
Called pt to update on resuming warfarin. There was no answer therefore, left a message for him to call back.

## 2023-04-30 NOTE — Telephone Encounter (Signed)
Spoke with pt and he states he restarted his warfarin last Saturday 04/27/23; advised that he will need to have INR rechecked. Made an appt for 05/03/23 at 1145am.

## 2023-05-03 ENCOUNTER — Ambulatory Visit: Payer: Medicare Other | Attending: Cardiovascular Disease

## 2023-05-03 DIAGNOSIS — I4821 Permanent atrial fibrillation: Secondary | ICD-10-CM | POA: Diagnosis not present

## 2023-05-03 DIAGNOSIS — Z7901 Long term (current) use of anticoagulants: Secondary | ICD-10-CM

## 2023-05-03 LAB — POCT INR: INR: 1.4 — AB (ref 2.0–3.0)

## 2023-05-03 NOTE — Patient Instructions (Signed)
Description   Take 1.5 tablets today and 1.5 tablets tomorrow and then continue Warfarin 1 tablet daily.  Recheck INR 1 week.  Call us with any update or changes #[phone].

## 2023-05-08 ENCOUNTER — Other Ambulatory Visit: Payer: Self-pay | Admitting: Interventional Radiology

## 2023-05-08 DIAGNOSIS — C801 Malignant (primary) neoplasm, unspecified: Secondary | ICD-10-CM

## 2023-05-09 DIAGNOSIS — I10 Essential (primary) hypertension: Secondary | ICD-10-CM | POA: Diagnosis not present

## 2023-05-09 DIAGNOSIS — L03011 Cellulitis of right finger: Secondary | ICD-10-CM | POA: Diagnosis not present

## 2023-05-09 DIAGNOSIS — M7989 Other specified soft tissue disorders: Secondary | ICD-10-CM | POA: Diagnosis not present

## 2023-05-09 DIAGNOSIS — M109 Gout, unspecified: Secondary | ICD-10-CM | POA: Diagnosis not present

## 2023-05-13 ENCOUNTER — Ambulatory Visit: Payer: Medicare Other | Attending: Cardiovascular Disease

## 2023-05-13 DIAGNOSIS — I10 Essential (primary) hypertension: Secondary | ICD-10-CM | POA: Diagnosis not present

## 2023-05-13 DIAGNOSIS — I4821 Permanent atrial fibrillation: Secondary | ICD-10-CM | POA: Diagnosis not present

## 2023-05-13 DIAGNOSIS — Z7901 Long term (current) use of anticoagulants: Secondary | ICD-10-CM | POA: Diagnosis not present

## 2023-05-13 DIAGNOSIS — M1A9XX1 Chronic gout, unspecified, with tophus (tophi): Secondary | ICD-10-CM | POA: Diagnosis not present

## 2023-05-13 DIAGNOSIS — M79641 Pain in right hand: Secondary | ICD-10-CM | POA: Diagnosis not present

## 2023-05-13 DIAGNOSIS — M7989 Other specified soft tissue disorders: Secondary | ICD-10-CM | POA: Diagnosis not present

## 2023-05-13 LAB — POCT INR: INR: 2.9 (ref 2.0–3.0)

## 2023-05-13 NOTE — Patient Instructions (Signed)
continue Warfarin 1 tablet daily.  Recheck INR 4 weeks.  Call us with any update or changes #[phone].

## 2023-05-20 ENCOUNTER — Ambulatory Visit: Payer: Medicare Other | Admitting: Gastroenterology

## 2023-05-20 DIAGNOSIS — M1A9XX1 Chronic gout, unspecified, with tophus (tophi): Secondary | ICD-10-CM | POA: Diagnosis not present

## 2023-06-10 ENCOUNTER — Ambulatory Visit
Admission: RE | Admit: 2023-06-10 | Discharge: 2023-06-10 | Disposition: A | Discharge status: Home / Self Care | Payer: Medicare Other | Source: Ambulatory Visit | Attending: Interventional Radiology | Admitting: Interventional Radiology

## 2023-06-10 DIAGNOSIS — I5032 Chronic diastolic (congestive) heart failure: Secondary | ICD-10-CM | POA: Diagnosis not present

## 2023-06-10 DIAGNOSIS — C801 Malignant (primary) neoplasm, unspecified: Secondary | ICD-10-CM | POA: Diagnosis not present

## 2023-06-10 DIAGNOSIS — I351 Nonrheumatic aortic (valve) insufficiency: Secondary | ICD-10-CM | POA: Diagnosis not present

## 2023-06-10 DIAGNOSIS — I700 Atherosclerosis of aorta: Secondary | ICD-10-CM | POA: Diagnosis not present

## 2023-06-10 DIAGNOSIS — I712 Thoracic aortic aneurysm, without rupture, unspecified: Secondary | ICD-10-CM | POA: Diagnosis not present

## 2023-06-10 DIAGNOSIS — I517 Cardiomegaly: Secondary | ICD-10-CM | POA: Diagnosis not present

## 2023-06-10 DIAGNOSIS — K627 Radiation proctitis: Secondary | ICD-10-CM | POA: Diagnosis not present

## 2023-06-10 DIAGNOSIS — Z8546 Personal history of malignant neoplasm of prostate: Secondary | ICD-10-CM | POA: Diagnosis not present

## 2023-06-10 MED ORDER — IOPAMIDOL (ISOVUE-300) INJECTION 61%
60.0000 mL | Freq: Once | INTRAVENOUS | Status: AC | PRN
  Administered 2023-06-10: 60 mL via INTRAVENOUS

## 2023-06-12 ENCOUNTER — Ambulatory Visit: Payer: Medicare Other | Attending: Cardiovascular Disease | Admitting: *Deleted

## 2023-06-12 DIAGNOSIS — Z7901 Long term (current) use of anticoagulants: Secondary | ICD-10-CM | POA: Diagnosis not present

## 2023-06-12 DIAGNOSIS — I4821 Permanent atrial fibrillation: Secondary | ICD-10-CM | POA: Insufficient documentation

## 2023-06-12 LAB — POCT INR: INR: 3.7 — AB (ref 2.0–3.0)

## 2023-06-12 NOTE — Patient Instructions (Signed)
Description   Do not take any warfarin tomorrow then continue Warfarin 1 tablet daily.  Recheck INR 4 weeks.  Call us with any update or changes #[phone].

## 2023-06-14 ENCOUNTER — Ambulatory Visit
Admission: RE | Admit: 2023-06-14 | Discharge: 2023-06-14 | Disposition: A | Discharge status: Home / Self Care | Payer: Medicare Other | Source: Ambulatory Visit | Attending: Interventional Radiology | Admitting: Interventional Radiology

## 2023-06-14 DIAGNOSIS — C801 Malignant (primary) neoplasm, unspecified: Secondary | ICD-10-CM

## 2023-06-17 ENCOUNTER — Other Ambulatory Visit: Payer: Self-pay | Admitting: Interventional Radiology

## 2023-06-17 DIAGNOSIS — C801 Malignant (primary) neoplasm, unspecified: Secondary | ICD-10-CM

## 2023-07-10 ENCOUNTER — Ambulatory Visit: Payer: Medicare Other | Attending: Cardiology

## 2023-07-10 DIAGNOSIS — Z7901 Long term (current) use of anticoagulants: Secondary | ICD-10-CM | POA: Diagnosis not present

## 2023-07-10 DIAGNOSIS — I4821 Permanent atrial fibrillation: Secondary | ICD-10-CM | POA: Insufficient documentation

## 2023-07-10 LAB — POCT INR: INR: 2.8 (ref 2.0–3.0)

## 2023-07-10 NOTE — Patient Instructions (Signed)
Description   Continue Warfarin 1 tablet daily.  Recheck INR 5 weeks.  Call us with any update or changes #[phone].

## 2023-07-11 DIAGNOSIS — I10 Essential (primary) hypertension: Secondary | ICD-10-CM | POA: Diagnosis not present

## 2023-07-11 DIAGNOSIS — K625 Hemorrhage of anus and rectum: Secondary | ICD-10-CM | POA: Diagnosis not present

## 2023-07-11 DIAGNOSIS — I4821 Permanent atrial fibrillation: Secondary | ICD-10-CM | POA: Diagnosis not present

## 2023-07-11 DIAGNOSIS — Z7901 Long term (current) use of anticoagulants: Secondary | ICD-10-CM | POA: Diagnosis not present

## 2023-07-11 DIAGNOSIS — R1084 Generalized abdominal pain: Secondary | ICD-10-CM | POA: Diagnosis not present

## 2023-07-11 DIAGNOSIS — K921 Melena: Secondary | ICD-10-CM | POA: Diagnosis not present

## 2023-07-11 DIAGNOSIS — R5383 Other fatigue: Secondary | ICD-10-CM | POA: Diagnosis not present

## 2023-07-12 ENCOUNTER — Ambulatory Visit
Admission: RE | Admit: 2023-07-12 | Discharge: 2023-07-12 | Disposition: A | Discharge status: Home / Self Care | Payer: Medicare Other | Source: Ambulatory Visit | Attending: Interventional Radiology | Admitting: Interventional Radiology

## 2023-07-12 DIAGNOSIS — K2970 Gastritis, unspecified, without bleeding: Secondary | ICD-10-CM | POA: Diagnosis not present

## 2023-07-12 DIAGNOSIS — K621 Rectal polyp: Secondary | ICD-10-CM | POA: Diagnosis not present

## 2023-07-12 DIAGNOSIS — K920 Hematemesis: Secondary | ICD-10-CM | POA: Diagnosis not present

## 2023-07-12 DIAGNOSIS — K3189 Other diseases of stomach and duodenum: Secondary | ICD-10-CM | POA: Diagnosis not present

## 2023-07-12 DIAGNOSIS — Z79899 Other long term (current) drug therapy: Secondary | ICD-10-CM | POA: Diagnosis not present

## 2023-07-12 DIAGNOSIS — K5730 Diverticulosis of large intestine without perforation or abscess without bleeding: Secondary | ICD-10-CM | POA: Diagnosis not present

## 2023-07-12 DIAGNOSIS — Z888 Allergy status to other drugs, medicaments and biological substances status: Secondary | ICD-10-CM | POA: Diagnosis not present

## 2023-07-12 DIAGNOSIS — D123 Benign neoplasm of transverse colon: Secondary | ICD-10-CM | POA: Diagnosis not present

## 2023-07-12 DIAGNOSIS — I362 Nonrheumatic tricuspid (valve) stenosis with insufficiency: Secondary | ICD-10-CM | POA: Diagnosis not present

## 2023-07-12 DIAGNOSIS — K627 Radiation proctitis: Secondary | ICD-10-CM | POA: Diagnosis not present

## 2023-07-12 DIAGNOSIS — I10 Essential (primary) hypertension: Secondary | ICD-10-CM | POA: Diagnosis not present

## 2023-07-12 DIAGNOSIS — I361 Nonrheumatic tricuspid (valve) insufficiency: Secondary | ICD-10-CM | POA: Diagnosis not present

## 2023-07-12 DIAGNOSIS — I340 Nonrheumatic mitral (valve) insufficiency: Secondary | ICD-10-CM | POA: Diagnosis not present

## 2023-07-12 DIAGNOSIS — K2951 Unspecified chronic gastritis with bleeding: Secondary | ICD-10-CM | POA: Diagnosis not present

## 2023-07-12 DIAGNOSIS — Z923 Personal history of irradiation: Secondary | ICD-10-CM | POA: Diagnosis not present

## 2023-07-12 DIAGNOSIS — K7689 Other specified diseases of liver: Secondary | ICD-10-CM | POA: Diagnosis not present

## 2023-07-12 DIAGNOSIS — C801 Malignant (primary) neoplasm, unspecified: Secondary | ICD-10-CM

## 2023-07-12 DIAGNOSIS — Z8546 Personal history of malignant neoplasm of prostate: Secondary | ICD-10-CM | POA: Diagnosis not present

## 2023-07-12 DIAGNOSIS — C61 Malignant neoplasm of prostate: Secondary | ICD-10-CM | POA: Diagnosis not present

## 2023-07-12 DIAGNOSIS — K2950 Unspecified chronic gastritis without bleeding: Secondary | ICD-10-CM | POA: Diagnosis not present

## 2023-07-12 DIAGNOSIS — I5032 Chronic diastolic (congestive) heart failure: Secondary | ICD-10-CM | POA: Diagnosis not present

## 2023-07-12 DIAGNOSIS — D62 Acute posthemorrhagic anemia: Secondary | ICD-10-CM | POA: Diagnosis not present

## 2023-07-12 DIAGNOSIS — K6389 Other specified diseases of intestine: Secondary | ICD-10-CM | POA: Diagnosis not present

## 2023-07-12 DIAGNOSIS — I080 Rheumatic disorders of both mitral and aortic valves: Secondary | ICD-10-CM | POA: Diagnosis not present

## 2023-07-12 DIAGNOSIS — I4821 Permanent atrial fibrillation: Secondary | ICD-10-CM | POA: Diagnosis not present

## 2023-07-12 DIAGNOSIS — I110 Hypertensive heart disease with heart failure: Secondary | ICD-10-CM | POA: Diagnosis not present

## 2023-07-12 DIAGNOSIS — K635 Polyp of colon: Secondary | ICD-10-CM | POA: Diagnosis not present

## 2023-07-12 DIAGNOSIS — K648 Other hemorrhoids: Secondary | ICD-10-CM | POA: Diagnosis not present

## 2023-07-12 DIAGNOSIS — K625 Hemorrhage of anus and rectum: Secondary | ICD-10-CM | POA: Diagnosis not present

## 2023-07-12 DIAGNOSIS — K5731 Diverticulosis of large intestine without perforation or abscess with bleeding: Secondary | ICD-10-CM | POA: Diagnosis not present

## 2023-07-12 DIAGNOSIS — Z85828 Personal history of other malignant neoplasm of skin: Secondary | ICD-10-CM | POA: Diagnosis not present

## 2023-07-12 DIAGNOSIS — K921 Melena: Secondary | ICD-10-CM | POA: Diagnosis not present

## 2023-07-12 DIAGNOSIS — D649 Anemia, unspecified: Secondary | ICD-10-CM | POA: Diagnosis not present

## 2023-07-12 DIAGNOSIS — M109 Gout, unspecified: Secondary | ICD-10-CM | POA: Diagnosis not present

## 2023-07-12 DIAGNOSIS — I4891 Unspecified atrial fibrillation: Secondary | ICD-10-CM | POA: Diagnosis not present

## 2023-07-12 DIAGNOSIS — N2889 Other specified disorders of kidney and ureter: Secondary | ICD-10-CM | POA: Diagnosis not present

## 2023-07-12 DIAGNOSIS — K922 Gastrointestinal hemorrhage, unspecified: Secondary | ICD-10-CM | POA: Diagnosis not present

## 2023-07-12 DIAGNOSIS — Z87891 Personal history of nicotine dependence: Secondary | ICD-10-CM | POA: Diagnosis not present

## 2023-07-12 DIAGNOSIS — K5790 Diverticulosis of intestine, part unspecified, without perforation or abscess without bleeding: Secondary | ICD-10-CM | POA: Diagnosis not present

## 2023-07-12 DIAGNOSIS — I7090 Unspecified atherosclerosis: Secondary | ICD-10-CM | POA: Diagnosis not present

## 2023-07-12 DIAGNOSIS — K644 Residual hemorrhoidal skin tags: Secondary | ICD-10-CM | POA: Diagnosis not present

## 2023-07-12 DIAGNOSIS — Z7901 Long term (current) use of anticoagulants: Secondary | ICD-10-CM | POA: Diagnosis not present

## 2023-07-12 DIAGNOSIS — N179 Acute kidney failure, unspecified: Secondary | ICD-10-CM | POA: Diagnosis not present

## 2023-07-16 DIAGNOSIS — K3189 Other diseases of stomach and duodenum: Secondary | ICD-10-CM | POA: Diagnosis not present

## 2023-07-16 DIAGNOSIS — K6389 Other specified diseases of intestine: Secondary | ICD-10-CM | POA: Diagnosis not present

## 2023-07-16 DIAGNOSIS — K2970 Gastritis, unspecified, without bleeding: Secondary | ICD-10-CM | POA: Diagnosis not present

## 2023-07-16 DIAGNOSIS — K635 Polyp of colon: Secondary | ICD-10-CM | POA: Diagnosis not present

## 2023-07-16 DIAGNOSIS — K921 Melena: Secondary | ICD-10-CM | POA: Diagnosis not present

## 2023-07-16 DIAGNOSIS — K920 Hematemesis: Secondary | ICD-10-CM | POA: Diagnosis not present

## 2023-07-16 DIAGNOSIS — K621 Rectal polyp: Secondary | ICD-10-CM | POA: Diagnosis not present

## 2023-07-17 DIAGNOSIS — D123 Benign neoplasm of transverse colon: Secondary | ICD-10-CM | POA: Diagnosis not present

## 2023-07-17 DIAGNOSIS — K2950 Unspecified chronic gastritis without bleeding: Secondary | ICD-10-CM | POA: Diagnosis not present

## 2023-07-17 DIAGNOSIS — K621 Rectal polyp: Secondary | ICD-10-CM | POA: Diagnosis not present

## 2023-07-21 NOTE — Progress Notes (Unsigned)
Cardiology Office Note:  .    Date:  07/22/2023  ID:  Daniel Love, DOB 07/17/1940, MRN [id number] PCP: Eartha Inch, MD  [facility] Providers Cardiologist:  Christell Constant, MD     CC: Follow up valve disease  History of Present Illness: .    Daniel Love is a 84 y.o. male with AF and frequent GI bleeds.  Patient notes that he is hading recent GI bleed. Notes around 4th of July that he had felt more tired and fatigued. Found to have Hgb drop of 4 to 6.  Found severe bleeding internal hemorrhoids  ED eval 07/12/23 for GI bleed; told that surgery would be complicated because of scar tissue from prostate cancer. In prior visits we had discussed that if he had further bleeding, we would consider Watchman FLX   No chest pain or pressure .  No SOB/DOE and no PND/Orthopnea.  No weight gain or leg swelling.  No palpitations or syncope (he is in permanent AF).   Relevant histories: .  2022: moderate MR, Aortic dilation seen by Dr. Laneta Simmers 2023: pre-op for renal mass cryoablation. Still had moderate to severe MR 2024: Had Stable echo: mod to severe AI , Mod severe TR, and Mod severe MR   Social comes with daughter since 2023, married with wife who now has dementia ROS: As per HPI.   Studies Reviewed: .   Cardiac Studies & Procedures     STRESS TESTS  MYOCARDIAL PERFUSION IMAGING 02/17/2018  Narrative  Nuclear stress EF: 42%.  There was no ST segment deviation noted during stress.  This is an intermediate risk study.  The left ventricular ejection fraction is moderately decreased (30-44%).  1. EF 42%, diffuse hypokinesis. 2. Fixed small, mild apical lateral perfusion defect.  Possible prior infarction, no evidence for significant ischemia.  Intermediate risk study primarily due to low EF.   ECHOCARDIOGRAM  ECHOCARDIOGRAM COMPLETE 04/03/2023  Narrative ECHOCARDIOGRAM REPORT    Patient Name:   Daniel Love Date of Exam: 04/03/2023 Medical  Rec #:  [id number]        Height:       71.0 in Accession #:    [id number]       Weight:       187.6 lb Date of Birth:  [birth year]       BSA:          2.052 m Patient Age:    82 years         BP:           110/70 mmHg Patient Gender: M                HR:           81 bpm. Exam Location:  [address]  Procedure: 2D Echo, 3D Echo, Cardiac Doppler, Color Doppler and Strain Analysis  Indications:     I50.9 CHF  History:         Patient has prior history of Echocardiogram examinations, most recent 11/13/2022. CHF, Abnormal ECG, Arrythmias:Atrial Fibrillation; Risk Factors:Family History of Coronary Artery Disease, Hypertension and Former Smoker. Aortic Root and Ascending Aneurysm.  Sonographer:     Farrel Conners RDCS Referring Phys:  Evern Bio WEAVER Diagnosing Phys: Weston Brass MD  IMPRESSIONS   1. Left ventricular ejection fraction, by estimation, is 55 to 60%. Left ventricular ejection fraction by 3D volume is 61 %. The left ventricle has normal function. The left ventricle has no regional wall  motion abnormalities. There is mild left ventricular hypertrophy. Left ventricular diastolic parameters are indeterminate. 2. Right ventricular systolic function is mildly reduced. The right ventricular size is moderately enlarged. There is mildly elevated pulmonary artery systolic pressure. The estimated right ventricular systolic pressure is 44.2 mmHg. 3. Left atrial size was severely dilated. 4. Right atrial size was severely dilated. 5. Posterior mitral leaflet prolapse with probable small flail segment. Moderate-severe MR with splay artifact in short axis suggesting possible underestimation. MR appeared severe on study from 11/13/22. The mitral valve is abnormal. Moderate to severe mitral valve regurgitation. No evidence of mitral stenosis. 6. Tricuspid valve regurgitation is severe. 7. The aortic valve is tricuspid. There is mild thickening of the aortic valve. Aortic valve  regurgitation is moderate with a possible eccentric component from apical long axis view. Descending aorta diastolic reversal TVI 9 cm. No aortic stenosis is present. 8. Aortic dilatation noted. Aneurysm of the ascending aorta, measuring 45 mm. There is borderline dilatation of the aortic root, measuring 42 mm. There is moderate dilatation of the ascending aorta. 9. The inferior vena cava is dilated in size with <50% respiratory variability, suggesting right atrial pressure of 15 mmHg.  Comparison(s): Consider TEE to further evaluate mitral valve if clinically indicated.  FINDINGS Left Ventricle: Left ventricular ejection fraction, by estimation, is 55 to 60%. Left ventricular ejection fraction by 3D volume is 61 %. The left ventricle has normal function. The left ventricle has no regional wall motion abnormalities. Global longitudinal strain performed but not reported based on interpreter judgement due to suboptimal tracking. The left ventricular internal cavity size was normal in size. There is mild left ventricular hypertrophy. Left ventricular diastolic function could not be evaluated due to mitral regurgitation (moderate or greater). Left ventricular diastolic parameters are indeterminate.  Right Ventricle: The right ventricular size is moderately enlarged. No increase in right ventricular wall thickness. Right ventricular systolic function is mildly reduced. There is mildly elevated pulmonary artery systolic pressure. The tricuspid regurgitant velocity is 2.70 m/s, and with an assumed right atrial pressure of 15 mmHg, the estimated right ventricular systolic pressure is 44.2 mmHg.  Left Atrium: Left atrial size was severely dilated.  Right Atrium: Right atrial size was severely dilated.  Pericardium: There is no evidence of pericardial effusion.  Mitral Valve: Posterior mitral leaflet prolapse with probable small flail segment. Moderate-severe MR with splay artifact in short axis  suggesting possible underestimation. MR appeared severe on study from 11/13/22. The mitral valve is abnormal. Moderate to severe mitral valve regurgitation. No evidence of mitral valve stenosis.  Tricuspid Valve: The tricuspid valve is normal in structure. Tricuspid valve regurgitation is severe. No evidence of tricuspid stenosis.  Aortic Valve: The aortic valve is tricuspid. There is mild thickening of the aortic valve. Aortic valve regurgitation is moderate. Aortic regurgitation PHT measures 545 msec. No aortic stenosis is present.  Pulmonic Valve: The pulmonic valve was normal in structure. Pulmonic valve regurgitation is trivial. No evidence of pulmonic stenosis.  Aorta: Aortic dilatation noted. There is borderline dilatation of the aortic root, measuring 42 mm. There is moderate dilatation of the ascending aorta. There is an aneurysm involving the ascending aorta measuring 45 mm.  Venous: The inferior vena cava is dilated in size with less than 50% respiratory variability, suggesting right atrial pressure of 15 mmHg.  IAS/Shunts: No atrial level shunt detected by color flow Doppler.   LEFT VENTRICLE PLAX 2D LVIDd:         4.50 cm  Diastology LVIDs:         3.00 cm         LV e' medial:    6.09 cm/s LV PW:         1.10 cm         LV E/e' medial:  11.9 LV IVS:        1.10 cm         LV e' lateral:   13.10 cm/s LVOT diam:     2.40 cm         LV E/e' lateral: 5.5 LV SV:         55 LV SV Index:   27              2D LVOT Area:     4.52 cm        Longitudinal Strain 2D Strain GLS  -15.4 % (A2C): 2D Strain GLS  -14.8 % (A3C): 2D Strain GLS  -19.9 % (A4C): 2D Strain GLS  -16.7 % Avg:  3D Volume EF LV 3D EF:    Left ventricul ar ejection fraction by 3D volume is 61 %.  3D Volume EF: 3D EF:        61 % LV EDV:       133 ml LV ESV:       52 ml LV SV:        81 ml  RIGHT VENTRICLE RV Basal diam:  4.70 cm RV Mid diam:    3.90 cm RV S prime:     10.60 cm/s TAPSE  (M-mode): 1.5 cm RVSP:           32.2 mmHg  LEFT ATRIUM              Index        RIGHT ATRIUM           Index LA diam:        5.30 cm  2.58 cm/m   RA Pressure: 3.00 mmHg LA Vol (A2C):   127.0 ml 61.89 ml/m  RA Area:     28.30 cm LA Vol (A4C):   118.0 ml 57.51 ml/m  RA Volume:   98.20 ml  47.86 ml/m LA Biplane Vol: 125.0 ml 60.92 ml/m AORTIC VALVE LVOT Vmax:   72.75 cm/s LVOT Vmean:  44.550 cm/s LVOT VTI:    0.121 m AI PHT:      545 msec  AORTA Ao Root diam: 4.20 cm Ao Asc diam:  4.50 cm  MITRAL VALVE               TRICUSPID VALVE MV Area (PHT): cm         TR Peak grad:   29.2 mmHg MV Decel Time: 317 msec    TR Vmax:        270.00 cm/s MR Peak grad: 92.7 mmHg    Estimated RAP:  3.00 mmHg MR Mean grad: 60.5 mmHg    RVSP:           32.2 mmHg MR Vmax:      481.50 cm/s MR Vmean:     367.5 cm/s   SHUNTS MV E velocity: 72.30 cm/s  Systemic VTI:  0.12 m MV A velocity: 33.30 cm/s  Systemic Diam: 2.40 cm MV E/A ratio:  2.17  Weston Brass MD Electronically signed by Weston Brass MD Signature Date/Time: 04/03/2023/4:59:47 PM    Final (Updated)   TEE  ECHO TEE 03/04/2019  Narrative TRANSESOPHOGEAL ECHO REPORT  Patient Name:   Daniel Love Date of Exam: 03/04/2019 Medical Rec #:  [id number]      Height:       71.0 in Accession #:    [id number]     Weight:       180.0 lb Date of Birth:  13-Jun-1940     BSA:          2.02 m Patient Age:    78 years       BP:           159/99 mmHg Patient Gender: M              HR:           67 bpm. Exam Location:  Outpatient   Procedure: Transesophageal Echo, Color Doppler, Cardiac Doppler and 3d  Indications:     mitral regurgitation  History:         Patient has prior history of Echocardiogram examinations, most recent 01/02/2019.  Sonographer:     Delcie Roch Referring Phys:  [id number] Vever[address] Fells SKAINS Diagnosing Phys: Donato Schultz MD    PROCEDURE: Consent was requested emergently by emergency room physicain. The patient's  vital signs; including heart rate, blood pressure, and oxygen saturation; remained stable throughout the procedure. The patient developed no complications during the procedure.  IMPRESSIONS   1. The left ventricle has mild-moderately reduced systolic function, with an ejection fraction of 40-45%. The cavity size was normal. Left ventricular diffuse hypokinesis. 2. The right ventricle has normal systolc function. The cavity was normal. There is no increase in right ventricular wall thickness. 3. Left atrial size was severely dilated. 4. 6.1cm left atrial diameter in 4 chamber view. 5. Right atrial size was severely dilated. 6. The mitral valve is myxomatous. Moderate thickening of the posterior mitral valve leaflet. Mitral valve regurgitation is moderate by color flow Doppler. The MR jet is centrally-directed. 7. MR PISA radius 0.5cm MR ERO 0.12 cm2 MR Volume 23ml.  8. The tricuspid valve was myxomatous. Tricuspid valve regurgitation is moderate-severe. 9. The aortic valve is tricuspid Mild thickening of the aortic valve Mild calcification of the aortic valve. Aortic valve regurgitation is mild by color flow Doppler. The jet is posteriorly-directed. 10. The pulmonic valve was normal in structure. 11. There is dilatation of the ascending aorta measuring 41 mm. 12. Pulmonary hypertension is mild.  FINDINGS Left Ventricle: The left ventricle has mild-moderately reduced systolic function, with an ejection fraction of 40-45%. The cavity size was normal. There is no increase in left ventricular wall thickness. Left ventricular diffuse hypokinesis. Right Ventricle: The right ventricle has normal systolic function. The cavity was normal. There is no increase in right ventricular wall thickness. Left Atrium: Left atrial size was severely dilated. 6.1cm left atrial diameter in 4 chamber view. Right Atrium: Right atrial size was severely dilated. Right atrial pressure is estimated at 8  mmHg. Interatrial Septum: No atrial level shunt detected by color flow Doppler. Saline contrast bubble study was negative, with no evidence of any interatrial shunt. Pericardium: There is no evidence of pericardial effusion. Mitral Valve: The mitral valve is myxomatous. Moderate thickening of the posterior mitral valve leaflet. Mitral valve regurgitation is moderate by color flow Doppler. The MR jet is centrally-directed. Pulmonary venous flow is normal. MR PISA radius 0.5cm MR ERO 0.12 cm2 MR Volume 23ml.  Tricuspid Valve: The tricuspid valve was myxomatous. Tricuspid valve regurgitation is moderate-severe by color flow Doppler. Aortic Valve: The aortic valve is tricuspid Mild thickening of the  aortic valve Mild calcification of the aortic valve. Aortic valve regurgitation is mild by color flow Doppler. The jet is posteriorly-directed. Pulmonic Valve: The pulmonic valve was normal in structure. Pulmonic valve regurgitation is not visualized by color flow Doppler. Aorta: There is dilatation of the ascending aorta measuring 41 mm. Pulmonary Artery: Pulmonary hypertension is mild. Venous: The inferior vena cava is normal in size with greater than 50% respiratory variability.  RIGHT ATRIUM RA Pressure: 8 mmHg MR Peak grad: 117.5 mmHg MR Mean grad: 84.0 mmHg MR Vmax:      542.00 cm/s MR Vmean:     436.0 cm/s MR PISA:      1.57   Donato Schultz MD Electronically signed by Donato Schultz MD Signature Date/Time: 03/04/2019/3:49:35 PM    Final             Physical Exam:    VS:  BP 100/70   Pulse 71   Ht 5\' 11"  (1.803 m)   Wt 189 lb 3.2 oz (85.8 kg)   SpO2 96%   BMI 26.39 kg/m    Wt Readings from Last 3 Encounters:  07/22/23 189 lb 3.2 oz (85.8 kg)  04/25/23 186 lb (84.4 kg)  04/15/23 190 lb (86.2 kg)    Gen: No distress, elderly male   Neck: No JVD Cardiac: No Rubs or Gallops, holosystolic murmur, IRIR, +2 radial pulses Respiratory: Clear to auscultation bilaterally, normal effort,  normal  respiratory rate GI: Soft, nontender, non-distended  MS: +2  edema;  moves all extremities Integument: Skin feels warm Neuro:  At time of evaluation, alert and oriented to person/place/time/situation  Psych: Normal affect, patient feels ok   ASSESSMENT AND PLAN: .    Permanent Atrial Fibrillation Multiple GI bleeds Rare PVCs - Risk factors include Age X2 and HF, HTN - CHADSVASC=4. - AC resumed when able as above - asymptomatic  [facility] Referral for Left Atrial Appendage Closure with Non-Valvular Atrial Fibrillation   Daniel Love is a 83 y.o. male is being referred to the [facility] for evaluation for Left Atrial Appendage Closure with Watchman device for the management of stroke risk resulting form non-valvular atrial fibrillation.    Base upon Daniel Love's history, he is felt to be a poor candidate for long-term anticoagulation because of a history of bleeding (e.g. intracerebral, subdural, GI, retro-peritoneal).  The patient has a HAS-BLED score of 5 indicating a Yearly Major Bleeding Risk of 12.5%.      His CHADS2-VASc Score is 4 with an unadjusted Ischemic Stroke Rate (% per year) of 4.8%.    His stroke risk necessitates a strategy of stroke prevention with either long-term oral anticoagulation or left atrial appendage occlusion therapy. We have discussed their bleeding risk in the context of their comorbid medical problems, as well as the rationale for referral for evaluation of Watchman left atrial appendage occlusion therapy.   We have discussed that he has as   While the patient is at high long-term bleeding risk, they may be appropriate for short-term anticoagulation. Based on this individual patient's stroke and bleeding risk, a shared decision has been made to refer the patient for consideration of Watchman left atrial appendage closure utilizing the [employer] shared decision tool.    Severe  Mitral Regurgitation- Atrial Functional MR with prolapse and potential small flail segment Moderate to severe Tricuspid Regurgitation- atrial functional HFmrEF EF 50-55% - NYHA class I, Stage B, near euvolemic, etiology from AF though no  prior ischemic testing - Diuretic regimen: lasix 40 mg PO increased lasix to 40 mg PO BID - he takes ARB-hydrochlorothiazide and - compression stockings have been recommended - congitnue SGLT2i; if low K and stable Cr would trial MRA    Asymptomatic thoracic aortic aneurysm - Last at 45 mm - follows with Dr. Laneta Simmers - Unclear the he would be a surgical candidate such that he would need six months screening  Post watchman follow up  Time Spent Directly with Patient:   I have spent a total of 40 minutes with the patient reviewing notes, imaging, EKGs, labs and examining the patient as well as establishing an assessment and plan that was discussed personally with the patient.  > 50% of time was spent in direct patient care and family.   Riley Lam, MD FASE [facility] Cardiologist [facility]  [address], [address] [place], [place] [address] [phone]  8:29 AM

## 2023-07-22 ENCOUNTER — Ambulatory Visit: Payer: Medicare Other | Attending: Internal Medicine | Admitting: Internal Medicine

## 2023-07-22 ENCOUNTER — Telehealth: Payer: Self-pay

## 2023-07-22 ENCOUNTER — Other Ambulatory Visit: Payer: Self-pay

## 2023-07-22 ENCOUNTER — Encounter: Payer: Self-pay | Admitting: Internal Medicine

## 2023-07-22 VITALS — BP 100/70 | HR 71 | Ht 71.0 in | Wt 189.2 lb

## 2023-07-22 DIAGNOSIS — D6869 Other thrombophilia: Secondary | ICD-10-CM | POA: Diagnosis not present

## 2023-07-22 DIAGNOSIS — I7121 Aneurysm of the ascending aorta, without rupture: Secondary | ICD-10-CM | POA: Diagnosis not present

## 2023-07-22 DIAGNOSIS — C61 Malignant neoplasm of prostate: Secondary | ICD-10-CM | POA: Diagnosis not present

## 2023-07-22 DIAGNOSIS — I4821 Permanent atrial fibrillation: Secondary | ICD-10-CM | POA: Diagnosis not present

## 2023-07-22 DIAGNOSIS — K627 Radiation proctitis: Secondary | ICD-10-CM | POA: Diagnosis not present

## 2023-07-22 DIAGNOSIS — I340 Nonrheumatic mitral (valve) insufficiency: Secondary | ICD-10-CM | POA: Diagnosis not present

## 2023-07-22 DIAGNOSIS — K921 Melena: Secondary | ICD-10-CM | POA: Diagnosis not present

## 2023-07-22 DIAGNOSIS — I361 Nonrheumatic tricuspid (valve) insufficiency: Secondary | ICD-10-CM | POA: Insufficient documentation

## 2023-07-22 DIAGNOSIS — K625 Hemorrhage of anus and rectum: Secondary | ICD-10-CM | POA: Diagnosis not present

## 2023-07-22 DIAGNOSIS — I080 Rheumatic disorders of both mitral and aortic valves: Secondary | ICD-10-CM | POA: Diagnosis not present

## 2023-07-22 DIAGNOSIS — I5032 Chronic diastolic (congestive) heart failure: Secondary | ICD-10-CM | POA: Diagnosis not present

## 2023-07-22 DIAGNOSIS — Z7901 Long term (current) use of anticoagulants: Secondary | ICD-10-CM | POA: Diagnosis not present

## 2023-07-22 DIAGNOSIS — Z09 Encounter for follow-up examination after completed treatment for conditions other than malignant neoplasm: Secondary | ICD-10-CM | POA: Diagnosis not present

## 2023-07-22 MED ORDER — FUROSEMIDE 40 MG PO TABS: 40.0000 mg | ORAL_TABLET | Freq: Two times a day (BID) | ORAL | 3 refills | Status: DC

## 2023-07-22 MED ORDER — WARFARIN SODIUM 5 MG PO TABS: 5.0000 mg | ORAL_TABLET | Freq: Every day | ORAL | 1 refills | Status: DC

## 2023-07-22 NOTE — Telephone Encounter (Signed)
-----   Message from Tonny Bollman sent at 07/22/2023 10:59 AM EDT ----- Sure thing [name] happy to see him ----- Message ----- From: Christell Constant, MD Sent: 07/22/2023   8:45 AM EDT To: Tonny Bollman, MD; Henrietta Dine, RN  Would you be amenable to seeing this young man for Watchman?  He has significant MR and TR but has remained symptomatic largely from anemia.  He could end up with a bi-directional shunt, which I wouldn't close because I think we will eventually need to do at least mTEER.  Overall, the crux of his case is threading the needle.  Thanks for seeing him, MAC

## 2023-07-22 NOTE — Telephone Encounter (Signed)
Spoke with the patient's daughter, Drenda Freeze. Scheduled the patient for Watchman consult with Dr. Excell Seltzer 08/22/2023. She was grateful for call and agreed with plan.

## 2023-07-22 NOTE — Patient Instructions (Signed)
Medication Instructions:  Your physician has recommended you make the following change in your medication:  INCREASE: furosemide (Lasix) to 40 mg by mouth twice daily  *If you need a refill on your cardiac medications before your next appointment, please call your pharmacy*   Lab Work: IN 2 WEEKS: BMP  If you have labs (blood work) drawn today and your tests are completely normal, you will receive your results only by: MyChart Message (if you have MyChart) OR A paper copy in the mail If you have any lab test that is abnormal or we need to change your treatment, we will call you to review the results.   Testing/Procedures: Your physician has referred you to see Dr. Excell Seltzer for a consult.     Follow-Up: At [facility], you and your health needs are our priority.  As part of our continuing mission to provide you with exceptional heart care, we have created designated Provider Care Teams.  These Care Teams include your primary Cardiologist (physician) and Advanced Practice Providers (APPs -  Physician Assistants and Nurse Practitioners) who all work together to provide you with the care you need, when you need it.   Your next appointment:   3-4 month(s)  Provider:   Christell Constant, MD

## 2023-07-24 DIAGNOSIS — L821 Other seborrheic keratosis: Secondary | ICD-10-CM | POA: Diagnosis not present

## 2023-07-24 DIAGNOSIS — D1801 Hemangioma of skin and subcutaneous tissue: Secondary | ICD-10-CM | POA: Diagnosis not present

## 2023-07-24 DIAGNOSIS — Z85828 Personal history of other malignant neoplasm of skin: Secondary | ICD-10-CM | POA: Diagnosis not present

## 2023-07-24 DIAGNOSIS — L812 Freckles: Secondary | ICD-10-CM | POA: Diagnosis not present

## 2023-07-24 DIAGNOSIS — L570 Actinic keratosis: Secondary | ICD-10-CM | POA: Diagnosis not present

## 2023-07-24 DIAGNOSIS — L817 Pigmented purpuric dermatosis: Secondary | ICD-10-CM | POA: Diagnosis not present

## 2023-07-27 ENCOUNTER — Other Ambulatory Visit: Payer: Self-pay | Admitting: Internal Medicine

## 2023-07-31 DIAGNOSIS — I10 Essential (primary) hypertension: Secondary | ICD-10-CM | POA: Diagnosis not present

## 2023-07-31 DIAGNOSIS — K627 Radiation proctitis: Secondary | ICD-10-CM | POA: Diagnosis not present

## 2023-08-05 ENCOUNTER — Ambulatory Visit: Payer: Medicare Other | Attending: Internal Medicine

## 2023-08-05 ENCOUNTER — Ambulatory Visit: Admission: RE | Admit: 2023-08-05 | Payer: Medicare Other | Source: Ambulatory Visit

## 2023-08-05 DIAGNOSIS — I340 Nonrheumatic mitral (valve) insufficiency: Secondary | ICD-10-CM | POA: Diagnosis not present

## 2023-08-05 DIAGNOSIS — I4821 Permanent atrial fibrillation: Secondary | ICD-10-CM | POA: Diagnosis not present

## 2023-08-05 DIAGNOSIS — N2889 Other specified disorders of kidney and ureter: Secondary | ICD-10-CM | POA: Diagnosis not present

## 2023-08-05 DIAGNOSIS — I361 Nonrheumatic tricuspid (valve) insufficiency: Secondary | ICD-10-CM | POA: Diagnosis not present

## 2023-08-05 HISTORY — PX: IR RADIOLOGIST EVAL & MGMT: IMG5224

## 2023-08-05 NOTE — Progress Notes (Signed)
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   History of Present Illness: Daniel Love is a 83 y.o. male status post biopsy and cryoablation of a 2.8 cm right renal mass on 02/21/2022. Biopsy demonstrated an oncocytic neoplasm without malignant cells visualized. He did well after the procedure and currently has no complaints. He denies any pain or urinary symptoms. A follow up CT was performed on 06/10/2023.   He recently had rectal bleeding from angioectasias and radiation proctitis secondary to prior radiation therapy treated by [facility] on 04/25/23 by Dr. Leone Payor and treated again at [facility] 2 weeks ago. He has not had any recurrent rectal bleeding since the most recent procedure.  Past Medical History:  Diagnosis Date   Anal fissure    hx of    Arthritis    Atrial fibrillation (HCC)    maintaining normal sinus rhythm   Chronic anticoagulation    coumadin   Dyspepsia    Dysrhythmia    Afib   GERD (gastroesophageal reflux disease)    Glaucoma    History of colon polyps 2012   Colonoscopy-Dr. Elnoria Howard    History of echocardiogram    Echocardiogram 2/19: EF 60-65, normal wall motion, trivial AI, moderate to severe MR, mild LAE, mild RAE, mild to moderate TR, PASP 35   History of kidney stones    History of nuclear stress test    Myoview 2/19: EF 42, diffuse HK no significant ischemia, apical lateral scar   Hypertension    Prostate cancer (HCC)    Skin cancer    skin cancer removed from left arm and back    Past Surgical History:  Procedure Laterality Date   BACK SURGERY  12/31/1977   for degenerative disc disease   COLONOSCOPY     COLONOSCOPY WITH ESOPHAGOGASTRODUODENOSCOPY (EGD)     FLEXIBLE SIGMOIDOSCOPY N/A 04/25/2023   Procedure: FLEXIBLE SIGMOIDOSCOPY;  Surgeon: Iva Boop, MD;  Location: [facility];  Service: Gastroenterology;  Laterality:  N/A;   HOT HEMOSTASIS N/A 04/25/2023   Procedure: HOT HEMOSTASIS (ARGON PLASMA COAGULATION/BICAP);  Surgeon: Iva Boop, MD;  Location: [facility];  Service: Gastroenterology;  Laterality: N/A;   IR RADIOLOGIST EVAL & MGMT  01/16/2022   IR RADIOLOGIST EVAL & MGMT  06/04/2022   PROSTATE BIOPSY     RADIOLOGY WITH ANESTHESIA N/A 02/21/2022   Procedure: CT WITH ANESTHESIA CRYOABLATION;  Surgeon: Irish Lack, MD;  Location: [facility];  Service: Radiology;  Laterality: N/A;   TEE WITHOUT CARDIOVERSION N/A 03/04/2019   Procedure: TRANSESOPHAGEAL ECHOCARDIOGRAM (TEE);  Surgeon: Elease Hashimoto Deloris Ping, MD;  Location: [facility];  Service: Cardiovascular;  Laterality: N/A;   toe removed     Right second toe   UPPER GASTROINTESTINAL ENDOSCOPY      Allergies: Amlodipine and Lisinopril  Medications: Prior to Admission medications   Medication Sig Start Date End Date Taking? Authorizing Provider  acetaminophen (TYLENOL) 500 MG tablet Take 1,000 mg by mouth every 6 (six) hours as needed (for pain.).    [provider]  clotrimazole-betamethasone (LOTRISONE) cream Apply topically as needed (rash). 12/03/18   [provider]  colchicine 0.6 MG tablet Take 0.6 mg by mouth daily as needed (gout flare). 07/30/21   [provider]  dapagliflozin propanediol (FARXIGA) 10 MG TABS tablet Take 1 tablet (10 mg total) by mouth daily before breakfast. 12/20/22   Christell Constant, MD  diphenhydramine-acetaminophen (TYLENOL PM) 25-500 MG TABS tablet Take 1 tablet by mouth at bedtime as needed (sleep).    [provider]  furosemide (LASIX) 40 MG tablet TAKE 1 TABLET BY MOUTH DAILY 07/29/23   Chandrasekhar, [name] A, MD  ibuprofen (ADVIL) 200 MG tablet Take 200-400 mg by mouth every 8 (eight) hours as needed (pain.).    [provider]  isosorbide mononitrate (IMDUR) 30 MG 24 hr tablet Take 15 mg by mouth in the morning. 10/13/18   [provider]  LUMIGAN  0.01 % SOLN Place 1 drop into both eyes daily. Midday 06/13/16   [provider]  Menthol, Topical Analgesic, (ICY HOT BACK EX) Apply 1 application topically daily as needed (pain).    [provider]  Multiple Vitamin (MULTIVITAMIN WITH MINERALS) TABS tablet Take 1 tablet by mouth in the morning. Centrum Silver    [provider]  olmesartan-hydrochlorothiazide (BENICAR HCT) 40-25 MG tablet TAKE ONE TABLET BY MOUTH EVERY NIGHT AT BEDTIME 09/10/22   Nahser, Deloris Ping, MD  oxymetazoline (AFRIN) 0.05 % nasal spray Place 1 spray into both nostrils at bedtime as needed for congestion.    [provider]  timolol (TIMOPTIC) 0.5 % ophthalmic solution 1 drop 2 (two) times daily. 03/07/20   [provider]  warfarin (COUMADIN) 5 MG tablet Take 1 tablet (5 mg total) by mouth daily at 6 (six) AM. 07/22/23   Christell Constant, MD     Family History  Problem Relation Age of Onset   Cancer Mother        uncertain/possibly lung   Heart failure Father    Cancer Father        associated with her lungs   Heart attack Father    Colon cancer Maternal Grandmother    Stroke Neg Hx    Esophageal cancer Neg Hx    Rectal cancer Neg Hx    Stomach cancer Neg Hx    Liver disease Neg Hx     Social History   Socioeconomic History   Marital status: Married    Spouse name: Not on file   Number of children: 3   Years of education: Not on file   Highest education level: Not on file  Occupational History   Occupation: Retired  Tobacco Use   Smoking status: Former    Current packs/day: 0.00    Average packs/day: 1 pack/day for 7.0 years (7.0 ttl pk-yrs)    Types: Cigarettes    Start date: 01/01/1960    Quit date: 12/31/1966    Years since quitting: 56.6   Smokeless tobacco: Never  Vaping Use   Vaping status: Never Used  Substance and Sexual Activity   Alcohol use: Yes    Alcohol/week: 7.0 standard drinks of alcohol    Types: 7 Glasses of wine per week     Comment: 1 weekly   Drug use: No   Sexual activity: Not Currently  Other Topics Concern   Not on file  Social History Narrative   Patient is the primary care giver for his wife who is disabled. They share three daughters, two live local, one in [place]   09-26-18 Unable to ask abuse questions wife and daughter with him today.   Social Determinants of Health   Financial Resource Strain: Low Risk  (04/01/2023)   Received from [facility], [facility]   Overall Financial Resource Strain (CARDIA)    Difficulty of Paying Living Expenses: Not hard at all  Food Insecurity:  No Food Insecurity (07/12/2023)   [facility]   Hunger Vital Sign    Worried About Running Out of Food in the Last Year: Never true    Ran Out of Food in the Last Year: Never true  Transportation Needs: No Transportation Needs (07/12/2023)   Received from [facility] - Transportation    Lack of Transportation (Medical): No    Lack of Transportation (Non-Medical): No  Physical Activity: Sufficiently Active (11/27/2021)   Received from [facility], [facility]   Exercise Vital Sign    Days of Exercise per Week: 6 days    Minutes of Exercise per Session: 30 min  Stress: No Stress Concern Present (07/16/2023)   Received from [employer] [facility] - Occupational Stress Questionnaire    Feeling of Stress : Not at all  Social Connections: Unknown (05/13/2022)   Received from [facility], [facility]   Social Network    Social Network: Not on file    ECOG Status: 0 - Asymptomatic  Review of Systems  Constitutional: Negative.   Respiratory: Negative.    Cardiovascular: Negative.   Gastrointestinal: Negative.   Genitourinary: Negative.   Musculoskeletal: Negative.   Neurological: Negative.     Review of Systems: A 12 point ROS discussed and pertinent positives are indicated in the HPI above.  All other systems are negative.   Physical  Exam No direct physical exam was performed (except for noted visual exam findings with Video Visits).   Vital Signs: There were no vitals taken for this visit.  Imaging: No results found.  Labs:  CBC: Recent Labs    04/04/23 1003 04/12/23 1429  WBC 7.9 6.8  HGB 11.4* 10.9*  HCT 34.6* 33.3*  PLT 209.0 216.0    COAGS: Recent Labs    05/03/23 1128 05/13/23 1154 06/12/23 1131 07/10/23 1133  INR 1.4* 2.9 3.7* 2.8    BMP: Recent Labs    08/09/22 1423 12/12/22 1104 01/16/23 1121  NA 140 144 143  K 4.0 4.1 4.2  CL 99 103 101  CO2 26 28 25   GLUCOSE 90 91 92  BUN 34* 43* 40*  CALCIUM 9.2 9.4 9.8  CREATININE 1.48* 1.52* 1.52*     Assessment and Plan:  I reviewed findings from the follow up CT on 06/10/23 with Daniel Love. The ablation defect at the upper pole of the right kidney is smaller compared to studies performed at [name] on 03/30/2022 and 09/27/2022. Small, possibly enhancing, 6-7 mm lesion along the posterior lower pole of the left kidney was noted to be possibly more conspicuous, but appears stable in size on my review. This is too small to treat or sample currently and I recommended continued surveillance. I recommended another CT in one year.     Electronically Signed: Reola Calkins 08/05/2023, 2:05 PM    I spent a total of  10 Minutes in remote  clinical consultation, greater than 50% of which was counseling/coordinating care post ablation of a right renal oncocytic neoplasm.    Visit type: Audio only (telephone). Audio (no video) only due to patient's lack of internet/smartphone capability. Alternative for in-person consultation at [facility], [address] [place], [place], [place]. This visit type was conducted due to national recommendations for restrictions regarding the COVID-19 Pandemic (e.g. social distancing).  This format is felt to be most appropriate for this patient at this time.  All issues noted in this document were  discussed and addressed.

## 2023-08-06 ENCOUNTER — Telehealth: Payer: Self-pay

## 2023-08-06 DIAGNOSIS — I5020 Unspecified systolic (congestive) heart failure: Secondary | ICD-10-CM

## 2023-08-06 MED ORDER — OLMESARTAN MEDOXOMIL 40 MG PO TABS: 40.0000 mg | ORAL_TABLET | Freq: Every day | ORAL | 3 refills | Status: DC

## 2023-08-06 NOTE — Telephone Encounter (Signed)
-----   Message from Christell Constant sent at 08/06/2023  2:22 PM EDT ----- He used to take a combination olmesartan and hydrochlorothiazide (40 mg and 25 mg).  Has this been stopped? ----- Message ----- From: Macie Burows, RN Sent: 08/06/2023   2:12 PM EDT To: Christell Constant, MD  Pt not taking hydrochlorothiazide did you mean furosemide? ----- Message ----- From: Christell Constant, MD Sent: 08/06/2023  12:32 PM EDT To: Macie Burows, RN  Results: Creatinine has increased Plan: Stop hydrochlorothiazide, and just continue ARB; repeat in 1-2 weeks  Christell Constant, MD

## 2023-08-06 NOTE — Telephone Encounter (Signed)
The patient and daughter Drenda Freeze have been notified of the result and verbalized understanding.  All questions (if any) were answered. Macie Burows, RN 08/06/2023 3:30 PM   Daughter concerned that MD will need to increase furosemide d/t stopping hydrochlorothiazide.  Advised to monitor pt for swelling and signs of retaining too much fluid.  If noted to contact our office.  Would like to recovery kidney function before increasing med.  Daughter reports pt had an OV with Colorectal Surgeon and a new plan of care was started.  Pt will take Metamucil, increase PO fluid intake to 64 oz daily, enema BID for 2-3 weeks.  When bowels consistency and color are better bleeding should stop.  Daughter advised Dr. Izora Ribas can call her if he has questions.  Fran's number is on pt chart. Pt request cancel OV with Dr. Excell Seltzer for 08/22/23.  Ov cancel per request.  Advised to call in if has further concerns.  Will send to MD.

## 2023-08-07 DIAGNOSIS — H401132 Primary open-angle glaucoma, bilateral, moderate stage: Secondary | ICD-10-CM | POA: Diagnosis not present

## 2023-08-07 NOTE — Telephone Encounter (Signed)
Left a message to call back.

## 2023-08-14 ENCOUNTER — Ambulatory Visit: Payer: Medicare Other

## 2023-08-15 ENCOUNTER — Ambulatory Visit: Payer: Medicare Other | Attending: Cardiology | Admitting: *Deleted

## 2023-08-15 DIAGNOSIS — Z7901 Long term (current) use of anticoagulants: Secondary | ICD-10-CM

## 2023-08-15 DIAGNOSIS — I4821 Permanent atrial fibrillation: Secondary | ICD-10-CM

## 2023-08-15 LAB — POCT INR: INR: 4.7 — AB (ref 2.0–3.0)

## 2023-08-15 NOTE — Telephone Encounter (Signed)
Called pt to review MD response:  I do worry he may need extra diuretic in the future. This is the worst his kidney function has been since I've been him.  Long term, I also worry about the blood thinners; I think the Watchman evaluation would have been reasonable.  If very concerned we can stop the ARB instead and the hydrochlorothiazide and monitor BP.  Either way, my clinical concern is the balance between his heart, his bleeding, and his kidney function.    Pt reports swelling is no worse than before. Denies increased SOB.  Advised pt to call in if notes signs that he is retaining fluid. (Swelling or SOB) Pt expresses understanding.

## 2023-08-15 NOTE — Telephone Encounter (Signed)
Pt is returning call.  

## 2023-08-15 NOTE — Patient Instructions (Signed)
Description   Do not take any warfarin tomorrow and no warfarin Saturday then continue taking Warfarin 1 tablet daily. Recheck INR 2 weeks.  Call us with any update or changes #[phone].

## 2023-08-22 ENCOUNTER — Ambulatory Visit: Payer: Medicare Other | Admitting: Cardiovascular Disease

## 2023-08-28 DIAGNOSIS — K627 Radiation proctitis: Secondary | ICD-10-CM | POA: Diagnosis not present

## 2023-08-28 DIAGNOSIS — I10 Essential (primary) hypertension: Secondary | ICD-10-CM | POA: Diagnosis not present

## 2023-08-29 ENCOUNTER — Ambulatory Visit: Payer: Medicare Other | Attending: Cardiology

## 2023-08-29 DIAGNOSIS — Z7901 Long term (current) use of anticoagulants: Secondary | ICD-10-CM | POA: Diagnosis not present

## 2023-08-29 DIAGNOSIS — I4821 Permanent atrial fibrillation: Secondary | ICD-10-CM

## 2023-08-29 LAB — POCT INR: INR: 3 (ref 2.0–3.0)

## 2023-08-29 NOTE — Patient Instructions (Signed)
Description   Continue taking Warfarin 1 tablet daily.  Recheck INR 3 weeks.  Call us with any update or changes #[phone].

## 2023-09-16 DIAGNOSIS — Z23 Encounter for immunization: Secondary | ICD-10-CM | POA: Diagnosis not present

## 2023-09-16 DIAGNOSIS — Z7901 Long term (current) use of anticoagulants: Secondary | ICD-10-CM | POA: Diagnosis not present

## 2023-09-16 DIAGNOSIS — I5032 Chronic diastolic (congestive) heart failure: Secondary | ICD-10-CM | POA: Diagnosis not present

## 2023-09-16 DIAGNOSIS — I4821 Permanent atrial fibrillation: Secondary | ICD-10-CM | POA: Diagnosis not present

## 2023-09-16 DIAGNOSIS — M7989 Other specified soft tissue disorders: Secondary | ICD-10-CM | POA: Diagnosis not present

## 2023-09-16 DIAGNOSIS — R600 Localized edema: Secondary | ICD-10-CM | POA: Diagnosis not present

## 2023-09-16 DIAGNOSIS — I10 Essential (primary) hypertension: Secondary | ICD-10-CM | POA: Diagnosis not present

## 2023-09-16 DIAGNOSIS — I080 Rheumatic disorders of both mitral and aortic valves: Secondary | ICD-10-CM | POA: Diagnosis not present

## 2023-09-17 ENCOUNTER — Telehealth: Payer: Self-pay | Admitting: Internal Medicine

## 2023-09-17 NOTE — Telephone Encounter (Signed)
Patient's daughter is calling because the patient went to his PCP and based upon the patient's lab results the PCP wanted them to call the cardiologist. Please advise.

## 2023-09-17 NOTE — Telephone Encounter (Signed)
Left voicemail to return call to offiice

## 2023-09-18 ENCOUNTER — Ambulatory Visit: Payer: Medicare Other | Admitting: Internal Medicine

## 2023-09-18 NOTE — Telephone Encounter (Signed)
Left a message to call back.

## 2023-09-19 ENCOUNTER — Ambulatory Visit: Payer: Medicare Other | Attending: Cardiovascular Disease

## 2023-09-19 ENCOUNTER — Ambulatory Visit: Payer: Medicare Other

## 2023-09-19 DIAGNOSIS — Z7901 Long term (current) use of anticoagulants: Secondary | ICD-10-CM | POA: Diagnosis not present

## 2023-09-19 DIAGNOSIS — M7989 Other specified soft tissue disorders: Secondary | ICD-10-CM | POA: Diagnosis not present

## 2023-09-19 DIAGNOSIS — I4821 Permanent atrial fibrillation: Secondary | ICD-10-CM | POA: Insufficient documentation

## 2023-09-19 LAB — POCT INR: INR: 3.1 — AB (ref 2.0–3.0)

## 2023-09-19 NOTE — Patient Instructions (Signed)
Description   Only take 1/2 tablet tomorrow and then continue taking Warfarin 1 tablet daily.  Recheck INR 4 weeks.  Call us with any update or changes #[phone].

## 2023-10-03 DIAGNOSIS — Z8546 Personal history of malignant neoplasm of prostate: Secondary | ICD-10-CM | POA: Diagnosis not present

## 2023-10-10 DIAGNOSIS — Z8546 Personal history of malignant neoplasm of prostate: Secondary | ICD-10-CM | POA: Diagnosis not present

## 2023-10-10 DIAGNOSIS — D3000 Benign neoplasm of unspecified kidney: Secondary | ICD-10-CM | POA: Diagnosis not present

## 2023-10-14 DIAGNOSIS — I10 Essential (primary) hypertension: Secondary | ICD-10-CM | POA: Diagnosis not present

## 2023-10-14 DIAGNOSIS — I5032 Chronic diastolic (congestive) heart failure: Secondary | ICD-10-CM | POA: Diagnosis not present

## 2023-10-14 DIAGNOSIS — R2689 Other abnormalities of gait and mobility: Secondary | ICD-10-CM | POA: Diagnosis not present

## 2023-10-14 DIAGNOSIS — Z0000 Encounter for general adult medical examination without abnormal findings: Secondary | ICD-10-CM | POA: Diagnosis not present

## 2023-10-14 DIAGNOSIS — I080 Rheumatic disorders of both mitral and aortic valves: Secondary | ICD-10-CM | POA: Diagnosis not present

## 2023-10-14 DIAGNOSIS — R29898 Other symptoms and signs involving the musculoskeletal system: Secondary | ICD-10-CM | POA: Diagnosis not present

## 2023-10-14 DIAGNOSIS — R600 Localized edema: Secondary | ICD-10-CM | POA: Diagnosis not present

## 2023-10-14 DIAGNOSIS — C61 Malignant neoplasm of prostate: Secondary | ICD-10-CM | POA: Diagnosis not present

## 2023-10-17 ENCOUNTER — Ambulatory Visit: Payer: Medicare Other | Attending: Cardiovascular Disease | Admitting: *Deleted

## 2023-10-17 DIAGNOSIS — I4821 Permanent atrial fibrillation: Secondary | ICD-10-CM | POA: Insufficient documentation

## 2023-10-17 DIAGNOSIS — Z7901 Long term (current) use of anticoagulants: Secondary | ICD-10-CM | POA: Diagnosis not present

## 2023-10-17 LAB — POCT INR: INR: 2.3 (ref 2.0–3.0)

## 2023-10-17 NOTE — Patient Instructions (Signed)
Description   Continue taking Warfarin 1 tablet daily. Recheck INR 4 weeks.  Call us with any update or changes #[phone] or [phone].

## 2023-10-22 ENCOUNTER — Telehealth: Payer: Self-pay | Admitting: Internal Medicine

## 2023-10-22 DIAGNOSIS — Z79899 Other long term (current) drug therapy: Secondary | ICD-10-CM

## 2023-10-22 NOTE — Telephone Encounter (Signed)
Call to patient and daughter regarding patient's symptoms. Patient and daughter (DPR) Daniel Love state that patient has been having SOB on exertion for the past month. He states when he sits down to rest it passes quickly. He has also been gaining weight over the past month, but they are unable to give measurement as they do not have a scale at home. Patient states he thinks his legs are swollen but he is wearing compression stockings.   Patient last saw Dr. Izora Ribas on 07/22/23. His lasix was increased at that time but due to creatinine increase, it was reduced back to 40 mg daily. Last Cr was on 08/05/23 and it was 2.19. Patient endorses taking all meds as ordered.

## 2023-10-22 NOTE — Telephone Encounter (Signed)
Pt daughter called in stating pt has been having SOB and fatigue and was advised by PCP to sch appt sooner with Dr. Izora Ribas. I placed him on the waitlist. She is concerned and would like to speak with nurse about his symptoms.

## 2023-10-22 NOTE — Addendum Note (Signed)
Addended by: Luellen Pucker on: 10/22/2023 03:05 PM   Modules accepted: Orders

## 2023-10-22 NOTE — Telephone Encounter (Signed)
Spoke to DOD Dr. Tenny Craw, who ordered labs for patient who agrees to come in for BMET, BNP and CBC tomorrow 10/22/23.

## 2023-10-22 NOTE — Telephone Encounter (Signed)
Patient scheduled for DOD appt w/ Dr. Anne Fu on 10/25/23.

## 2023-10-23 ENCOUNTER — Other Ambulatory Visit: Payer: Self-pay

## 2023-10-23 DIAGNOSIS — Z79899 Other long term (current) drug therapy: Secondary | ICD-10-CM

## 2023-10-23 DIAGNOSIS — I080 Rheumatic disorders of both mitral and aortic valves: Secondary | ICD-10-CM | POA: Diagnosis not present

## 2023-10-23 DIAGNOSIS — I5032 Chronic diastolic (congestive) heart failure: Secondary | ICD-10-CM | POA: Diagnosis not present

## 2023-10-24 LAB — CBC WITH DIFFERENTIAL/PLATELET
Basophils Absolute: 0 10*3/uL (ref 0.0–0.2)
Basos: 1 %
EOS (ABSOLUTE): 0.1 10*3/uL (ref 0.0–0.4)
Eos: 1 %
Hematocrit: 27.7 % — ABNORMAL LOW (ref 37.5–51.0)
Hemoglobin: 7.3 g/dL — ABNORMAL LOW (ref 13.0–17.7)
Immature Grans (Abs): 0 10*3/uL (ref 0.0–0.1)
Immature Granulocytes: 0 %
Lymphocytes Absolute: 0.7 10*3/uL (ref 0.7–3.1)
Lymphs: 11 %
MCH: 19.7 pg — ABNORMAL LOW (ref 26.6–33.0)
MCHC: 26.4 g/dL — ABNORMAL LOW (ref 31.5–35.7)
MCV: 75 fL — ABNORMAL LOW (ref 79–97)
Monocytes Absolute: 0.8 10*3/uL (ref 0.1–0.9)
Monocytes: 12 %
Neutrophils Absolute: 5.1 10*3/uL (ref 1.4–7.0)
Neutrophils: 75 %
Platelets: 264 10*3/uL (ref 150–450)
RBC: 3.7 x10E6/uL — ABNORMAL LOW (ref 4.14–5.80)
RDW: 18.2 % — ABNORMAL HIGH (ref 11.6–15.4)
WBC: 6.8 10*3/uL (ref 3.4–10.8)

## 2023-10-24 LAB — BASIC METABOLIC PANEL
BUN/Creatinine Ratio: 28 — ABNORMAL HIGH (ref 10–24)
BUN: 58 mg/dL — ABNORMAL HIGH (ref 8–27)
CO2: 21 mmol/L (ref 20–29)
Calcium: 8.8 mg/dL (ref 8.6–10.2)
Chloride: 103 mmol/L (ref 96–106)
Creatinine, Ser: 2.05 mg/dL — ABNORMAL HIGH (ref 0.76–1.27)
Glucose: 106 mg/dL — ABNORMAL HIGH (ref 70–99)
Potassium: 4.1 mmol/L (ref 3.5–5.2)
Sodium: 140 mmol/L (ref 134–144)
eGFR: 32 mL/min/{1.73_m2} — ABNORMAL LOW (ref >59)

## 2023-10-24 LAB — PRO B NATRIURETIC PEPTIDE: NT-Pro BNP: 2325 pg/mL — ABNORMAL HIGH (ref 0–486)

## 2023-10-25 ENCOUNTER — Ambulatory Visit: Payer: Medicare Other | Attending: Cardiology | Admitting: Cardiology

## 2023-10-25 ENCOUNTER — Encounter: Payer: Self-pay | Admitting: Cardiology

## 2023-10-25 VITALS — BP 120/66 | HR 64 | Ht 71.0 in | Wt 198.0 lb

## 2023-10-25 DIAGNOSIS — I4821 Permanent atrial fibrillation: Secondary | ICD-10-CM | POA: Diagnosis not present

## 2023-10-25 DIAGNOSIS — D631 Anemia in chronic kidney disease: Secondary | ICD-10-CM | POA: Diagnosis not present

## 2023-10-25 DIAGNOSIS — Z87891 Personal history of nicotine dependence: Secondary | ICD-10-CM | POA: Diagnosis not present

## 2023-10-25 DIAGNOSIS — D509 Iron deficiency anemia, unspecified: Secondary | ICD-10-CM | POA: Diagnosis not present

## 2023-10-25 DIAGNOSIS — I129 Hypertensive chronic kidney disease with stage 1 through stage 4 chronic kidney disease, or unspecified chronic kidney disease: Secondary | ICD-10-CM | POA: Diagnosis not present

## 2023-10-25 DIAGNOSIS — Z79899 Other long term (current) drug therapy: Secondary | ICD-10-CM | POA: Diagnosis not present

## 2023-10-25 DIAGNOSIS — N184 Chronic kidney disease, stage 4 (severe): Secondary | ICD-10-CM | POA: Diagnosis not present

## 2023-10-25 NOTE — Progress Notes (Signed)
55 LV SV Index:   27              2D LVOT Area:     4.52 cm        Longitudinal Strain 2D Strain GLS  -15.4 % (A2C): 2D Strain GLS  -14.8 % (A3C): 2D Strain GLS  -19.9 % (A4C): 2D Strain GLS  -16.7 % Avg:  3D Volume EF LV 3D EF:    Left ventricul ar ejection fraction by 3D volume is 61 %.  3D Volume EF: 3D EF:        61 % LV EDV:       133 ml LV ESV:       52 ml LV SV:        81 ml  RIGHT VENTRICLE RV Basal diam:  4.70 cm RV Mid diam:    3.90 cm RV S prime:     10.60 cm/s TAPSE (M-mode): 1.5 cm RVSP:           32.2 mmHg  LEFT ATRIUM              Index        RIGHT ATRIUM           Index LA diam:        5.30 cm  2.58 cm/m   RA Pressure: 3.00 mmHg LA Vol (A2C):   127.0 ml 61.89 ml/m  RA Area:     28.30 cm LA Vol (A4C):   118.0 ml 57.51 ml/m  RA Volume:   98.20 ml  47.86 ml/m LA Biplane Vol: 125.0 ml 60.92 ml/m AORTIC VALVE LVOT Vmax:   72.75 cm/s LVOT Vmean:  44.550 cm/s LVOT VTI:    0.121 m AI PHT:      545 msec  AORTA Ao Root diam: 4.20 cm Ao Asc diam:  4.50 cm  MITRAL VALVE               TRICUSPID VALVE MV Area (PHT): cm         TR Peak grad:   29.2 mmHg MV Decel Time: 317 msec    TR Vmax:        270.00  cm/s MR Peak grad: 92.7 mmHg    Estimated RAP:  3.00 mmHg MR Mean grad: 60.5 mmHg    RVSP:           32.2 mmHg MR Vmax:      481.50 cm/s MR Vmean:     367.5 cm/s   SHUNTS MV E velocity: 72.30 cm/s  Systemic VTI:  0.12 m MV A velocity: 33.30 cm/s  Systemic Diam: 2.40 cm MV E/A ratio:  2.17  Weston Brass MD Electronically signed by Weston Brass MD Signature Date/Time: 04/03/2023/4:59:47 PM    Final (Updated)   TEE  ECHO TEE 03/04/2019  Narrative TRANSESOPHOGEAL ECHO REPORT    Patient Name:   Daniel Love Date of Exam: 03/04/2019 Medical Rec #:  [id number]      Height:       71.0 in Accession #:    [id number]     Weight:       180.0 lb Date of Birth:  [birth year]     BSA:          2.02 m Patient Age:    78 years       BP:           159/99 mmHg Patient Gender: M  Vascular: No carotid bruits; Distal pulses 2+ bilaterally   Cardiac:  normal S1, S2; IRRR; 2/6 HSM  Lungs:  clear to  auscultation bilaterally, no wheezing, rhonchi or rales  Abd: soft, nontender, no hepatomegaly  Ext: mild LE edema (compression hose) Musculoskeletal:  No deformities, BUE and BLE strength normal and equal Skin: warm and dry  Neuro:  CNs 2-12 intact, no focal abnormalities noted Psych:  Normal affect    EKG: EKG today October 25, 2023 shows atrial fibrillation heart rate 69 bpm no ST segment changes left axis deviation right bundle branch block which is new.  Prior EKG from April 05, 2023 shows atrial fibrillation with rare PVCs heart rate of 60 bpm no ST segment changes  Relevant CV Studies:  Cardiac Studies & Procedures     STRESS TESTS  MYOCARDIAL PERFUSION IMAGING 02/17/2018  Narrative  Nuclear stress EF: 42%.  There was no ST segment deviation noted during stress.  This is an intermediate risk study.  The left ventricular ejection fraction is moderately decreased (30-44%).  1. EF 42%, diffuse hypokinesis. 2. Fixed small, mild apical lateral perfusion defect.  Possible prior infarction, no evidence for significant ischemia.  Intermediate risk study primarily due to low EF.   ECHOCARDIOGRAM  ECHOCARDIOGRAM COMPLETE 04/03/2023  Narrative ECHOCARDIOGRAM REPORT    Patient Name:   Daniel Love Date of Exam: 04/03/2023 Medical Rec #:  [id number]        Height:       71.0 in Accession #:    [id number]       Weight:       187.6 lb Date of Birth:  02-16-1940       BSA:          2.052 m Patient Age:    82 years         BP:           110/70 mmHg Patient Gender: M                HR:           81 bpm. Exam Location:  [address]  Procedure: 2D Echo, 3D Echo, Cardiac Doppler, Color Doppler and Strain Analysis  Indications:     I50.9 CHF  History:         Patient has prior history of Echocardiogram examinations, most recent 11/13/2022. CHF, Abnormal ECG, Arrythmias:Atrial Fibrillation; Risk Factors:Family History of Coronary Artery Disease, Hypertension and Former  Smoker. Aortic Root and Ascending Aneurysm.  Sonographer:     Farrel Conners RDCS Referring Phys:  Evern Bio WEAVER Diagnosing Phys: Weston Brass MD  IMPRESSIONS   1. Left ventricular ejection fraction, by estimation, is 55 to 60%. Left ventricular ejection fraction by 3D volume is 61 %. The left ventricle has normal function. The left ventricle has no regional wall motion abnormalities. There is mild left ventricular hypertrophy. Left ventricular diastolic parameters are indeterminate. 2. Right ventricular systolic function is mildly reduced. The right ventricular size is moderately enlarged. There is mildly elevated pulmonary artery systolic pressure. The estimated right ventricular systolic pressure is 44.2 mmHg. 3. Left atrial size was severely dilated. 4. Right atrial size was severely dilated. 5. Posterior mitral leaflet prolapse with probable small flail segment. Moderate-severe MR with splay artifact in short axis suggesting possible underestimation. MR appeared severe on study from 11/13/22. The mitral valve is abnormal. Moderate to severe mitral valve regurgitation. No evidence of mitral stenosis. 6. Tricuspid valve regurgitation is severe. 7. The aortic valve  Vascular: No carotid bruits; Distal pulses 2+ bilaterally   Cardiac:  normal S1, S2; IRRR; 2/6 HSM  Lungs:  clear to  auscultation bilaterally, no wheezing, rhonchi or rales  Abd: soft, nontender, no hepatomegaly  Ext: mild LE edema (compression hose) Musculoskeletal:  No deformities, BUE and BLE strength normal and equal Skin: warm and dry  Neuro:  CNs 2-12 intact, no focal abnormalities noted Psych:  Normal affect    EKG: EKG today October 25, 2023 shows atrial fibrillation heart rate 69 bpm no ST segment changes left axis deviation right bundle branch block which is new.  Prior EKG from April 05, 2023 shows atrial fibrillation with rare PVCs heart rate of 60 bpm no ST segment changes  Relevant CV Studies:  Cardiac Studies & Procedures     STRESS TESTS  MYOCARDIAL PERFUSION IMAGING 02/17/2018  Narrative  Nuclear stress EF: 42%.  There was no ST segment deviation noted during stress.  This is an intermediate risk study.  The left ventricular ejection fraction is moderately decreased (30-44%).  1. EF 42%, diffuse hypokinesis. 2. Fixed small, mild apical lateral perfusion defect.  Possible prior infarction, no evidence for significant ischemia.  Intermediate risk study primarily due to low EF.   ECHOCARDIOGRAM  ECHOCARDIOGRAM COMPLETE 04/03/2023  Narrative ECHOCARDIOGRAM REPORT    Patient Name:   Daniel Love Date of Exam: 04/03/2023 Medical Rec #:  [id number]        Height:       71.0 in Accession #:    [id number]       Weight:       187.6 lb Date of Birth:  02-16-1940       BSA:          2.052 m Patient Age:    82 years         BP:           110/70 mmHg Patient Gender: M                HR:           81 bpm. Exam Location:  [address]  Procedure: 2D Echo, 3D Echo, Cardiac Doppler, Color Doppler and Strain Analysis  Indications:     I50.9 CHF  History:         Patient has prior history of Echocardiogram examinations, most recent 11/13/2022. CHF, Abnormal ECG, Arrythmias:Atrial Fibrillation; Risk Factors:Family History of Coronary Artery Disease, Hypertension and Former  Smoker. Aortic Root and Ascending Aneurysm.  Sonographer:     Farrel Conners RDCS Referring Phys:  Evern Bio WEAVER Diagnosing Phys: Weston Brass MD  IMPRESSIONS   1. Left ventricular ejection fraction, by estimation, is 55 to 60%. Left ventricular ejection fraction by 3D volume is 61 %. The left ventricle has normal function. The left ventricle has no regional wall motion abnormalities. There is mild left ventricular hypertrophy. Left ventricular diastolic parameters are indeterminate. 2. Right ventricular systolic function is mildly reduced. The right ventricular size is moderately enlarged. There is mildly elevated pulmonary artery systolic pressure. The estimated right ventricular systolic pressure is 44.2 mmHg. 3. Left atrial size was severely dilated. 4. Right atrial size was severely dilated. 5. Posterior mitral leaflet prolapse with probable small flail segment. Moderate-severe MR with splay artifact in short axis suggesting possible underestimation. MR appeared severe on study from 11/13/22. The mitral valve is abnormal. Moderate to severe mitral valve regurgitation. No evidence of mitral stenosis. 6. Tricuspid valve regurgitation is severe. 7. The aortic valve  55 LV SV Index:   27              2D LVOT Area:     4.52 cm        Longitudinal Strain 2D Strain GLS  -15.4 % (A2C): 2D Strain GLS  -14.8 % (A3C): 2D Strain GLS  -19.9 % (A4C): 2D Strain GLS  -16.7 % Avg:  3D Volume EF LV 3D EF:    Left ventricul ar ejection fraction by 3D volume is 61 %.  3D Volume EF: 3D EF:        61 % LV EDV:       133 ml LV ESV:       52 ml LV SV:        81 ml  RIGHT VENTRICLE RV Basal diam:  4.70 cm RV Mid diam:    3.90 cm RV S prime:     10.60 cm/s TAPSE (M-mode): 1.5 cm RVSP:           32.2 mmHg  LEFT ATRIUM              Index        RIGHT ATRIUM           Index LA diam:        5.30 cm  2.58 cm/m   RA Pressure: 3.00 mmHg LA Vol (A2C):   127.0 ml 61.89 ml/m  RA Area:     28.30 cm LA Vol (A4C):   118.0 ml 57.51 ml/m  RA Volume:   98.20 ml  47.86 ml/m LA Biplane Vol: 125.0 ml 60.92 ml/m AORTIC VALVE LVOT Vmax:   72.75 cm/s LVOT Vmean:  44.550 cm/s LVOT VTI:    0.121 m AI PHT:      545 msec  AORTA Ao Root diam: 4.20 cm Ao Asc diam:  4.50 cm  MITRAL VALVE               TRICUSPID VALVE MV Area (PHT): cm         TR Peak grad:   29.2 mmHg MV Decel Time: 317 msec    TR Vmax:        270.00  cm/s MR Peak grad: 92.7 mmHg    Estimated RAP:  3.00 mmHg MR Mean grad: 60.5 mmHg    RVSP:           32.2 mmHg MR Vmax:      481.50 cm/s MR Vmean:     367.5 cm/s   SHUNTS MV E velocity: 72.30 cm/s  Systemic VTI:  0.12 m MV A velocity: 33.30 cm/s  Systemic Diam: 2.40 cm MV E/A ratio:  2.17  Weston Brass MD Electronically signed by Weston Brass MD Signature Date/Time: 04/03/2023/4:59:47 PM    Final (Updated)   TEE  ECHO TEE 03/04/2019  Narrative TRANSESOPHOGEAL ECHO REPORT    Patient Name:   Daniel Love Date of Exam: 03/04/2019 Medical Rec #:  [id number]      Height:       71.0 in Accession #:    [id number]     Weight:       180.0 lb Date of Birth:  [birth year]     BSA:          2.02 m Patient Age:    78 years       BP:           159/99 mmHg Patient Gender: M  Vascular: No carotid bruits; Distal pulses 2+ bilaterally   Cardiac:  normal S1, S2; IRRR; 2/6 HSM  Lungs:  clear to  auscultation bilaterally, no wheezing, rhonchi or rales  Abd: soft, nontender, no hepatomegaly  Ext: mild LE edema (compression hose) Musculoskeletal:  No deformities, BUE and BLE strength normal and equal Skin: warm and dry  Neuro:  CNs 2-12 intact, no focal abnormalities noted Psych:  Normal affect    EKG: EKG today October 25, 2023 shows atrial fibrillation heart rate 69 bpm no ST segment changes left axis deviation right bundle branch block which is new.  Prior EKG from April 05, 2023 shows atrial fibrillation with rare PVCs heart rate of 60 bpm no ST segment changes  Relevant CV Studies:  Cardiac Studies & Procedures     STRESS TESTS  MYOCARDIAL PERFUSION IMAGING 02/17/2018  Narrative  Nuclear stress EF: 42%.  There was no ST segment deviation noted during stress.  This is an intermediate risk study.  The left ventricular ejection fraction is moderately decreased (30-44%).  1. EF 42%, diffuse hypokinesis. 2. Fixed small, mild apical lateral perfusion defect.  Possible prior infarction, no evidence for significant ischemia.  Intermediate risk study primarily due to low EF.   ECHOCARDIOGRAM  ECHOCARDIOGRAM COMPLETE 04/03/2023  Narrative ECHOCARDIOGRAM REPORT    Patient Name:   Daniel Love Date of Exam: 04/03/2023 Medical Rec #:  [id number]        Height:       71.0 in Accession #:    [id number]       Weight:       187.6 lb Date of Birth:  02-16-1940       BSA:          2.052 m Patient Age:    82 years         BP:           110/70 mmHg Patient Gender: M                HR:           81 bpm. Exam Location:  [address]  Procedure: 2D Echo, 3D Echo, Cardiac Doppler, Color Doppler and Strain Analysis  Indications:     I50.9 CHF  History:         Patient has prior history of Echocardiogram examinations, most recent 11/13/2022. CHF, Abnormal ECG, Arrythmias:Atrial Fibrillation; Risk Factors:Family History of Coronary Artery Disease, Hypertension and Former  Smoker. Aortic Root and Ascending Aneurysm.  Sonographer:     Farrel Conners RDCS Referring Phys:  Evern Bio WEAVER Diagnosing Phys: Weston Brass MD  IMPRESSIONS   1. Left ventricular ejection fraction, by estimation, is 55 to 60%. Left ventricular ejection fraction by 3D volume is 61 %. The left ventricle has normal function. The left ventricle has no regional wall motion abnormalities. There is mild left ventricular hypertrophy. Left ventricular diastolic parameters are indeterminate. 2. Right ventricular systolic function is mildly reduced. The right ventricular size is moderately enlarged. There is mildly elevated pulmonary artery systolic pressure. The estimated right ventricular systolic pressure is 44.2 mmHg. 3. Left atrial size was severely dilated. 4. Right atrial size was severely dilated. 5. Posterior mitral leaflet prolapse with probable small flail segment. Moderate-severe MR with splay artifact in short axis suggesting possible underestimation. MR appeared severe on study from 11/13/22. The mitral valve is abnormal. Moderate to severe mitral valve regurgitation. No evidence of mitral stenosis. 6. Tricuspid valve regurgitation is severe. 7. The aortic valve  Cardiology office note   Patient ID: SHAWNTE JON MRN: [id number]; DOB: [birth year]   Admission date: (Not on file)  PCP:  Ernest Mallick, PA-C   [facility] Providers Cardiologist:  Christell Constant, MD        Chief Complaint: Shortness of breath, fatigue  Patient Profile:   Daniel Love is a 83 y.o. male with permanent atrial fibrillation who is being seen 10/25/2023 for the evaluation of shortness of breath, fatigue, severe anemia hemoglobin 7.3 .  History of Present Illness:   Mr. Byfield 83 year old male patient whose daughter called stating that he had been short of breath with exertion over the past month.  Quite significant worsening of symptoms.  Difficult to travel more than 20 to 30 feet before stopping and being short of breath.  No chest pain.  No obvious melena or bright red blood per rectum.   He thinks his legs are more swollen than usual but he has been wearing compression stockings back in July 2024 his Lasix was increased but then when his creatinine increased as well it was reduced back to 40 mg a day creatinine has been in the 2.1 range.  He has atrial fibrillation and has had GI bleed.  Back in the emergency department in July 2024 he was found to have a hemoglobin drop of 4-6 point it was discovered that he had severe bleeding internal hemorrhoids, flexible sigmoidoscopy on 04/25/2023 showed rectal angioectasias which were treated with argon laser.-He takes warfarin for anticoagulation followed here in our clinic.  INR 2.3 on October 17, 2023  He also has severe mitral regurgitation was felt to be functional MR with prolapse and potential small flail segment as well as moderate to severe tricuspid regurgitation with most recent ejection fraction of 50 to 55%  He is also been followed for a thoracic aortic aneurysm-44 mm ascending aorta follows with Dr. Laneta Simmers.    Past Medical History:  Diagnosis Date   Anal fissure    hx of     Arthritis    Atrial fibrillation (HCC)    maintaining normal sinus rhythm   Chronic anticoagulation    coumadin   Dyspepsia    Dysrhythmia    Afib   GERD (gastroesophageal reflux disease)    Glaucoma    History of colon polyps 2012   Colonoscopy-Dr. Elnoria Howard    History of echocardiogram    Echocardiogram 2/19: EF 60-65, normal wall motion, trivial AI, moderate to severe MR, mild LAE, mild RAE, mild to moderate TR, PASP 35   History of kidney stones    History of nuclear stress test    Myoview 2/19: EF 42, diffuse HK no significant ischemia, apical lateral scar   Hypertension    Prostate cancer (HCC)    Skin cancer    skin cancer removed from left arm and back    Past Surgical History:  Procedure Laterality Date   BACK SURGERY  12/31/1977   for degenerative disc disease   COLONOSCOPY     COLONOSCOPY WITH ESOPHAGOGASTRODUODENOSCOPY (EGD)     FLEXIBLE SIGMOIDOSCOPY N/A 04/25/2023   Procedure: FLEXIBLE SIGMOIDOSCOPY;  Surgeon: Iva Boop, MD;  Location: [facility];  Service: Gastroenterology;  Laterality: N/A;   HOT HEMOSTASIS N/A 04/25/2023   Procedure: HOT HEMOSTASIS (ARGON PLASMA COAGULATION/BICAP);  Surgeon: Iva Boop, MD;  Location: [facility];  Service: Gastroenterology;  Laterality: N/A;   IR RADIOLOGIST EVAL & MGMT  01/16/2022   IR RADIOLOGIST EVAL & MGMT  Cardiology office note   Patient ID: SHAWNTE JON MRN: [id number]; DOB: [birth year]   Admission date: (Not on file)  PCP:  Ernest Mallick, PA-C   [facility] Providers Cardiologist:  Christell Constant, MD        Chief Complaint: Shortness of breath, fatigue  Patient Profile:   Daniel Love is a 83 y.o. male with permanent atrial fibrillation who is being seen 10/25/2023 for the evaluation of shortness of breath, fatigue, severe anemia hemoglobin 7.3 .  History of Present Illness:   Mr. Byfield 83 year old male patient whose daughter called stating that he had been short of breath with exertion over the past month.  Quite significant worsening of symptoms.  Difficult to travel more than 20 to 30 feet before stopping and being short of breath.  No chest pain.  No obvious melena or bright red blood per rectum.   He thinks his legs are more swollen than usual but he has been wearing compression stockings back in July 2024 his Lasix was increased but then when his creatinine increased as well it was reduced back to 40 mg a day creatinine has been in the 2.1 range.  He has atrial fibrillation and has had GI bleed.  Back in the emergency department in July 2024 he was found to have a hemoglobin drop of 4-6 point it was discovered that he had severe bleeding internal hemorrhoids, flexible sigmoidoscopy on 04/25/2023 showed rectal angioectasias which were treated with argon laser.-He takes warfarin for anticoagulation followed here in our clinic.  INR 2.3 on October 17, 2023  He also has severe mitral regurgitation was felt to be functional MR with prolapse and potential small flail segment as well as moderate to severe tricuspid regurgitation with most recent ejection fraction of 50 to 55%  He is also been followed for a thoracic aortic aneurysm-44 mm ascending aorta follows with Dr. Laneta Simmers.    Past Medical History:  Diagnosis Date   Anal fissure    hx of     Arthritis    Atrial fibrillation (HCC)    maintaining normal sinus rhythm   Chronic anticoagulation    coumadin   Dyspepsia    Dysrhythmia    Afib   GERD (gastroesophageal reflux disease)    Glaucoma    History of colon polyps 2012   Colonoscopy-Dr. Elnoria Howard    History of echocardiogram    Echocardiogram 2/19: EF 60-65, normal wall motion, trivial AI, moderate to severe MR, mild LAE, mild RAE, mild to moderate TR, PASP 35   History of kidney stones    History of nuclear stress test    Myoview 2/19: EF 42, diffuse HK no significant ischemia, apical lateral scar   Hypertension    Prostate cancer (HCC)    Skin cancer    skin cancer removed from left arm and back    Past Surgical History:  Procedure Laterality Date   BACK SURGERY  12/31/1977   for degenerative disc disease   COLONOSCOPY     COLONOSCOPY WITH ESOPHAGOGASTRODUODENOSCOPY (EGD)     FLEXIBLE SIGMOIDOSCOPY N/A 04/25/2023   Procedure: FLEXIBLE SIGMOIDOSCOPY;  Surgeon: Iva Boop, MD;  Location: [facility];  Service: Gastroenterology;  Laterality: N/A;   HOT HEMOSTASIS N/A 04/25/2023   Procedure: HOT HEMOSTASIS (ARGON PLASMA COAGULATION/BICAP);  Surgeon: Iva Boop, MD;  Location: [facility];  Service: Gastroenterology;  Laterality: N/A;   IR RADIOLOGIST EVAL & MGMT  01/16/2022   IR RADIOLOGIST EVAL & MGMT  55 LV SV Index:   27              2D LVOT Area:     4.52 cm        Longitudinal Strain 2D Strain GLS  -15.4 % (A2C): 2D Strain GLS  -14.8 % (A3C): 2D Strain GLS  -19.9 % (A4C): 2D Strain GLS  -16.7 % Avg:  3D Volume EF LV 3D EF:    Left ventricul ar ejection fraction by 3D volume is 61 %.  3D Volume EF: 3D EF:        61 % LV EDV:       133 ml LV ESV:       52 ml LV SV:        81 ml  RIGHT VENTRICLE RV Basal diam:  4.70 cm RV Mid diam:    3.90 cm RV S prime:     10.60 cm/s TAPSE (M-mode): 1.5 cm RVSP:           32.2 mmHg  LEFT ATRIUM              Index        RIGHT ATRIUM           Index LA diam:        5.30 cm  2.58 cm/m   RA Pressure: 3.00 mmHg LA Vol (A2C):   127.0 ml 61.89 ml/m  RA Area:     28.30 cm LA Vol (A4C):   118.0 ml 57.51 ml/m  RA Volume:   98.20 ml  47.86 ml/m LA Biplane Vol: 125.0 ml 60.92 ml/m AORTIC VALVE LVOT Vmax:   72.75 cm/s LVOT Vmean:  44.550 cm/s LVOT VTI:    0.121 m AI PHT:      545 msec  AORTA Ao Root diam: 4.20 cm Ao Asc diam:  4.50 cm  MITRAL VALVE               TRICUSPID VALVE MV Area (PHT): cm         TR Peak grad:   29.2 mmHg MV Decel Time: 317 msec    TR Vmax:        270.00  cm/s MR Peak grad: 92.7 mmHg    Estimated RAP:  3.00 mmHg MR Mean grad: 60.5 mmHg    RVSP:           32.2 mmHg MR Vmax:      481.50 cm/s MR Vmean:     367.5 cm/s   SHUNTS MV E velocity: 72.30 cm/s  Systemic VTI:  0.12 m MV A velocity: 33.30 cm/s  Systemic Diam: 2.40 cm MV E/A ratio:  2.17  Weston Brass MD Electronically signed by Weston Brass MD Signature Date/Time: 04/03/2023/4:59:47 PM    Final (Updated)   TEE  ECHO TEE 03/04/2019  Narrative TRANSESOPHOGEAL ECHO REPORT    Patient Name:   Daniel Love Date of Exam: 03/04/2019 Medical Rec #:  [id number]      Height:       71.0 in Accession #:    [id number]     Weight:       180.0 lb Date of Birth:  [birth year]     BSA:          2.02 m Patient Age:    78 years       BP:           159/99 mmHg Patient Gender: M

## 2023-10-25 NOTE — Patient Instructions (Signed)
Medication Instructions:  The current medical regimen is effective;  continue present plan and medications.  *If you need a refill on your cardiac medications before your next appointment, please call your pharmacy*  It is recommend by Dr Anne Fu for you to report to the Emergency Department for further treatment on low hemoglobin.  Follow-Up: At [facility], you and your health needs are our priority.  As part of our continuing mission to provide you with exceptional heart care, we have created designated Provider Care Teams.  These Care Teams include your primary Cardiologist (physician) and Advanced Practice Providers (APPs -  Physician Assistants and Nurse Practitioners) who all work together to provide you with the care you need, when you need it.  We recommend signing up for the patient portal called "MyChart".  Sign up information is provided on this After Visit Summary.  MyChart is used to connect with patients for Virtual Visits (Telemedicine).  Patients are able to view lab/test results, encounter notes, upcoming appointments, etc.  Non-urgent messages can be sent to your provider as well.   To learn more about what you can do with MyChart, go to [email or web address].    Your next appointment:   Follow up as scheduled.

## 2023-10-28 ENCOUNTER — Telehealth: Payer: Self-pay | Admitting: Internal Medicine

## 2023-10-28 DIAGNOSIS — I5032 Chronic diastolic (congestive) heart failure: Secondary | ICD-10-CM | POA: Diagnosis not present

## 2023-10-28 DIAGNOSIS — R600 Localized edema: Secondary | ICD-10-CM | POA: Diagnosis not present

## 2023-10-28 DIAGNOSIS — N184 Chronic kidney disease, stage 4 (severe): Secondary | ICD-10-CM | POA: Diagnosis not present

## 2023-10-28 DIAGNOSIS — Z09 Encounter for follow-up examination after completed treatment for conditions other than malignant neoplasm: Secondary | ICD-10-CM | POA: Diagnosis not present

## 2023-10-28 DIAGNOSIS — I080 Rheumatic disorders of both mitral and aortic valves: Secondary | ICD-10-CM | POA: Diagnosis not present

## 2023-10-28 DIAGNOSIS — D509 Iron deficiency anemia, unspecified: Secondary | ICD-10-CM | POA: Diagnosis not present

## 2023-10-28 DIAGNOSIS — I10 Essential (primary) hypertension: Secondary | ICD-10-CM | POA: Diagnosis not present

## 2023-10-28 NOTE — Telephone Encounter (Signed)
Will forward this question to his Physician as the Anticoagulation Nursing Staff does not make these decisions about starting or stopping any medications without a Physician Order.

## 2023-10-28 NOTE — Telephone Encounter (Signed)
Pt c/o medication issue:  1. Name of Medication:   warfarin (COUMADIN) 5 MG tablet   2. How are you currently taking this medication (dosage and times per day)?   As prescribed  3. Are you having a reaction (difficulty breathing--STAT)?   4. What is your medication issue?   Daughter Drenda Freeze) stated patient was in ED due to low hemoglobin and was taken off Coumadin on Friday (10/25).  Daughter wants to know if patient should resume Coumadin or stay off this medication.

## 2023-10-29 NOTE — Telephone Encounter (Signed)
Called daughter advised of MD response.  She reports pt had an OV with PCP it was discussed that pt body just isn't producing enough iron/hgb.  Pt will see Hematology on Thursday of this week.  Per Daughter and PCP doesn't think that GI is the cause for low iron/hgb.   Advised to continue to hold warfarin will resend message to MD with updated information.

## 2023-10-30 NOTE — Telephone Encounter (Signed)
Called pt daughter asked that she have Hematology send OV note to Dr. Izora Ribas.   Reports OV is with Jerelyn Charles  on [address] at 10 am.  Wants to know how long it will take MD to determine if pt should restart warfarin.  Advised it depends on how long it takes MD to get fax and time for him to research. No further questions at this time.

## 2023-10-31 DIAGNOSIS — N184 Chronic kidney disease, stage 4 (severe): Secondary | ICD-10-CM | POA: Diagnosis not present

## 2023-10-31 DIAGNOSIS — D509 Iron deficiency anemia, unspecified: Secondary | ICD-10-CM | POA: Diagnosis not present

## 2023-10-31 DIAGNOSIS — N1832 Chronic kidney disease, stage 3b: Secondary | ICD-10-CM | POA: Diagnosis not present

## 2023-10-31 DIAGNOSIS — I10 Essential (primary) hypertension: Secondary | ICD-10-CM | POA: Diagnosis not present

## 2023-11-04 DIAGNOSIS — D509 Iron deficiency anemia, unspecified: Secondary | ICD-10-CM | POA: Diagnosis not present

## 2023-11-04 DIAGNOSIS — D62 Acute posthemorrhagic anemia: Secondary | ICD-10-CM | POA: Diagnosis not present

## 2023-11-05 DIAGNOSIS — H401132 Primary open-angle glaucoma, bilateral, moderate stage: Secondary | ICD-10-CM | POA: Diagnosis not present

## 2023-11-11 DIAGNOSIS — N179 Acute kidney failure, unspecified: Secondary | ICD-10-CM | POA: Diagnosis not present

## 2023-11-11 DIAGNOSIS — D509 Iron deficiency anemia, unspecified: Secondary | ICD-10-CM | POA: Diagnosis not present

## 2023-11-12 ENCOUNTER — Ambulatory Visit: Payer: Medicare Other | Attending: Physician Assistant

## 2023-11-12 ENCOUNTER — Other Ambulatory Visit: Payer: Self-pay

## 2023-11-12 DIAGNOSIS — R2689 Other abnormalities of gait and mobility: Secondary | ICD-10-CM | POA: Diagnosis not present

## 2023-11-12 DIAGNOSIS — M6281 Muscle weakness (generalized): Secondary | ICD-10-CM | POA: Diagnosis not present

## 2023-11-12 NOTE — Therapy (Signed)
OUTPATIENT PHYSICAL THERAPY NEURO EVALUATION   Patient Name: Daniel Love MRN: [id number] DOB:1940/07/21, 83 y.o., male Today's Date: 11/12/2023   PCP: Ernest Mallick, PA-C REFERRING PROVIDER: Vladimir Faster  END OF SESSION:  PT End of Session - 11/12/23 1151     Visit Number 1    Authorization Type Medicare/MCD    Progress Note Due on Visit 10    PT Start Time 1150    PT Stop Time 1230    PT Time Calculation (min) 40 min             Past Medical History:  Diagnosis Date   Anal fissure    hx of    Arthritis    Atrial fibrillation (HCC)    maintaining normal sinus rhythm   Chronic anticoagulation    coumadin   Dyspepsia    Dysrhythmia    Afib   GERD (gastroesophageal reflux disease)    Glaucoma    History of colon polyps 2012   Colonoscopy-Dr. Elnoria Howard    History of echocardiogram    Echocardiogram 2/19: EF 60-65, normal wall motion, trivial AI, moderate to severe MR, mild LAE, mild RAE, mild to moderate TR, PASP 35   History of kidney stones    History of nuclear stress test    Myoview 2/19: EF 42, diffuse HK no significant ischemia, apical lateral scar   Hypertension    Prostate cancer (HCC)    Skin cancer    skin cancer removed from left arm and back   Past Surgical History:  Procedure Laterality Date   BACK SURGERY  12/31/1977   for degenerative disc disease   COLONOSCOPY     COLONOSCOPY WITH ESOPHAGOGASTRODUODENOSCOPY (EGD)     FLEXIBLE SIGMOIDOSCOPY N/A 04/25/2023   Procedure: FLEXIBLE SIGMOIDOSCOPY;  Surgeon: Iva Boop, MD;  Location: [facility];  Service: Gastroenterology;  Laterality: N/A;   HOT HEMOSTASIS N/A 04/25/2023   Procedure: HOT HEMOSTASIS (ARGON PLASMA COAGULATION/BICAP);  Surgeon: Iva Boop, MD;  Location: [facility];  Service: Gastroenterology;  Laterality: N/A;   IR RADIOLOGIST EVAL & MGMT  01/16/2022   IR RADIOLOGIST EVAL & MGMT  06/04/2022   IR RADIOLOGIST EVAL & MGMT  08/05/2023   PROSTATE BIOPSY      RADIOLOGY WITH ANESTHESIA N/A 02/21/2022   Procedure: CT WITH ANESTHESIA CRYOABLATION;  Surgeon: Irish Lack, MD;  Location: [facility];  Service: Radiology;  Laterality: N/A;   TEE WITHOUT CARDIOVERSION N/A 03/04/2019   Procedure: TRANSESOPHAGEAL ECHOCARDIOGRAM (TEE);  Surgeon: Elease Hashimoto Deloris Ping, MD;  Location: [facility];  Service: Cardiovascular;  Laterality: N/A;   toe removed     Right second toe   UPPER GASTROINTESTINAL ENDOSCOPY     Patient Active Problem List   Diagnosis Date Noted   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 04/15/2023   Thoracic aortic aneurysm (HCC) 01/16/2023   Chronic heart failure with preserved ejection fraction (HCC) 01/15/2023   Acquired thrombophilia (HCC) 12/12/2022   Mitral regurgitation and aortic stenosis 07/26/2022   Nonrheumatic tricuspid valve regurgitation 07/26/2022   Malignant neoplasm of prostate (HCC) 04/09/2018   Mitral valve insufficiency 02/26/2018   Benign hypertensive heart disease without heart failure 05/14/2013   Long term (current) use of anticoagulants 10/10/2011   Permanent atrial fibrillation (HCC)     ONSET DATE: 10/2023  REFERRING DIAG: R26.89 (ICD-10-CM) - Other abnormalities of gait and mobility R29.898 (ICD-10-CM) - Other symptoms and signs involving the musculoskeletal system  THERAPY DIAG:  Muscle weakness (generalized)  Rationale for Evaluation and Treatment: Rehabilitation  SUBJECTIVE:                                                                                                                                                                                             SUBJECTIVE STATEMENT: Been having issues with anemia and stage 4 CKD, A-fib/CHF with hx of hospitalization x 5 days in July.  Notes ongoing BLE edema and scrotal edema.  Has been receiving transfusions as of late.  Not currently taking Coumadin due to infusion status, but has f/u with Cardiology on 11/18/23.  Notes his endurance has  improved since receiving transfusions but still lacking strength, stamina and balance with difficulty walking outside and up inclines  Pt accompanied by: family member, daughter-Fran  PERTINENT HISTORY: A-fib, CHF, hx of prostate CA, anemia  PAIN:  Are you having pain? No  PRECAUTIONS: None  RED FLAGS: None   WEIGHT BEARING RESTRICTIONS: No  FALLS: Has patient fallen in last 6 months? No  LIVING ENVIRONMENT: Lives with: lives with their daughter Lives in: House/apartment Stairs: Yes: Internal: flight steps; can reach both and External: 1 steps; grab bars Has following equipment at home: Dan Humphreys - 2 wheeled  PLOF: Independent  PATIENT GOALS:   OBJECTIVE:  Note: Objective measures were completed at Evaluation unless otherwise noted.  DIAGNOSTIC FINDINGS:   Vitals signs: 96%, 74 bpm, 122/78 mmHg  COGNITION: Overall cognitive status: Within functional limits for tasks assessed   SENSATION: WFL  COORDINATION: WNL  EDEMA:  Present BLE, wearing compression stockings  MUSCLE TONE: WNL  MUSCLE LENGTH: WFL    POSTURE: forward head  LOWER EXTREMITY ROM:     WFL LOWER EXTREMITY MMT:    4/5 gross BLE  BED MOBILITY:  indep  TRANSFERS: Assistive device utilized: None  Sit to stand: Modified independence Stand to sit: Complete Independence Chair to chair: Modified independence Floor:  NT--reports difficulty    STAIRS: NT  GAIT: Gait pattern: WFL Distance walked: 1,000 ft ( ) Assistive device utilized: None Level of assistance: Complete Independence Comments: 2.77 ft/sec  FUNCTIONAL TESTS:  5 times sit to stand: 27 sec BUE push-off needed Berg Balance Scale: NT  M-CTSIB  Condition 1: Firm Surface, EO 30 Sec, Normal Sway  Condition 2: Firm Surface, EC 30 Sec, Mild Sway  Condition 3: Foam Surface, EO  Sec,  Sway  Condition 4: Foam Surface, EC  Sec,  Sway    6 Minute Walk Test: 1,000 ft, rates RPE 5/10, 135/77 mmHg, 79 bpm,  98%     TODAY'S TREATMENT:  DATE: 11/12/23    PATIENT EDUCATION: Education details: assessment details, HEP initiation, rationale of intervention Person educated: Patient and Child(ren) Education method: Explanation and Handouts Education comprehension: verbalized understanding  HOME EXERCISE PROGRAM: Access Code: [email or web address] URL: [email or web address] Date: 11/12/2023 Prepared by: Shary Decamp  Exercises - Sit to Stand with Arms Crossed  - 1 x daily - 7 x weekly - 3 sets - 5 reps  GOALS: Goals reviewed with patient? Yes  SHORT TERM GOALS: Target date: same as LTG    LONG TERM GOALS: Target date: 12/10/2023    Patient will be independent in HEP to improve functional outcomes Baseline:  Goal status: INITIAL  2.  Demo improved gait speed/endurance by achieving distance of 1,250 ft during Baseline: 1,000 ft Goal status: INITIAL  3.  Manifest improved BLE strength and reduced risk for falls per time 15 sec 5xSTS test Baseline: 25 sec, dependent on BUE push-off Goal status: INITIAL  4.  Demo independent floor to stand transfer to improve functional mobility Baseline: reports difficulty, dependent on object for support Goal status: INITIAL    ASSESSMENT:  CLINICAL IMPRESSION: Patient is a 83 y.o. male who was seen today for physical therapy evaluation and treatment for other abnormalities of gait/mobility.  Pt has had recent medical complications/exacerbations with BLE and scrotal edema.  Limited activity tolerance and notes decreased endurance in gait and difficulty walking up hill and notes generalized weakness.  Demonstrates BLE weakness and increased risk for falls per time 5xSTS and decrease in gait endurance/speed as compared to healthy, age-matched peers on .  Patient would benefit from PT services to address deficits  and limitations to improve activity tolerance and capacity for more outdoors activities/ambulation   OBJECTIVE IMPAIRMENTS: cardiopulmonary status limiting activity, decreased activity tolerance, decreased mobility, difficulty walking, decreased strength, and increased edema.   ACTIVITY LIMITATIONS: carrying, transfers, and locomotion level  PARTICIPATION LIMITATIONS: community activity, yard work, and exercise routine  PERSONAL FACTORS: Age, Time since onset of injury/illness/exacerbation, and 3+ comorbidities: PMH  are also affecting patient's functional outcome.   REHAB POTENTIAL: Excellent  CLINICAL DECISION MAKING: Evolving/moderate complexity  EVALUATION COMPLEXITY: Moderate  PLAN:  PT FREQUENCY: 1x/week  PT DURATION: 4 weeks  PLANNED INTERVENTIONS: 97110-Therapeutic exercises, 97530- Therapeutic activity, [id number]- Neuromuscular re-education, 97535- Self Care, [id number]- Manual therapy, [phone]- Gait training, [phone]- Canalith repositioning, [phone]- Aquatic Therapy, Patient/Family education, Balance training, Stair training, and DME instructions  PLAN FOR NEXT SESSION: HEP review, floor to stand transfer and it's parts for HEP, treadmill incline intervals?   12:51 PM, 11/12/23 M. Shary Decamp, PT, DPT Physical Therapist- [place] Office Number: [phone]

## 2023-11-13 ENCOUNTER — Telehealth: Payer: Self-pay | Admitting: Internal Medicine

## 2023-11-13 NOTE — Telephone Encounter (Addendum)
Pt c/o swelling/edema: STAT if pt has developed SOB within 24 hours  If swelling, where is the swelling located? Legs and scrotum   How much weight have you gained and in what time span? 6 lbs over the last 3-4 weeks.   Have you gained 2 pounds in a day or 5 pounds in a week? Daughter states he did gain 2lbs in a day.   Do you have a log of your daily weights (if so, list)? He doesn't kept track of daily weights.   Are you currently taking a fluid pill? yes  Are you currently SOB? no  Have you traveled recently in a car or plane for an extended period of time? No  Patient's daughter called stating her father PCP feels patient should be seen ASAP, daughter states he has a lot of edema in his legs and has gone up his legs and into his scrotum and it the size of an orange.  Patient does have upcoming appt this coming Monday, 11/18 with Dr. Izora Ribas.

## 2023-11-13 NOTE — Telephone Encounter (Signed)
Called daughter advised Dr. Izora Ribas does not have any sooner appointments available.  Offered OV with DOD for 11/14/23 Dr. Antoine Poche at 10:30 am.  Daughter wanted this appointment and Monday appointment with Dr. Izora Ribas.   Spoke with MD he is okay with pt having both OV.  If Dr. Antoine Poche feels 11/18/23 OV is not needed will cancel OV.  Informed of office location.  All questions answered.

## 2023-11-13 NOTE — Progress Notes (Unsigned)
Cardiology Office Note:   Date:  11/14/2023  ID:  Daniel Love, DOB 1940/07/13, MRN [id number] PCP: Vladimir Faster  [facility] Providers Cardiologist:  Christell Constant, MD {  History of Present Illness:   Daniel Love is a 83 y.o. male who presents for evaluation of leg swelling.  He has a history of atrial fib.  He is seen usually by Dr. Izora Ribas.   He also has severe mitral regurgitation was felt to be functional MR with prolapse and potential small flail segment as well as moderate to severe tricuspid regurgitation with most recent ejection fraction of 50 to 55%  His last echo was in April.    He was added onto my schedule today because of increasing lower extremity swelling.  He is actually not having any more shortness of breath, PND or orthopnea.  He is not having any palpitations, presyncope or syncope.  His daughter is with him today and she thinks he has had more salt.  His wife died in 10/15/2023 and has had a lot of meals brought in.  He has been drinking 64 ounces of fluid at the direction probably of his nephrologist.  He had to have his diuretic reduced because of his stage IV renal insufficiency.  He now has edema up to his scrotum.  He had about a 5 pound weight gain per the readings that I can see in the last month or so.  Been a steady change over several months.  He denies any chest pressure, neck or arm discomfort.  There is been no cough fevers or chills.  He has been getting iron infusions because of iron deficiency anemia and blood loss.  Has been taken off of warfarin at least temporarily.  He is apparently not having any active bleeding currently still has low iron levels.  Last hemoglobin was 7.5.  ROS: As stated in the HPI and negative for all other systems.  Studies Reviewed:    EKG:   NA  Risk Assessment/Calculations:    CHA2DS2-VASc Score = 4   This indicates a 4.8% annual risk of stroke. The patient's score is based  upon: CHF History: 1 HTN History: 1 Diabetes History: 0 Stroke History: 0 Vascular Disease History: 0 Age Score: 2 Gender Score: 0  Physical Exam:   VS:  BP 112/70 (BP Location: Left Arm, Patient Position: Sitting, Cuff Size: Large)   Pulse 76   Ht 5\' 11"  (1.803 m)   Wt 203 lb 12.8 oz (92.4 kg)   SpO2 97%   BMI 28.42 kg/m    Wt Readings from Last 3 Encounters:  11/14/23 203 lb 12.8 oz (92.4 kg)  10/25/23 198 lb (89.8 kg)  07/22/23 189 lb 3.2 oz (85.8 kg)     GEN: Well nourished, well developed in no acute distress NECK: No JVD; No carotid bruits CARDIAC: Irregular RR, no murmurs, rubs, gallops RESPIRATORY:  Clear to auscultation without rales, wheezing or rhonchi  ABDOMEN: Soft, non-tender, non-distended EXTREMITIES:  No edema; No deformity   ASSESSMENT AND PLAN:   Leg swelling: He has increasing edema.  We are going to manage this by going back up on his Lasix to 40 mg twice daily.  I had a very long conversation with the patient and his daughter explaining the physiology of his right-sided failure.  The most important part here will be compression stockings and keeping his feet elevated.  He is coming back on Monday for already and is scheduled appointment  and can have his creatinine checked.  They are going to reduce his fluid to 48 ounces and he has been talked to about reduced salt.  Anemia: He is going to talk to Dr. Izora Ribas about restarting warfarin versus a Watchman.  He is not having any active bleeding and probably could restart the warfarin with careful follow-up.   Persistent atrial fibrillation: He has good rate control.  Anticoagulation as above.  Moderate to severe mitral regurgitation:  Given his chronic medical conditions he is currently a candidate for intervention but will have close follow-up to discuss the potential or timing of this.  Moderate to severe tricuspid regurgitation: This will be managed conservatively as above.  He also has had moderate  to severe aortic insufficiency:      Asymptomatic thoracic aortic aneurysm:  This is followed by Dr. Laneta Simmers   Heart failure with moderately reduced ejection fraction:  Plan as above.   Follow up with Dr. Izora Ribas on Monday as previously scheduled.   Signed, Rollene Rotunda, MD

## 2023-11-13 NOTE — Telephone Encounter (Signed)
Spoke with daughter per [facility] and patient. She states patient has swelling from his feet all the way to his scrotum. He has gained 6 lbs over 3-4 weeks.   Patient denies any chest pain, SOB, headache, nausea or vomiting.   She wanted an appointment sooner than Monday but we are all booked up.   He is on 40 mg of lasix daily. She states he can not increase his lasix because of his kidney disease. She would like to know what else can he do if he can not get into Monday.

## 2023-11-14 ENCOUNTER — Ambulatory Visit: Payer: Medicare Other | Attending: Cardiology | Admitting: Cardiology

## 2023-11-14 ENCOUNTER — Encounter: Payer: Self-pay | Admitting: Cardiology

## 2023-11-14 VITALS — BP 112/70 | HR 76 | Ht 71.0 in | Wt 203.8 lb

## 2023-11-14 DIAGNOSIS — I340 Nonrheumatic mitral (valve) insufficiency: Secondary | ICD-10-CM | POA: Diagnosis not present

## 2023-11-14 DIAGNOSIS — I351 Nonrheumatic aortic (valve) insufficiency: Secondary | ICD-10-CM | POA: Insufficient documentation

## 2023-11-14 DIAGNOSIS — I4819 Other persistent atrial fibrillation: Secondary | ICD-10-CM | POA: Insufficient documentation

## 2023-11-14 DIAGNOSIS — M7989 Other specified soft tissue disorders: Secondary | ICD-10-CM | POA: Insufficient documentation

## 2023-11-14 MED ORDER — FUROSEMIDE 40 MG PO TABS: 40.0000 mg | ORAL_TABLET | Freq: Two times a day (BID) | ORAL | 3 refills | Status: DC

## 2023-11-14 NOTE — Patient Instructions (Addendum)
Medication Instructions:  Increase Furosemide 40 mg to twice per day. New script sent. *If you need a refill on your cardiac medications before your next appointment, please call your pharmacy*   Follow-Up: At [facility], you and your health needs are our priority.  As part of our continuing mission to provide you with exceptional heart care, we have created designated Provider Care Teams.  These Care Teams include your primary Cardiologist (physician) and Advanced Practice Providers (APPs -  Physician Assistants and Nurse Practitioners) who all work together to provide you with the care you need, when you need it.  We recommend signing up for the patient portal called "MyChart".  Sign up information is provided on this After Visit Summary.  MyChart is used to connect with patients for Virtual Visits (Telemedicine).  Patients are able to view lab/test results, encounter notes, upcoming appointments, etc.  Non-urgent messages can be sent to your provider as well.   To learn more about what you can do with MyChart, go to [email or web address].    Your next appointment:  11/18/2023 at 1:30 pm    Provider:    Dr Izora Ribas  Other Instructions

## 2023-11-18 ENCOUNTER — Ambulatory Visit: Payer: Medicare Other | Attending: Internal Medicine | Admitting: Internal Medicine

## 2023-11-18 ENCOUNTER — Encounter: Payer: Self-pay | Admitting: Internal Medicine

## 2023-11-18 ENCOUNTER — Ambulatory Visit: Payer: Medicare Other | Admitting: Internal Medicine

## 2023-11-18 VITALS — BP 108/60 | HR 78 | Ht 71.0 in | Wt 198.0 lb

## 2023-11-18 DIAGNOSIS — I7121 Aneurysm of the ascending aorta, without rupture: Secondary | ICD-10-CM

## 2023-11-18 DIAGNOSIS — D6869 Other thrombophilia: Secondary | ICD-10-CM

## 2023-11-18 DIAGNOSIS — I340 Nonrheumatic mitral (valve) insufficiency: Secondary | ICD-10-CM

## 2023-11-18 DIAGNOSIS — I4821 Permanent atrial fibrillation: Secondary | ICD-10-CM

## 2023-11-18 DIAGNOSIS — I5032 Chronic diastolic (congestive) heart failure: Secondary | ICD-10-CM

## 2023-11-18 DIAGNOSIS — I361 Nonrheumatic tricuspid (valve) insufficiency: Secondary | ICD-10-CM

## 2023-11-18 DIAGNOSIS — I351 Nonrheumatic aortic (valve) insufficiency: Secondary | ICD-10-CM | POA: Diagnosis not present

## 2023-11-18 NOTE — H&P (View-Only) (Signed)
 Cardiology Office Note:  .    Date:  11/18/2023  ID:  Daniel Love, DOB Dec 26, 1940, MRN [id number] PCP: Vladimir Faster  [facility] Providers Cardiologist:  Christell Constant, MD     CC: Follow up valve disease  History of Present Illness: .    Daniel Love is a 83 y.o. male with AF and frequent GI bleeds with CKD stage IV   Mr. Andersonwith a history of significant mitral valve prolapse, mitral regurgitation, complex anemia, and frequent GI bleeds, presented with worsening symptoms. The patient's management has been challenging due to the recurrent GI bleeds and moderate mitral regurgitation. The patient also has significant aortic insufficiency (AI), mitral regurgitation (MR), and tricuspid regurgitation (TR). The patient's leg swelling has worsened, necessitating an increase in diuretics. The patient is on an SGLT2 inhibitor  The patient reported persistent leg swelling despite the use of thigh-high compression socks and a weight loss of five pounds since the last visit. The patient was previously on chlorthalidone, which was discontinued due to concerns about kidney function.  The patient has stage four kidney disease, which has caused significant anxiety. The patient expressed a strong aversion to the possibility of needing dialysis in the future. The patient's anemia has been a significant issue, with low ferritin levels indicating a lack of iron storage.  The patient has small red blood cells, indicating potential iron deficiency. The patient has been off warfarin for a couple of weeks, and there is a discussion about restarting it. The patient has a history of prostate radiation, which was followed by a regimen that successfully stopped the bleeding.  Relevant histories: .  2022: moderate MR, Aortic dilation seen by Dr. Laneta Simmers 2023: pre-op for renal mass cryoablation. Still had moderate to severe MR 2024: Had Stable echo: mod to severe AI , Mod  severe TR, and Mod severe MR   Social comes with daughter since 2023, married with wife who now has dementia, she passed in 2024 ROS: As per HPI.   Studies Reviewed: .   Cardiac Studies & Procedures     STRESS TESTS  MYOCARDIAL PERFUSION IMAGING 02/17/2018  Narrative  Nuclear stress EF: 42%.  There was no ST segment deviation noted during stress.  This is an intermediate risk study.  The left ventricular ejection fraction is moderately decreased (30-44%).  1. EF 42%, diffuse hypokinesis. 2. Fixed small, mild apical lateral perfusion defect.  Possible prior infarction, no evidence for significant ischemia.  Intermediate risk study primarily due to low EF.   ECHOCARDIOGRAM  ECHOCARDIOGRAM COMPLETE 04/03/2023  Narrative ECHOCARDIOGRAM REPORT    Patient Name:   Daniel Love Date of Exam: 04/03/2023 Medical Rec #:  [id number]        Height:       71.0 in Accession #:    [id number]       Weight:       187.6 lb Date of Birth:  Jul 10, 1940       BSA:          2.052 m Patient Age:    82 years         BP:           110/70 mmHg Patient Gender: M                HR:           81 bpm. Exam Location:  [address]  Procedure: 2D Echo, 3D Echo, Cardiac Doppler, Color Doppler and  Strain Analysis  Indications:     I50.9 CHF  History:         Patient has prior history of Echocardiogram examinations, most recent 11/13/2022. CHF, Abnormal ECG, Arrythmias:Atrial Fibrillation; Risk Factors:Family History of Coronary Artery Disease, Hypertension and Former Smoker. Aortic Root and Ascending Aneurysm.  Sonographer:     Farrel Conners RDCS Referring Phys:  Evern Bio WEAVER Diagnosing Phys: Weston Brass MD  IMPRESSIONS   1. Left ventricular ejection fraction, by estimation, is 55 to 60%. Left ventricular ejection fraction by 3D volume is 61 %. The left ventricle has normal function. The left ventricle has no regional wall motion abnormalities. There is mild left ventricular  hypertrophy. Left ventricular diastolic parameters are indeterminate. 2. Right ventricular systolic function is mildly reduced. The right ventricular size is moderately enlarged. There is mildly elevated pulmonary artery systolic pressure. The estimated right ventricular systolic pressure is 44.2 mmHg. 3. Left atrial size was severely dilated. 4. Right atrial size was severely dilated. 5. Posterior mitral leaflet prolapse with probable small flail segment. Moderate-severe MR with splay artifact in short axis suggesting possible underestimation. MR appeared severe on study from 11/13/22. The mitral valve is abnormal. Moderate to severe mitral valve regurgitation. No evidence of mitral stenosis. 6. Tricuspid valve regurgitation is severe. 7. The aortic valve is tricuspid. There is mild thickening of the aortic valve. Aortic valve regurgitation is moderate with a possible eccentric component from apical long axis view. Descending aorta diastolic reversal TVI 9 cm. No aortic stenosis is present. 8. Aortic dilatation noted. Aneurysm of the ascending aorta, measuring 45 mm. There is borderline dilatation of the aortic root, measuring 42 mm. There is moderate dilatation of the ascending aorta. 9. The inferior vena cava is dilated in size with <50% respiratory variability, suggesting right atrial pressure of 15 mmHg.  Comparison(s): Consider TEE to further evaluate mitral valve if clinically indicated.  FINDINGS Left Ventricle: Left ventricular ejection fraction, by estimation, is 55 to 60%. Left ventricular ejection fraction by 3D volume is 61 %. The left ventricle has normal function. The left ventricle has no regional wall motion abnormalities. Global longitudinal strain performed but not reported based on interpreter judgement due to suboptimal tracking. The left ventricular internal cavity size was normal in size. There is mild left ventricular hypertrophy. Left ventricular diastolic function could not  be evaluated due to mitral regurgitation (moderate or greater). Left ventricular diastolic parameters are indeterminate.  Right Ventricle: The right ventricular size is moderately enlarged. No increase in right ventricular wall thickness. Right ventricular systolic function is mildly reduced. There is mildly elevated pulmonary artery systolic pressure. The tricuspid regurgitant velocity is 2.70 m/s, and with an assumed right atrial pressure of 15 mmHg, the estimated right ventricular systolic pressure is 44.2 mmHg.  Left Atrium: Left atrial size was severely dilated.  Right Atrium: Right atrial size was severely dilated.  Pericardium: There is no evidence of pericardial effusion.  Mitral Valve: Posterior mitral leaflet prolapse with probable small flail segment. Moderate-severe MR with splay artifact in short axis suggesting possible underestimation. MR appeared severe on study from 11/13/22. The mitral valve is abnormal. Moderate to severe mitral valve regurgitation. No evidence of mitral valve stenosis.  Tricuspid Valve: The tricuspid valve is normal in structure. Tricuspid valve regurgitation is severe. No evidence of tricuspid stenosis.  Aortic Valve: The aortic valve is tricuspid. There is mild thickening of the aortic valve. Aortic valve regurgitation is moderate. Aortic regurgitation PHT measures 545 msec. No aortic stenosis is present.  Pulmonic Valve: The pulmonic valve was normal in structure. Pulmonic valve regurgitation is trivial. No evidence of pulmonic stenosis.  Aorta: Aortic dilatation noted. There is borderline dilatation of the aortic root, measuring 42 mm. There is moderate dilatation of the ascending aorta. There is an aneurysm involving the ascending aorta measuring 45 mm.  Venous: The inferior vena cava is dilated in size with less than 50% respiratory variability, suggesting right atrial pressure of 15 mmHg.  IAS/Shunts: No atrial level shunt detected by color flow  Doppler.   LEFT VENTRICLE PLAX 2D LVIDd:         4.50 cm         Diastology LVIDs:         3.00 cm         LV e' medial:    6.09 cm/s LV PW:         1.10 cm         LV E/e' medial:  11.9 LV IVS:        1.10 cm         LV e' lateral:   13.10 cm/s LVOT diam:     2.40 cm         LV E/e' lateral: 5.5 LV SV:         55 LV SV Index:   27              2D LVOT Area:     4.52 cm        Longitudinal Strain 2D Strain GLS  -15.4 % (A2C): 2D Strain GLS  -14.8 % (A3C): 2D Strain GLS  -19.9 % (A4C): 2D Strain GLS  -16.7 % Avg:  3D Volume EF LV 3D EF:    Left ventricul ar ejection fraction by 3D volume is 61 %.  3D Volume EF: 3D EF:        61 % LV EDV:       133 ml LV ESV:       52 ml LV SV:        81 ml  RIGHT VENTRICLE RV Basal diam:  4.70 cm RV Mid diam:    3.90 cm RV S prime:     10.60 cm/s TAPSE (M-mode): 1.5 cm RVSP:           32.2 mmHg  LEFT ATRIUM              Index        RIGHT ATRIUM           Index LA diam:        5.30 cm  2.58 cm/m   RA Pressure: 3.00 mmHg LA Vol (A2C):   127.0 ml 61.89 ml/m  RA Area:     28.30 cm LA Vol (A4C):   118.0 ml 57.51 ml/m  RA Volume:   98.20 ml  47.86 ml/m LA Biplane Vol: 125.0 ml 60.92 ml/m AORTIC VALVE LVOT Vmax:   72.75 cm/s LVOT Vmean:  44.550 cm/s LVOT VTI:    0.121 m AI PHT:      545 msec  AORTA Ao Root diam: 4.20 cm Ao Asc diam:  4.50 cm  MITRAL VALVE               TRICUSPID VALVE MV Area (PHT): cm         TR Peak grad:   29.2 mmHg MV Decel Time: 317 msec    TR Vmax:        270.00 cm/s MR  Peak grad: 92.7 mmHg    Estimated RAP:  3.00 mmHg MR Mean grad: 60.5 mmHg    RVSP:           32.2 mmHg MR Vmax:      481.50 cm/s MR Vmean:     367.5 cm/s   SHUNTS MV E velocity: 72.30 cm/s  Systemic VTI:  0.12 m MV A velocity: 33.30 cm/s  Systemic Diam: 2.40 cm MV E/A ratio:  2.17  Weston Brass MD Electronically signed by Weston Brass MD Signature Date/Time: 04/03/2023/4:59:47 PM    Final (Updated)    TEE  ECHO TEE 03/04/2019  Narrative TRANSESOPHOGEAL ECHO REPORT    Patient Name:   Daniel Love Date of Exam: 03/04/2019 Medical Rec #:  [id number]      Height:       71.0 in Accession #:    [id number]     Weight:       180.0 lb Date of Birth:  [birth year]     BSA:          2.02 m Patient Age:    78 years       BP:           159/99 mmHg Patient Gender: M              HR:           67 bpm. Exam Location:  Outpatient   Procedure: Transesophageal Echo, Color Doppler, Cardiac Doppler and 3d  Indications:     mitral regurgitation  History:         Patient has prior history of Echocardiogram examinations, most recent 01/02/2019.  Sonographer:     Delcie Roch Referring Phys:  [id number] Veverly Fells SKAINS Diagnosing Phys: Donato Schultz MD    PROCEDURE: Consent was requested emergently by emergency room physicain. The patient's vital signs; including heart rate, blood pressure, and oxygen saturation; remained stable throughout the procedure. The patient developed no complications during the procedure.  IMPRESSIONS   1. The left ventricle has mild-moderately reduced systolic function, with an ejection fraction of 40-45%. The cavity size was normal. Left ventricular diffuse hypokinesis. 2. The right ventricle has normal systolc function. The cavity was normal. There is no increase in right ventricular wall thickness. 3. Left atrial size was severely dilated. 4. 6.1cm left atrial diameter in 4 chamber view. 5. Right atrial size was severely dilated. 6. The mitral valve is myxomatous. Moderate thickening of the posterior mitral valve leaflet. Mitral valve regurgitation is moderate by color flow Doppler. The MR jet is centrally-directed. 7. MR PISA radius 0.5cm MR ERO 0.12 cm2 MR Volume 23ml.  8. The tricuspid valve was myxomatous. Tricuspid valve regurgitation is moderate-severe. 9. The aortic valve is tricuspid Mild thickening of the aortic valve Mild calcification of the aortic valve.  Aortic valve regurgitation is mild by color flow Doppler. The jet is posteriorly-directed. 10. The pulmonic valve was normal in structure. 11. There is dilatation of the ascending aorta measuring 41 mm. 12. Pulmonary hypertension is mild.  FINDINGS Left Ventricle: The left ventricle has mild-moderately reduced systolic function, with an ejection fraction of 40-45%. The cavity size was normal. There is no increase in left ventricular wall thickness. Left ventricular diffuse hypokinesis. Right Ventricle: The right ventricle has normal systolic function. The cavity was normal. There is no increase in right ventricular wall thickness. Left Atrium: Left atrial size was severely dilated. 6.1cm left atrial diameter in 4 chamber view. Right Atrium: Right atrial size was severely dilated.  Right atrial pressure is estimated at 8 mmHg. Interatrial Septum: No atrial level shunt detected by color flow Doppler. Saline contrast bubble study was negative, with no evidence of any interatrial shunt. Pericardium: There is no evidence of pericardial effusion. Mitral Valve: The mitral valve is myxomatous. Moderate thickening of the posterior mitral valve leaflet. Mitral valve regurgitation is moderate by color flow Doppler. The MR jet is centrally-directed. Pulmonary venous flow is normal. MR PISA radius 0.5cm MR ERO 0.12 cm2 MR Volume 23ml.  Tricuspid Valve: The tricuspid valve was myxomatous. Tricuspid valve regurgitation is moderate-severe by color flow Doppler. Aortic Valve: The aortic valve is tricuspid Mild thickening of the aortic valve Mild calcification of the aortic valve. Aortic valve regurgitation is mild by color flow Doppler. The jet is posteriorly-directed. Pulmonic Valve: The pulmonic valve was normal in structure. Pulmonic valve regurgitation is not visualized by color flow Doppler. Aorta: There is dilatation of the ascending aorta measuring 41 mm. Pulmonary Artery: Pulmonary hypertension is  mild. Venous: The inferior vena cava is normal in size with greater than 50% respiratory variability.  RIGHT ATRIUM RA Pressure: 8 mmHg MR Peak grad: 117.5 mmHg MR Mean grad: 84.0 mmHg MR Vmax:      542.00 cm/s MR Vmean:     436.0 cm/s MR PISA:      1.57   Donato Schultz MD Electronically signed by Donato Schultz MD Signature Date/Time: 03/04/2019/3:49:35 PM    Final             Physical Exam:    VS:  BP 108/60   Pulse 78   Ht 5\' 11"  (1.803 m)   Wt 198 lb (89.8 kg)   SpO2 94%   BMI 27.62 kg/m    Wt Readings from Last 3 Encounters:  11/18/23 198 lb (89.8 kg)  11/14/23 203 lb 12.8 oz (92.4 kg)  10/25/23 198 lb (89.8 kg)    Gen: Chronically ill , elderly male   Neck: +1 JVD Cardiac: No Rubs or Gallops, holosystolic murmur, new diastolic murmur, IRIR, +2 radial pulses Respiratory: Clear to auscultation bilaterally, normal effort, normal respiratory rate GI: Soft, nontender, non-distended  MS: +2 edema;  moves all extremities Integument: Skin feels warm Neuro:  At time of evaluation, alert and oriented to person/place/time/situation  Psych: Normal affect, patient feels ok   ASSESSMENT AND PLAN: .    Mitral Valve Prolapse with Mitral Regurgitation - Significant mitral valve prolapse with moderate to severe mitral regurgitation. Symptoms include leg swelling and breathlessness. Managed with diuretics and SGLT2 inhibitors. Discussed restarting warfarin considering history of GI bleeds and anemia. Discussed TEE to assess valve function and candidacy for Watchman device. Explained risks of bleeding with warfarin and potential benefits of Watchman device to reduce stroke risk and discontinue warfarin. Watchman requires short-term anticoagulation and potential for future bleeding issues. - Restart warfarin vai coumadin clinic - we have offered transesophageal echocardiogram (TEE)   [facility] has been requested to perform a transesophageal echocardiogram on Daniel Love for  [employer].  After careful review of history and examination, the risks and benefits of transesophageal echocardiogram have been explained including risks of esophageal damage, perforation (1:10,000 risk), bleeding, pharyngeal hematoma as well as other potential complications associated with conscious sedation including aspiration, arrhythmia, respiratory failure and death. Alternatives to treatment were discussed, questions were answered. Patient is willing to proceed.    Aortic Insufficiency and Tricuspid Regurgitation Significant aortic insufficiency and tricuspid regurgitation, likely exacerbated by atrial fibrillation and fluid overload. Aortic aneurysm  contributing to valve issues. Surgical intervention deemed unlikely due to complexity and overall condition. TEE will provide detailed valve assessment and guide potential interventions. - Continue diuretics - TEE as above; I do not suspect he is a surgical candidate but will CC his surgeon Dr. Laneta Simmers  Atrial Fibrillation - Permanent atrial fibrillation contributing to stroke risk and exacerbating valve regurgitation. Discussed Watchman device to reduce stroke risk and allow discontinuation of warfarin. Watchman requires short-term anticoagulation and potential for future bleeding issues. - Evaluate candidacy for Watchman device during TEE if paitnet is amenable - Restart warfarin  Chronic Kidney Disease Stage 4 - Chronic kidney disease stage 4 with fluctuating creatinine levels. Kidney function closely monitored due to impact of diuretics and fluid management. Balancing heart failure symptoms and preserving kidney function is critical. Worsening kidney function may necessitate adjusting diuretics. - Order BMP to assess kidney function - Adjust diuretic dosage based on lab results  Anemia Complex anemia with multifactorial etiology, including iron deficiency and chronic disease. Received iron infusions and blood transfusions. Anemia  significantly impacts symptoms, including fatigue and breathlessness. Watchman procedure aims to reduce bleeding risk from warfarin but will not address anemia. - LAA- as above; as per hematology and GI  Time Spent Directly with Patient:   I have spent a total of 68 minutes with the patient reviewing notes, imaging, EKGs, labs, and examining the patient as well as establishing an assessment and plan that was discussed personally with the patient. Discussed disease state education, using  cardiac modeling. We teleconference his other daughter Waynetta Sandy, in from [place].    After second evaluation, as a family, they were amenable to TEE.     Riley Lam, MD FASE [facility] Cardiologist [facility]  [address] [place], [address] [place], [place] [address] [phone]  4:37 PM

## 2023-11-18 NOTE — Progress Notes (Signed)
Cardiology Office Note:  .    Date:  11/18/2023  ID:  Matthew Saras, DOB Dec 26, 1940, MRN [id number] PCP: Vladimir Faster  [facility] Providers Cardiologist:  Christell Constant, MD     CC: Follow up valve disease  History of Present Illness: .    [name] is a 83 y.o. male with AF and frequent GI bleeds with CKD stage IV   Mr. Andersonwith a history of significant mitral valve prolapse, mitral regurgitation, complex anemia, and frequent GI bleeds, presented with worsening symptoms. The patient's management has been challenging due to the recurrent GI bleeds and moderate mitral regurgitation. The patient also has significant aortic insufficiency (AI), mitral regurgitation (MR), and tricuspid regurgitation (TR). The patient's leg swelling has worsened, necessitating an increase in diuretics. The patient is on an SGLT2 inhibitor  The patient reported persistent leg swelling despite the use of thigh-high compression socks and a weight loss of five pounds since the last visit. The patient was previously on chlorthalidone, which was discontinued due to concerns about kidney function.  The patient has stage four kidney disease, which has caused significant anxiety. The patient expressed a strong aversion to the possibility of needing dialysis in the future. The patient's anemia has been a significant issue, with low ferritin levels indicating a lack of iron storage.  The patient has small red blood cells, indicating potential iron deficiency. The patient has been off warfarin for a couple of weeks, and there is a discussion about restarting it. The patient has a history of prostate radiation, which was followed by a regimen that successfully stopped the bleeding.  Relevant histories: .  2022: moderate MR, Aortic dilation seen by Dr. Laneta Simmers 2023: pre-op for renal mass cryoablation. Still had moderate to severe MR 2024: Had Stable echo: mod to severe AI , Mod  severe TR, and Mod severe MR   Social comes with daughter since 2023, married with wife who now has dementia, she passed in 2024 ROS: As per HPI.   Studies Reviewed: .   Cardiac Studies & Procedures     STRESS TESTS  MYOCARDIAL PERFUSION IMAGING 02/17/2018  Narrative  Nuclear stress EF: 42%.  There was no ST segment deviation noted during stress.  This is an intermediate risk study.  The left ventricular ejection fraction is moderately decreased (30-44%).  1. EF 42%, diffuse hypokinesis. 2. Fixed small, mild apical lateral perfusion defect.  Possible prior infarction, no evidence for significant ischemia.  Intermediate risk study primarily due to low EF.   ECHOCARDIOGRAM  ECHOCARDIOGRAM COMPLETE 04/03/2023  Narrative ECHOCARDIOGRAM REPORT    Patient Name:   AVYN HENNESY Date of Exam: 04/03/2023 Medical Rec #:  [id number]        Height:       71.0 in Accession #:    [id number]       Weight:       187.6 lb Date of Birth:  Jul 10, 1940       BSA:          2.052 m Patient Age:    82 years         BP:           110/70 mmHg Patient Gender: M                HR:           81 bpm. Exam Location:  [address]  Procedure: 2D Echo, 3D Echo, Cardiac Doppler, Color Doppler and  Strain Analysis  Indications:     I50.9 CHF  History:         Patient has prior history of Echocardiogram examinations, most recent 11/13/2022. CHF, Abnormal ECG, Arrythmias:Atrial Fibrillation; Risk Factors:Family History of Coronary Artery Disease, Hypertension and Former Smoker. Aortic Root and Ascending Aneurysm.  Sonographer:     Farrel Conners RDCS Referring Phys:  Evern Bio WEAVER Diagnosing Phys: Weston Brass MD  IMPRESSIONS   1. Left ventricular ejection fraction, by estimation, is 55 to 60%. Left ventricular ejection fraction by 3D volume is 61 %. The left ventricle has normal function. The left ventricle has no regional wall motion abnormalities. There is mild left ventricular  hypertrophy. Left ventricular diastolic parameters are indeterminate. 2. Right ventricular systolic function is mildly reduced. The right ventricular size is moderately enlarged. There is mildly elevated pulmonary artery systolic pressure. The estimated right ventricular systolic pressure is 44.2 mmHg. 3. Left atrial size was severely dilated. 4. Right atrial size was severely dilated. 5. Posterior mitral leaflet prolapse with probable small flail segment. Moderate-severe MR with splay artifact in short axis suggesting possible underestimation. MR appeared severe on study from 11/13/22. The mitral valve is abnormal. Moderate to severe mitral valve regurgitation. No evidence of mitral stenosis. 6. Tricuspid valve regurgitation is severe. 7. The aortic valve is tricuspid. There is mild thickening of the aortic valve. Aortic valve regurgitation is moderate with a possible eccentric component from apical long axis view. Descending aorta diastolic reversal TVI 9 cm. No aortic stenosis is present. 8. Aortic dilatation noted. Aneurysm of the ascending aorta, measuring 45 mm. There is borderline dilatation of the aortic root, measuring 42 mm. There is moderate dilatation of the ascending aorta. 9. The inferior vena cava is dilated in size with <50% respiratory variability, suggesting right atrial pressure of 15 mmHg.  Comparison(s): Consider TEE to further evaluate mitral valve if clinically indicated.  FINDINGS Left Ventricle: Left ventricular ejection fraction, by estimation, is 55 to 60%. Left ventricular ejection fraction by 3D volume is 61 %. The left ventricle has normal function. The left ventricle has no regional wall motion abnormalities. Global longitudinal strain performed but not reported based on interpreter judgement due to suboptimal tracking. The left ventricular internal cavity size was normal in size. There is mild left ventricular hypertrophy. Left ventricular diastolic function could not  be evaluated due to mitral regurgitation (moderate or greater). Left ventricular diastolic parameters are indeterminate.  Right Ventricle: The right ventricular size is moderately enlarged. No increase in right ventricular wall thickness. Right ventricular systolic function is mildly reduced. There is mildly elevated pulmonary artery systolic pressure. The tricuspid regurgitant velocity is 2.70 m/s, and with an assumed right atrial pressure of 15 mmHg, the estimated right ventricular systolic pressure is 44.2 mmHg.  Left Atrium: Left atrial size was severely dilated.  Right Atrium: Right atrial size was severely dilated.  Pericardium: There is no evidence of pericardial effusion.  Mitral Valve: Posterior mitral leaflet prolapse with probable small flail segment. Moderate-severe MR with splay artifact in short axis suggesting possible underestimation. MR appeared severe on study from 11/13/22. The mitral valve is abnormal. Moderate to severe mitral valve regurgitation. No evidence of mitral valve stenosis.  Tricuspid Valve: The tricuspid valve is normal in structure. Tricuspid valve regurgitation is severe. No evidence of tricuspid stenosis.  Aortic Valve: The aortic valve is tricuspid. There is mild thickening of the aortic valve. Aortic valve regurgitation is moderate. Aortic regurgitation PHT measures 545 msec. No aortic stenosis is present.  Pulmonic Valve: The pulmonic valve was normal in structure. Pulmonic valve regurgitation is trivial. No evidence of pulmonic stenosis.  Aorta: Aortic dilatation noted. There is borderline dilatation of the aortic root, measuring 42 mm. There is moderate dilatation of the ascending aorta. There is an aneurysm involving the ascending aorta measuring 45 mm.  Venous: The inferior vena cava is dilated in size with less than 50% respiratory variability, suggesting right atrial pressure of 15 mmHg.  IAS/Shunts: No atrial level shunt detected by color flow  Doppler.   LEFT VENTRICLE PLAX 2D LVIDd:         4.50 cm         Diastology LVIDs:         3.00 cm         LV e' medial:    6.09 cm/s LV PW:         1.10 cm         LV E/e' medial:  11.9 LV IVS:        1.10 cm         LV e' lateral:   13.10 cm/s LVOT diam:     2.40 cm         LV E/e' lateral: 5.5 LV SV:         55 LV SV Index:   27              2D LVOT Area:     4.52 cm        Longitudinal Strain 2D Strain GLS  -15.4 % (A2C): 2D Strain GLS  -14.8 % (A3C): 2D Strain GLS  -19.9 % (A4C): 2D Strain GLS  -16.7 % Avg:  3D Volume EF LV 3D EF:    Left ventricul ar ejection fraction by 3D volume is 61 %.  3D Volume EF: 3D EF:        61 % LV EDV:       133 ml LV ESV:       52 ml LV SV:        81 ml  RIGHT VENTRICLE RV Basal diam:  4.70 cm RV Mid diam:    3.90 cm RV S prime:     10.60 cm/s TAPSE (M-mode): 1.5 cm RVSP:           32.2 mmHg  LEFT ATRIUM              Index        RIGHT ATRIUM           Index LA diam:        5.30 cm  2.58 cm/m   RA Pressure: 3.00 mmHg LA Vol (A2C):   127.0 ml 61.89 ml/m  RA Area:     28.30 cm LA Vol (A4C):   118.0 ml 57.51 ml/m  RA Volume:   98.20 ml  47.86 ml/m LA Biplane Vol: 125.0 ml 60.92 ml/m AORTIC VALVE LVOT Vmax:   72.75 cm/s LVOT Vmean:  44.550 cm/s LVOT VTI:    0.121 m AI PHT:      545 msec  AORTA Ao Root diam: 4.20 cm Ao Asc diam:  4.50 cm  MITRAL VALVE               TRICUSPID VALVE MV Area (PHT): cm         TR Peak grad:   29.2 mmHg MV Decel Time: 317 msec    TR Vmax:        270.00 cm/s MR  Peak grad: 92.7 mmHg    Estimated RAP:  3.00 mmHg MR Mean grad: 60.5 mmHg    RVSP:           32.2 mmHg MR Vmax:      481.50 cm/s MR Vmean:     367.5 cm/s   SHUNTS MV E velocity: 72.30 cm/s  Systemic VTI:  0.12 m MV A velocity: 33.30 cm/s  Systemic Diam: 2.40 cm MV E/A ratio:  2.17  Weston Brass MD Electronically signed by Weston Brass MD Signature Date/Time: 04/03/2023/4:59:47 PM    Final (Updated)    TEE  ECHO TEE 03/04/2019  Narrative TRANSESOPHOGEAL ECHO REPORT    Patient Name:   MAMADOU MINICOZZI Date of Exam: 03/04/2019 Medical Rec #:  [id number]      Height:       71.0 in Accession #:    [id number]     Weight:       180.0 lb Date of Birth:  [birth year]     BSA:          2.02 m Patient Age:    78 years       BP:           159/99 mmHg Patient Gender: M              HR:           67 bpm. Exam Location:  Outpatient   Procedure: Transesophageal Echo, Color Doppler, Cardiac Doppler and 3d  Indications:     mitral regurgitation  History:         Patient has prior history of Echocardiogram examinations, most recent 01/02/2019.  Sonographer:     Delcie Roch Referring Phys:  [id number] Veverly Fells SKAINS Diagnosing Phys: Donato Schultz MD    PROCEDURE: Consent was requested emergently by emergency room physicain. The patient's vital signs; including heart rate, blood pressure, and oxygen saturation; remained stable throughout the procedure. The patient developed no complications during the procedure.  IMPRESSIONS   1. The left ventricle has mild-moderately reduced systolic function, with an ejection fraction of 40-45%. The cavity size was normal. Left ventricular diffuse hypokinesis. 2. The right ventricle has normal systolc function. The cavity was normal. There is no increase in right ventricular wall thickness. 3. Left atrial size was severely dilated. 4. 6.1cm left atrial diameter in 4 chamber view. 5. Right atrial size was severely dilated. 6. The mitral valve is myxomatous. Moderate thickening of the posterior mitral valve leaflet. Mitral valve regurgitation is moderate by color flow Doppler. The MR jet is centrally-directed. 7. MR PISA radius 0.5cm MR ERO 0.12 cm2 MR Volume 23ml.  8. The tricuspid valve was myxomatous. Tricuspid valve regurgitation is moderate-severe. 9. The aortic valve is tricuspid Mild thickening of the aortic valve Mild calcification of the aortic valve.  Aortic valve regurgitation is mild by color flow Doppler. The jet is posteriorly-directed. 10. The pulmonic valve was normal in structure. 11. There is dilatation of the ascending aorta measuring 41 mm. 12. Pulmonary hypertension is mild.  FINDINGS Left Ventricle: The left ventricle has mild-moderately reduced systolic function, with an ejection fraction of 40-45%. The cavity size was normal. There is no increase in left ventricular wall thickness. Left ventricular diffuse hypokinesis. Right Ventricle: The right ventricle has normal systolic function. The cavity was normal. There is no increase in right ventricular wall thickness. Left Atrium: Left atrial size was severely dilated. 6.1cm left atrial diameter in 4 chamber view. Right Atrium: Right atrial size was severely dilated.  Right atrial pressure is estimated at 8 mmHg. Interatrial Septum: No atrial level shunt detected by color flow Doppler. Saline contrast bubble study was negative, with no evidence of any interatrial shunt. Pericardium: There is no evidence of pericardial effusion. Mitral Valve: The mitral valve is myxomatous. Moderate thickening of the posterior mitral valve leaflet. Mitral valve regurgitation is moderate by color flow Doppler. The MR jet is centrally-directed. Pulmonary venous flow is normal. MR PISA radius 0.5cm MR ERO 0.12 cm2 MR Volume 23ml.  Tricuspid Valve: The tricuspid valve was myxomatous. Tricuspid valve regurgitation is moderate-severe by color flow Doppler. Aortic Valve: The aortic valve is tricuspid Mild thickening of the aortic valve Mild calcification of the aortic valve. Aortic valve regurgitation is mild by color flow Doppler. The jet is posteriorly-directed. Pulmonic Valve: The pulmonic valve was normal in structure. Pulmonic valve regurgitation is not visualized by color flow Doppler. Aorta: There is dilatation of the ascending aorta measuring 41 mm. Pulmonary Artery: Pulmonary hypertension is  mild. Venous: The inferior vena cava is normal in size with greater than 50% respiratory variability.  RIGHT ATRIUM RA Pressure: 8 mmHg MR Peak grad: 117.5 mmHg MR Mean grad: 84.0 mmHg MR Vmax:      542.00 cm/s MR Vmean:     436.0 cm/s MR PISA:      1.57   Donato Schultz MD Electronically signed by Donato Schultz MD Signature Date/Time: 03/04/2019/3:49:35 PM    Final             Physical Exam:    VS:  BP 108/60   Pulse 78   Ht 5\' 11"  (1.803 m)   Wt 198 lb (89.8 kg)   SpO2 94%   BMI 27.62 kg/m    Wt Readings from Last 3 Encounters:  11/18/23 198 lb (89.8 kg)  11/14/23 203 lb 12.8 oz (92.4 kg)  10/25/23 198 lb (89.8 kg)    Gen: Chronically ill , elderly male   Neck: +1 JVD Cardiac: No Rubs or Gallops, holosystolic murmur, new diastolic murmur, IRIR, +2 radial pulses Respiratory: Clear to auscultation bilaterally, normal effort, normal respiratory rate GI: Soft, nontender, non-distended  MS: +2 edema;  moves all extremities Integument: Skin feels warm Neuro:  At time of evaluation, alert and oriented to person/place/time/situation  Psych: Normal affect, patient feels ok   ASSESSMENT AND PLAN: .    Mitral Valve Prolapse with Mitral Regurgitation - Significant mitral valve prolapse with moderate to severe mitral regurgitation. Symptoms include leg swelling and breathlessness. Managed with diuretics and SGLT2 inhibitors. Discussed restarting warfarin considering history of GI bleeds and anemia. Discussed TEE to assess valve function and candidacy for Watchman device. Explained risks of bleeding with warfarin and potential benefits of Watchman device to reduce stroke risk and discontinue warfarin. Watchman requires short-term anticoagulation and potential for future bleeding issues. - Restart warfarin vai coumadin clinic - we have offered transesophageal echocardiogram (TEE)   [facility] has been requested to perform a transesophageal echocardiogram on Matthew Saras for  [employer].  After careful review of history and examination, the risks and benefits of transesophageal echocardiogram have been explained including risks of esophageal damage, perforation (1:10,000 risk), bleeding, pharyngeal hematoma as well as other potential complications associated with conscious sedation including aspiration, arrhythmia, respiratory failure and death. Alternatives to treatment were discussed, questions were answered. Patient is willing to proceed.    Aortic Insufficiency and Tricuspid Regurgitation Significant aortic insufficiency and tricuspid regurgitation, likely exacerbated by atrial fibrillation and fluid overload. Aortic aneurysm  contributing to valve issues. Surgical intervention deemed unlikely due to complexity and overall condition. TEE will provide detailed valve assessment and guide potential interventions. - Continue diuretics - TEE as above; I do not suspect he is a surgical candidate but will CC his surgeon Dr. Laneta Simmers  Atrial Fibrillation - Permanent atrial fibrillation contributing to stroke risk and exacerbating valve regurgitation. Discussed Watchman device to reduce stroke risk and allow discontinuation of warfarin. Watchman requires short-term anticoagulation and potential for future bleeding issues. - Evaluate candidacy for Watchman device during TEE if paitnet is amenable - Restart warfarin  Chronic Kidney Disease Stage 4 - Chronic kidney disease stage 4 with fluctuating creatinine levels. Kidney function closely monitored due to impact of diuretics and fluid management. Balancing heart failure symptoms and preserving kidney function is critical. Worsening kidney function may necessitate adjusting diuretics. - Order BMP to assess kidney function - Adjust diuretic dosage based on lab results  Anemia Complex anemia with multifactorial etiology, including iron deficiency and chronic disease. Received iron infusions and blood transfusions. Anemia  significantly impacts symptoms, including fatigue and breathlessness. Watchman procedure aims to reduce bleeding risk from warfarin but will not address anemia. - LAA- as above; as per hematology and GI  Time Spent Directly with Patient:   I have spent a total of 68 minutes with the patient reviewing notes, imaging, EKGs, labs, and examining the patient as well as establishing an assessment and plan that was discussed personally with the patient. Discussed disease state education, using  cardiac modeling. We teleconference his other daughter Waynetta Sandy, in from [place].    After second evaluation, as a family, they were amenable to TEE.     Riley Lam, MD FASE [facility] Cardiologist [facility]  [address] [place], [address] [place], [place] [address] [phone]  4:37 PM

## 2023-11-18 NOTE — Patient Instructions (Signed)
Medication Instructions:  Your physician has recommended you make the following change in your medication:  RESTART: warfarin (Coumadin) as directed by the Coumadin Clinic  *If you need a refill on your cardiac medications before your next appointment, please call your pharmacy*   Lab Work: CBC, BMP  If you have labs (blood work) drawn today and your tests are completely normal, you will receive your results only by: MyChart Message (if you have MyChart) OR A paper copy in the mail If you have any lab test that is abnormal or we need to change your treatment, we will call you to review the results.   Testing/Procedures: Your physician has requested that you have a TEE. During a TEE, sound waves are used to create images of your heart. It provides your doctor with information about the size and shape of your heart and how well your heart's chambers and valves are working. In this test, a transducer is attached to the end of a flexible tube that's guided down your throat and into your esophagus (the tube leading from you mouth to your stomach) to get a more detailed image of your heart. You are not awake for the procedure. Please see the instruction sheet given to you today. For further information please visit [email or web address]     Follow-Up:To be determined At [facility], you and your health needs are our priority.  As part of our continuing mission to provide you with exceptional heart care, we have created designated Provider Care Teams.  These Care Teams include your primary Cardiologist (physician) and Advanced Practice Providers (APPs -  Physician Assistants and Nurse Practitioners) who all work together to provide you with the care you need, when you need it.   Provider:   Christell Constant, MD

## 2023-11-19 ENCOUNTER — Telehealth: Payer: Self-pay

## 2023-11-19 ENCOUNTER — Ambulatory Visit: Payer: Medicare Other

## 2023-11-19 DIAGNOSIS — R2689 Other abnormalities of gait and mobility: Secondary | ICD-10-CM

## 2023-11-19 DIAGNOSIS — M6281 Muscle weakness (generalized): Secondary | ICD-10-CM | POA: Diagnosis not present

## 2023-11-19 LAB — BASIC METABOLIC PANEL
BUN/Creatinine Ratio: 17 (ref 10–24)
BUN: 29 mg/dL — ABNORMAL HIGH (ref 8–27)
CO2: 24 mmol/L (ref 20–29)
Calcium: 9.1 mg/dL (ref 8.6–10.2)
Chloride: 104 mmol/L (ref 96–106)
Creatinine, Ser: 1.73 mg/dL — ABNORMAL HIGH (ref 0.76–1.27)
Glucose: 94 mg/dL (ref 70–99)
Potassium: 3.9 mmol/L (ref 3.5–5.2)
Sodium: 144 mmol/L (ref 134–144)
eGFR: 39 mL/min/{1.73_m2} — ABNORMAL LOW (ref >59)

## 2023-11-19 LAB — CBC
Hematocrit: 36.1 % — ABNORMAL LOW (ref 37.5–51.0)
Hemoglobin: 10.4 g/dL — ABNORMAL LOW (ref 13.0–17.7)
MCH: 23.4 pg — ABNORMAL LOW (ref 26.6–33.0)
MCHC: 28.8 g/dL — ABNORMAL LOW (ref 31.5–35.7)
MCV: 81 fL (ref 79–97)
Platelets: 215 10*3/uL (ref 150–450)
RBC: 4.45 x10E6/uL (ref 4.14–5.80)
RDW: 26.4 % — ABNORMAL HIGH (ref 11.6–15.4)
WBC: 5.4 10*3/uL (ref 3.4–10.8)

## 2023-11-19 NOTE — Telephone Encounter (Signed)
I spoke to patient and he restarted Coumadin 11/18.  We will check INR 11/26

## 2023-11-19 NOTE — Therapy (Signed)
OUTPATIENT PHYSICAL THERAPY NEURO TREATMENT   Patient Name: Daniel Love MRN: [id number] DOB:06/03/1940, 83 y.o., male Today's Date: 11/19/2023   PCP: Ernest Mallick, PA-C REFERRING PROVIDER: Vladimir Faster  END OF SESSION:  PT End of Session - 11/19/23 1105     Visit Number 2    Number of Visits 5    Date for PT Re-Evaluation 12/10/23    Authorization Type Medicare/MCD    Progress Note Due on Visit 10    PT Start Time 1100    PT Stop Time 1145    PT Time Calculation (min) 45 min             Past Medical History:  Diagnosis Date   Anal fissure    hx of    Arthritis    Atrial fibrillation (HCC)    maintaining normal sinus rhythm   Chronic anticoagulation    coumadin   Dyspepsia    Dysrhythmia    Afib   GERD (gastroesophageal reflux disease)    Glaucoma    History of colon polyps 2012   Colonoscopy-Dr. Elnoria Howard    History of echocardiogram    Echocardiogram 2/19: EF 60-65, normal wall motion, trivial AI, moderate to severe MR, mild LAE, mild RAE, mild to moderate TR, PASP 35   History of kidney stones    History of nuclear stress test    Myoview 2/19: EF 42, diffuse HK no significant ischemia, apical lateral scar   Hypertension    Prostate cancer (HCC)    Skin cancer    skin cancer removed from left arm and back   Past Surgical History:  Procedure Laterality Date   BACK SURGERY  12/31/1977   for degenerative disc disease   COLONOSCOPY     COLONOSCOPY WITH ESOPHAGOGASTRODUODENOSCOPY (EGD)     FLEXIBLE SIGMOIDOSCOPY N/A 04/25/2023   Procedure: FLEXIBLE SIGMOIDOSCOPY;  Surgeon: Iva Boop, MD;  Location: [facility];  Service: Gastroenterology;  Laterality: N/A;   HOT HEMOSTASIS N/A 04/25/2023   Procedure: HOT HEMOSTASIS (ARGON PLASMA COAGULATION/BICAP);  Surgeon: Iva Boop, MD;  Location: [facility];  Service: Gastroenterology;  Laterality: N/A;   IR RADIOLOGIST EVAL & MGMT  01/16/2022   IR RADIOLOGIST EVAL & MGMT  06/04/2022    IR RADIOLOGIST EVAL & MGMT  08/05/2023   PROSTATE BIOPSY     RADIOLOGY WITH ANESTHESIA N/A 02/21/2022   Procedure: CT WITH ANESTHESIA CRYOABLATION;  Surgeon: Irish Lack, MD;  Location: [facility];  Service: Radiology;  Laterality: N/A;   TEE WITHOUT CARDIOVERSION N/A 03/04/2019   Procedure: TRANSESOPHAGEAL ECHOCARDIOGRAM (TEE);  Surgeon: Elease Hashimoto Deloris Ping, MD;  Location: [facility];  Service: Cardiovascular;  Laterality: N/A;   toe removed     Right second toe   UPPER GASTROINTESTINAL ENDOSCOPY     Patient Active Problem List   Diagnosis Date Noted   Nonrheumatic aortic valve insufficiency 11/18/2023   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 04/15/2023   Thoracic aortic aneurysm (HCC) 01/16/2023   Chronic heart failure with preserved ejection fraction (HCC) 01/15/2023   Acquired thrombophilia (HCC) 12/12/2022   Mitral regurgitation and aortic stenosis 07/26/2022   Nonrheumatic tricuspid valve regurgitation 07/26/2022   Malignant neoplasm of prostate (HCC) 04/09/2018   Mitral valve insufficiency 02/26/2018   Benign hypertensive heart disease without heart failure 05/14/2013   Long term (current) use of anticoagulants 10/10/2011   Permanent atrial fibrillation (HCC)     ONSET DATE: 10/2023  REFERRING DIAG: R26.89 (ICD-10-CM) - Other abnormalities of  gait and mobility R29.898 (ICD-10-CM) - Other symptoms and signs involving the musculoskeletal system  THERAPY DIAG:  Muscle weakness (generalized)  Other abnormalities of gait and mobility  Rationale for Evaluation and Treatment: Rehabilitation  SUBJECTIVE:                                                                                                                                                                                             SUBJECTIVE STATEMENT: Doing a little better,  the swelling in LE is some improved. Started taking lasix and resumed Coumadin today. Pt accompanied by: self  PERTINENT  HISTORY: A-fib, CHF, hx of prostate CA, anemia  PAIN:  Are you having pain? No  PRECAUTIONS: None  RED FLAGS: None   WEIGHT BEARING RESTRICTIONS: No  FALLS: Has patient fallen in last 6 months? No  LIVING ENVIRONMENT: Lives with: lives with their daughter Lives in: House/apartment Stairs: Yes: Internal: flight steps; can reach both and External: 1 steps; grab bars Has following equipment at home: Walker - 2 wheeled  PLOF: Independent  PATIENT GOALS:   OBJECTIVE:   TODAY'S TREATMENT: 11/19/23 Activity Comments  Vitals: 123/76 mmHg, 78 bpm   Half Kneeling to stand 1x5 reps Seat surface for UE push-off  Treadmill training x 8 min Pre-walk 60-80 bpm -1 min warm-up at 1.2 mph -30 sec 3% -1 min level -30 sec 5% -1 min level -30 sec 7% -1 min level -30 sec 7% -1 min level Post walk: 89 bpm, 97%  Berg Balance Test 52/56  Single leg stance 3x10 sec Finger tips on counter  Sit-stand 2x5 reps 24" seat height for no UE support    Note: Objective measures were completed at Evaluation unless otherwise noted.  DIAGNOSTIC FINDINGS:   Vitals signs: 96%, 74 bpm, 122/78 mmHg  COGNITION: Overall cognitive status: Within functional limits for tasks assessed   SENSATION: WFL  COORDINATION: WNL  EDEMA:  Present BLE, wearing compression stockings  MUSCLE TONE: WNL  MUSCLE LENGTH: WFL    POSTURE: forward head  LOWER EXTREMITY ROM:     WFL LOWER EXTREMITY MMT:    4/5 gross BLE  BED MOBILITY:  indep  TRANSFERS: Assistive device utilized: None  Sit to stand: Modified independence Stand to sit: Complete Independence Chair to chair: Modified independence Floor:  NT--reports difficulty    STAIRS: NT  GAIT: Gait pattern: WFL Distance walked: 1,000 ft ( ) Assistive device utilized: None Level of assistance: Complete Independence Comments: 2.77 ft/sec  FUNCTIONAL TESTS:  5 times sit to stand: 27 sec BUE push-off needed Berg Balance Scale:  52/56  M-CTSIB  Condition 1: Firm Surface, EO  30 Sec, Normal Sway  Condition 2: Firm Surface, EC 30 Sec, Mild Sway  Condition 3: Foam Surface, EO  Sec,  Sway  Condition 4: Foam Surface, EC  Sec,  Sway    6 Minute Walk Test: 1,000 ft, rates RPE 5/10, 135/77 mmHg, 79 bpm, 98%     TODAY'S TREATMENT:                                                                                                                              DATE: 11/12/23    PATIENT EDUCATION: Education details: assessment details, HEP initiation, rationale of intervention Person educated: Patient and Child(ren) Education method: Explanation and Handouts Education comprehension: verbalized understanding  HOME EXERCISE PROGRAM: Access Code: [email or web address] URL: [email or web address] Date: 11/12/2023 Prepared by: Shary Decamp  Exercises - Sit to Stand with Arms Crossed  - 1 x daily - 7 x weekly - 3 sets - 5 reps - Standing to Half Kneel with Chair Support  - 1 x daily - 7 x weekly - 1 sets - 5 reps - Standing Single Leg Stance with Counter Support  - 1 x daily - 7 x weekly - 3 sets - 10 sec hold  GOALS: Goals reviewed with patient? Yes  SHORT TERM GOALS: Target date: same as LTG    LONG TERM GOALS: Target date: 12/10/2023    Patient will be independent in HEP to improve functional outcomes Baseline:  Goal status: INITIAL  2.  Demo improved gait speed/endurance by achieving distance of 1,250 ft during Baseline: 1,000 ft Goal status: INITIAL  3.  Manifest improved BLE strength and reduced risk for falls per time 15 sec 5xSTS test Baseline: 25 sec, dependent on BUE push-off Goal status: INITIAL  4.  Demo independent floor to stand transfer to improve functional mobility Baseline: reports difficulty, dependent on object for support Goal status: INITIAL    ASSESSMENT:  CLINICAL IMPRESSION: Returns to clinic and reports edema has been improving and notes less SOB/fatigue with walking  up incline driveway at home.  Review of techniques for half-kneeling to standing to use for HEP to improve strength and mobility for floor to stand transfer. Gait training on treadmill performing interval inclines to improve activity tolerance in home environment relying on UE support for managing incline.  Good tolerance overall and vitals monitored throughout without adverse reaction.  Berg Balance Test performance and demo low risk for falls per score 52/56 with chief difficulty in single limb stance; relevant HEP additions for floor to stand and single leg stance. Continued sessoins to progress POC details.   OBJECTIVE IMPAIRMENTS: cardiopulmonary status limiting activity, decreased activity tolerance, decreased mobility, difficulty walking, decreased strength, and increased edema.   ACTIVITY LIMITATIONS: carrying, transfers, and locomotion level  PARTICIPATION LIMITATIONS: community activity, yard work, and exercise routine  PERSONAL FACTORS: Age, Time since onset of injury/illness/exacerbation, and 3+ comorbidities: PMH  are also affecting patient's functional outcome.   REHAB POTENTIAL: Excellent  CLINICAL  DECISION MAKING: Evolving/moderate complexity  EVALUATION COMPLEXITY: Moderate  PLAN:  PT FREQUENCY: 1x/week  PT DURATION: 4 weeks  PLANNED INTERVENTIONS: 97110-Therapeutic exercises, 97530- Therapeutic activity, [id number]- Neuromuscular re-education, 97535- Self Care, 16109- Manual therapy, [phone]- Gait training, [phone]- Canalith repositioning, [phone]- Aquatic Therapy, Patient/Family education, Balance training, Stair training, and DME instructions  PLAN FOR NEXT SESSION: HEP review, floor to stand transfer and it's parts for HEP, treadmill incline intervals?   11:05 AM, 11/19/23 M. Shary Decamp, PT, DPT Physical Therapist-  Office Number: [phone]

## 2023-11-26 ENCOUNTER — Ambulatory Visit: Payer: Medicare Other

## 2023-11-26 ENCOUNTER — Ambulatory Visit: Payer: Medicare Other | Attending: Cardiovascular Disease

## 2023-11-26 DIAGNOSIS — M6281 Muscle weakness (generalized): Secondary | ICD-10-CM | POA: Diagnosis not present

## 2023-11-26 DIAGNOSIS — R2689 Other abnormalities of gait and mobility: Secondary | ICD-10-CM

## 2023-11-26 DIAGNOSIS — Z7901 Long term (current) use of anticoagulants: Secondary | ICD-10-CM | POA: Insufficient documentation

## 2023-11-26 DIAGNOSIS — I4821 Permanent atrial fibrillation: Secondary | ICD-10-CM | POA: Diagnosis not present

## 2023-11-26 LAB — POCT INR: INR: 1.2 — AB (ref 2.0–3.0)

## 2023-11-26 NOTE — Therapy (Signed)
OUTPATIENT PHYSICAL THERAPY NEURO TREATMENT   Patient Name: Daniel Love MRN: [id number] DOB:1940-12-22, 83 y.o., male Today's Date: 11/26/2023   PCP: Ernest Mallick, PA-C REFERRING PROVIDER: Vladimir Faster  END OF SESSION:  PT End of Session - 11/26/23 1054     Visit Number 3    Number of Visits 5    Date for PT Re-Evaluation 12/10/23    Authorization Type Medicare/MCD    Progress Note Due on Visit 10    PT Start Time 1100    PT Stop Time 1145    PT Time Calculation (min) 45 min             Past Medical History:  Diagnosis Date   Anal fissure    hx of    Arthritis    Atrial fibrillation (HCC)    maintaining normal sinus rhythm   Chronic anticoagulation    coumadin   Dyspepsia    Dysrhythmia    Afib   GERD (gastroesophageal reflux disease)    Glaucoma    History of colon polyps 2012   Colonoscopy-Dr. Elnoria Howard    History of echocardiogram    Echocardiogram 2/19: EF 60-65, normal wall motion, trivial AI, moderate to severe MR, mild LAE, mild RAE, mild to moderate TR, PASP 35   History of kidney stones    History of nuclear stress test    Myoview 2/19: EF 42, diffuse HK no significant ischemia, apical lateral scar   Hypertension    Prostate cancer (HCC)    Skin cancer    skin cancer removed from left arm and back   Past Surgical History:  Procedure Laterality Date   BACK SURGERY  12/31/1977   for degenerative disc disease   COLONOSCOPY     COLONOSCOPY WITH ESOPHAGOGASTRODUODENOSCOPY (EGD)     FLEXIBLE SIGMOIDOSCOPY N/A 04/25/2023   Procedure: FLEXIBLE SIGMOIDOSCOPY;  Surgeon: Iva Boop, MD;  Location: [facility];  Service: Gastroenterology;  Laterality: N/A;   HOT HEMOSTASIS N/A 04/25/2023   Procedure: HOT HEMOSTASIS (ARGON PLASMA COAGULATION/BICAP);  Surgeon: Iva Boop, MD;  Location: [facility];  Service: Gastroenterology;  Laterality: N/A;   IR RADIOLOGIST EVAL & MGMT  01/16/2022   IR RADIOLOGIST EVAL & MGMT  06/04/2022    IR RADIOLOGIST EVAL & MGMT  08/05/2023   PROSTATE BIOPSY     RADIOLOGY WITH ANESTHESIA N/A 02/21/2022   Procedure: CT WITH ANESTHESIA CRYOABLATION;  Surgeon: Irish Lack, MD;  Location: [facility];  Service: Radiology;  Laterality: N/A;   TEE WITHOUT CARDIOVERSION N/A 03/04/2019   Procedure: TRANSESOPHAGEAL ECHOCARDIOGRAM (TEE);  Surgeon: Elease Hashimoto Deloris Ping, MD;  Location: [facility];  Service: Cardiovascular;  Laterality: N/A;   toe removed     Right second toe   UPPER GASTROINTESTINAL ENDOSCOPY     Patient Active Problem List   Diagnosis Date Noted   Nonrheumatic aortic valve insufficiency 11/18/2023   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 04/15/2023   Thoracic aortic aneurysm (HCC) 01/16/2023   Chronic heart failure with preserved ejection fraction (HCC) 01/15/2023   Acquired thrombophilia (HCC) 12/12/2022   Mitral regurgitation and aortic stenosis 07/26/2022   Nonrheumatic tricuspid valve regurgitation 07/26/2022   Malignant neoplasm of prostate (HCC) 04/09/2018   Mitral valve insufficiency 02/26/2018   Benign hypertensive heart disease without heart failure 05/14/2013   Long term (current) use of anticoagulants 10/10/2011   Permanent atrial fibrillation (HCC)     ONSET DATE: 10/2023  REFERRING DIAG: R26.89 (ICD-10-CM) - Other abnormalities of  gait and mobility R29.898 (ICD-10-CM) - Other symptoms and signs involving the musculoskeletal system  THERAPY DIAG:  Muscle weakness (generalized)  Other abnormalities of gait and mobility  Rationale for Evaluation and Treatment: Rehabilitation  SUBJECTIVE:                                                                                                                                                                                             SUBJECTIVE STATEMENT: "Edema is much better, lost about 14 lbs in past 10 days. Able to walk to mailbox without needing rest breaks"  Pt accompanied by: self  PERTINENT  HISTORY: A-fib, CHF, hx of prostate CA, anemia  PAIN:  Are you having pain? No  PRECAUTIONS: None  RED FLAGS: None   WEIGHT BEARING RESTRICTIONS: No  FALLS: Has patient fallen in last 6 months? No  LIVING ENVIRONMENT: Lives with: lives with their daughter Lives in: House/apartment Stairs: Yes: Internal: flight steps; can reach both and External: 1 steps; grab bars Has following equipment at home: Walker - 2 wheeled  PLOF: Independent  PATIENT GOALS:   OBJECTIVE:   TODAY'S TREATMENT: 11/26/23 Activity Comments  Vitals: 119/81 mmHg, 65 bpm   Floor to stand Requiring UE support on rail  Walking lunge with counter support Cues for maintaining trunk extension and step length  Treadmill  2 min warmup at 2 mp 45 sec 5% incline 60 sec level. Requiring standing rest at 4 min. 45 sec 7% incline, 1 min flat. Seated rest period. Vitals 82-109 bpm 99%  Gait training/ambulation Level surfaces x 500 ft w/ cues for pace/speed  Sit to stand 3x5 reps UE push-off needed, elevated seat height  Single leg stance 3x10 sec UE support needed  Vitals: 124/83 mmHg, 73 bpm      TODAY'S TREATMENT: 11/19/23 Activity Comments  Vitals: 123/76 mmHg, 78 bpm   Half Kneeling to stand 1x5 reps Seat surface for UE push-off  Treadmill training x 8 min Pre-walk 60-80 bpm -1 min warm-up at 1.2 mph -30 sec 3% -1 min level -30 sec 5% -1 min level -30 sec 7% -1 min level -30 sec 7% -1 min level Post walk: 89 bpm, 97%  Berg Balance Test 52/56  Single leg stance 3x10 sec Finger tips on counter  Sit-stand 2x5 reps 24" seat height for no UE support    PATIENT EDUCATION: Education details: assessment details, HEP initiation, rationale of intervention Person educated: Patient and Child(ren) Education method: Explanation and Handouts Education comprehension: verbalized understanding  HOME EXERCISE PROGRAM: Access Code: [email or web address] URL: [email or web address] Date: 11/12/2023 Prepared by:  Shary Decamp  Exercises - Sit to Stand  with Arms Crossed  - 1 x daily - 7 x weekly - 3 sets - 5 reps - Standing to Half Kneel with Chair Support  - 1 x daily - 7 x weekly - 1 sets - 5 reps - Standing Single Leg Stance with Counter Support  - 1 x daily - 7 x weekly - 3 sets - 10 sec hold - Walking Forward Lunge  - 1 x daily - 7 x weekly - 1 sets - 10 reps  Note: Objective measures were completed at Evaluation unless otherwise noted.  DIAGNOSTIC FINDINGS:   Vitals signs: 96%, 74 bpm, 122/78 mmHg  COGNITION: Overall cognitive status: Within functional limits for tasks assessed   SENSATION: WFL  COORDINATION: WNL  EDEMA:  Present BLE, wearing compression stockings  MUSCLE TONE: WNL  MUSCLE LENGTH: WFL    POSTURE: forward head  LOWER EXTREMITY ROM:     WFL LOWER EXTREMITY MMT:    4/5 gross BLE  BED MOBILITY:  indep  TRANSFERS: Assistive device utilized: None  Sit to stand: Modified independence Stand to sit: Complete Independence Chair to chair: Modified independence Floor:  NT--reports difficulty    STAIRS: NT  GAIT: Gait pattern: WFL Distance walked: 1,000 ft ( ) Assistive device utilized: None Level of assistance: Complete Independence Comments: 2.77 ft/sec  FUNCTIONAL TESTS:  5 times sit to stand: 27 sec BUE push-off needed Berg Balance Scale: 52/56  M-CTSIB  Condition 1: Firm Surface, EO 30 Sec, Normal Sway  Condition 2: Firm Surface, EC 30 Sec, Mild Sway  Condition 3: Foam Surface, EO  Sec,  Sway  Condition 4: Foam Surface, EC  Sec,  Sway    6 Minute Walk Test: 1,000 ft, rates RPE 5/10, 135/77 mmHg, 79 bpm, 98%    GOALS: Goals reviewed with patient? Yes  SHORT TERM GOALS: Target date: same as LTG    LONG TERM GOALS: Target date: 12/10/2023    Patient will be independent in HEP to improve functional outcomes Baseline:  Goal status: IN PROGRESS  2.  Demo improved gait speed/endurance by achieving distance of 1,250 ft  during Baseline: 1,000 ft Goal status: IN PROGRESS  3.  Manifest improved BLE strength and reduced risk for falls per time 15 sec 5xSTS test Baseline: 25 sec, dependent on BUE push-off Goal status: IN PROGRESS  4.  Demo independent floor to stand transfer to improve functional mobility Baseline: reports difficulty, dependent on object for support Goal status: IN PROGRESS    ASSESSMENT:  CLINICAL IMPRESSION: Returns to clinic and reports edema has been improving and notes less SOB/fatigue with walking up incline driveway at home.  Review of strategy for floor to stand from quadruped requiring UE support to arise from half-kneeling to stand.  Implemented walking lunge for HEP using counter top support to progress LE strength to improve upon this.  Reduced ambulation tolerance in treadmill training with increased slope to 7% and 45 sec rounds increasing fatigue and requiring periodic rest periods, vitals monitored throughout and WNL.  Sit to stand from chair requiring UE push-off despite elevated seat height.  Single limb support requiring UE support for stability. Recommended resuming walking routine in driveway/neighborhood to improve endurance. Verbalizes understanding   OBJECTIVE IMPAIRMENTS: cardiopulmonary status limiting activity, decreased activity tolerance, decreased mobility, difficulty walking, decreased strength, and increased edema.   ACTIVITY LIMITATIONS: carrying, transfers, and locomotion level  PARTICIPATION LIMITATIONS: community activity, yard work, and exercise routine  PERSONAL FACTORS: Age, Time since onset of injury/illness/exacerbation, and 3+ comorbidities: PMH  are also affecting patient's functional outcome.   REHAB POTENTIAL: Excellent  CLINICAL DECISION MAKING: Evolving/moderate complexity  EVALUATION COMPLEXITY: Moderate  PLAN:  PT FREQUENCY: 1x/week  PT DURATION: 4 weeks  PLANNED INTERVENTIONS: 97110-Therapeutic exercises, 97530- Therapeutic  activity, [id number]- Neuromuscular re-education, 97535- Self Care, 16109- Manual therapy, [phone]- Gait training, [phone]- Canalith repositioning, [phone]- Aquatic Therapy, Patient/Family education, Balance training, Stair training, and DME instructions  PLAN FOR NEXT SESSION: HEP review, floor to stand transfer and it's parts for HEP, treadmill incline intervals?   10:54 AM, 11/26/23 M. Shary Decamp, PT, DPT Physical Therapist- [place] Office Number: [phone]

## 2023-11-26 NOTE — Telephone Encounter (Signed)
Eliquis restart addressed at 11/18/23 OV with Dr. Izora Ribas.  Please see encounter for more details.

## 2023-11-26 NOTE — Patient Instructions (Signed)
TAKE ANOTHER 1 TABLET TODAY AND TAKE 1.5 TABLETS WEDNESDAY THEN Continue taking Warfarin 1 tablet daily. Recheck INR 3 weeks.  Call us with any update or changes #[phone] or [phone].

## 2023-12-03 ENCOUNTER — Ambulatory Visit: Payer: Medicare Other | Attending: Physician Assistant

## 2023-12-03 DIAGNOSIS — R2689 Other abnormalities of gait and mobility: Secondary | ICD-10-CM | POA: Diagnosis not present

## 2023-12-03 DIAGNOSIS — M6281 Muscle weakness (generalized): Secondary | ICD-10-CM | POA: Diagnosis not present

## 2023-12-03 NOTE — Therapy (Signed)
OUTPATIENT PHYSICAL THERAPY NEURO TREATMENT   Patient Name: Daniel Love MRN: [id number] DOB:Mar 27, 1940, 83 y.o., male Today's Date: 12/03/2023   PCP: Ernest Mallick, PA-C REFERRING PROVIDER: Vladimir Faster  END OF SESSION:  PT End of Session - 12/03/23 1447     Visit Number 4    Number of Visits 5    Date for PT Re-Evaluation 12/10/23    Authorization Type Medicare/MCD    Progress Note Due on Visit 10    PT Start Time 1446    PT Stop Time 1530    PT Time Calculation (min) 44 min             Past Medical History:  Diagnosis Date   Anal fissure    hx of    Arthritis    Atrial fibrillation (HCC)    maintaining normal sinus rhythm   Chronic anticoagulation    coumadin   Dyspepsia    Dysrhythmia    Afib   GERD (gastroesophageal reflux disease)    Glaucoma    History of colon polyps 2012   Colonoscopy-Dr. Elnoria Howard    History of echocardiogram    Echocardiogram 2/19: EF 60-65, normal wall motion, trivial AI, moderate to severe MR, mild LAE, mild RAE, mild to moderate TR, PASP 35   History of kidney stones    History of nuclear stress test    Myoview 2/19: EF 42, diffuse HK no significant ischemia, apical lateral scar   Hypertension    Prostate cancer (HCC)    Skin cancer    skin cancer removed from left arm and back   Past Surgical History:  Procedure Laterality Date   BACK SURGERY  12/31/1977   for degenerative disc disease   COLONOSCOPY     COLONOSCOPY WITH ESOPHAGOGASTRODUODENOSCOPY (EGD)     FLEXIBLE SIGMOIDOSCOPY N/A 04/25/2023   Procedure: FLEXIBLE SIGMOIDOSCOPY;  Surgeon: Iva Boop, MD;  Location: [facility];  Service: Gastroenterology;  Laterality: N/A;   HOT HEMOSTASIS N/A 04/25/2023   Procedure: HOT HEMOSTASIS (ARGON PLASMA COAGULATION/BICAP);  Surgeon: Iva Boop, MD;  Location: [facility];  Service: Gastroenterology;  Laterality: N/A;   IR RADIOLOGIST EVAL & MGMT  01/16/2022   IR RADIOLOGIST EVAL & MGMT  06/04/2022    IR RADIOLOGIST EVAL & MGMT  08/05/2023   PROSTATE BIOPSY     RADIOLOGY WITH ANESTHESIA N/A 02/21/2022   Procedure: CT WITH ANESTHESIA CRYOABLATION;  Surgeon: Irish Lack, MD;  Location: [facility];  Service: Radiology;  Laterality: N/A;   TEE WITHOUT CARDIOVERSION N/A 03/04/2019   Procedure: TRANSESOPHAGEAL ECHOCARDIOGRAM (TEE);  Surgeon: Elease Hashimoto Deloris Ping, MD;  Location: [facility];  Service: Cardiovascular;  Laterality: N/A;   toe removed     Right second toe   UPPER GASTROINTESTINAL ENDOSCOPY     Patient Active Problem List   Diagnosis Date Noted   Nonrheumatic aortic valve insufficiency 11/18/2023   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 04/15/2023   Thoracic aortic aneurysm (HCC) 01/16/2023   Chronic heart failure with preserved ejection fraction (HCC) 01/15/2023   Acquired thrombophilia (HCC) 12/12/2022   Mitral regurgitation and aortic stenosis 07/26/2022   Nonrheumatic tricuspid valve regurgitation 07/26/2022   Malignant neoplasm of prostate (HCC) 04/09/2018   Mitral valve insufficiency 02/26/2018   Benign hypertensive heart disease without heart failure 05/14/2013   Long term (current) use of anticoagulants 10/10/2011   Permanent atrial fibrillation (HCC)     ONSET DATE: 10/2023  REFERRING DIAG: R26.89 (ICD-10-CM) - Other abnormalities of  gait and mobility R29.898 (ICD-10-CM) - Other symptoms and signs involving the musculoskeletal system  THERAPY DIAG:  Muscle weakness (generalized)  Other abnormalities of gait and mobility  Rationale for Evaluation and Treatment: Rehabilitation  SUBJECTIVE:                                                                                                                                                                                             SUBJECTIVE STATEMENT: Edema has much improved . Feeling better overall  Pt accompanied by: self  PERTINENT HISTORY: A-fib, CHF, hx of prostate CA, anemia  PAIN:  Are you  having pain? No  PRECAUTIONS: None  RED FLAGS: None   WEIGHT BEARING RESTRICTIONS: No  FALLS: Has patient fallen in last 6 months? No  LIVING ENVIRONMENT: Lives with: lives with their daughter Lives in: House/apartment Stairs: Yes: Internal: flight steps; can reach both and External: 1 steps; grab bars Has following equipment at home: Walker - 2 wheeled  PLOF: Independent  PATIENT GOALS:   OBJECTIVE:   TODAY'S TREATMENT: 12/03/23 Activity Comments  Vitals: 125/82 mmHg, 74 bpm   Treadmill intervals x 8 min 30 sec progressive incline at 1.8 mph. Reduced to flat between bouts  Vitals: 138/76 mmHg, 76 bpm, 97%   Walking lunges single UE support 3x5 reps   Step up with knee drive 2x5 BUE support  Multi-sensory balance activities To facilitate righting reactions         PATIENT EDUCATION: Education details: assessment details, HEP initiation, rationale of intervention Person educated: Patient and Child(ren) Education method: Explanation and Handouts Education comprehension: verbalized understanding  HOME EXERCISE PROGRAM: Access Code: [email or web address] URL: [email or web address] Date: 11/12/2023 Prepared by: Shary Decamp  Exercises - Sit to Stand with Arms Crossed  - 1 x daily - 7 x weekly - 3 sets - 5 reps - Standing to Half Kneel with Chair Support  - 1 x daily - 7 x weekly - 1 sets - 5 reps - Standing Single Leg Stance with Counter Support  - 1 x daily - 7 x weekly - 3 sets - 10 sec hold - Walking Forward Lunge  - 1 x daily - 7 x weekly - 1 sets - 10 reps  Note: Objective measures were completed at Evaluation unless otherwise noted.  DIAGNOSTIC FINDINGS:   Vitals signs: 96%, 74 bpm, 122/78 mmHg  COGNITION: Overall cognitive status: Within functional limits for tasks assessed   SENSATION: WFL  COORDINATION: WNL  EDEMA:  Present BLE, wearing compression stockings  MUSCLE TONE: WNL  MUSCLE LENGTH: WFL    POSTURE: forward head  LOWER  EXTREMITY ROM:  WFL LOWER EXTREMITY MMT:    4/5 gross BLE  BED MOBILITY:  indep  TRANSFERS: Assistive device utilized: None  Sit to stand: Modified independence Stand to sit: Complete Independence Chair to chair: Modified independence Floor:  NT--reports difficulty    STAIRS: NT  GAIT: Gait pattern: WFL Distance walked: 1,000 ft ( ) Assistive device utilized: None Level of assistance: Complete Independence Comments: 2.77 ft/sec  FUNCTIONAL TESTS:  5 times sit to stand: 27 sec BUE push-off needed Berg Balance Scale: 52/56  M-CTSIB  Condition 1: Firm Surface, EO 30 Sec, Normal Sway  Condition 2: Firm Surface, EC 30 Sec, Mild Sway  Condition 3: Foam Surface, EO  Sec,  Sway  Condition 4: Foam Surface, EC  Sec,  Sway    6 Minute Walk Test: 1,000 ft, rates RPE 5/10, 135/77 mmHg, 79 bpm, 98%    GOALS: Goals reviewed with patient? Yes  SHORT TERM GOALS: Target date: same as LTG    LONG TERM GOALS: Target date: 12/10/2023    Patient will be independent in HEP to improve functional outcomes Baseline:  Goal status: IN PROGRESS  2.  Demo improved gait speed/endurance by achieving distance of 1,250 ft during Baseline: 1,000 ft Goal status: IN PROGRESS  3.  Manifest improved BLE strength and reduced risk for falls per time 15 sec 5xSTS test Baseline: 25 sec, dependent on BUE push-off Goal status: IN PROGRESS  4.  Demo independent floor to stand transfer to improve functional mobility Baseline: reports difficulty, dependent on object for support Goal status: IN PROGRESS    ASSESSMENT:  CLINICAL IMPRESSION: Vitals WNL and notes overall improvement since reduction in edema.  Proceeded with treadmill intervals to improve tolerance for faster pace and inclines with good tolerance exhibited throughout no SOB noted and vitals post-session WNL.  Continued with LE strengthening to improve ability to lowering and arising from floor with excellent return  demonstration for form and sequence. Ended session with multi-sensory balance activities to facilitate righting reactions with good tolerance and ability to self-correct before LOB  OBJECTIVE IMPAIRMENTS: cardiopulmonary status limiting activity, decreased activity tolerance, decreased mobility, difficulty walking, decreased strength, and increased edema.   ACTIVITY LIMITATIONS: carrying, transfers, and locomotion level  PARTICIPATION LIMITATIONS: community activity, yard work, and exercise routine  PERSONAL FACTORS: Age, Time since onset of injury/illness/exacerbation, and 3+ comorbidities: PMH  are also affecting patient's functional outcome.   REHAB POTENTIAL: Excellent  CLINICAL DECISION MAKING: Evolving/moderate complexity  EVALUATION COMPLEXITY: Moderate  PLAN:  PT FREQUENCY: 1x/week  PT DURATION: 4 weeks  PLANNED INTERVENTIONS: 97110-Therapeutic exercises, 97530- Therapeutic activity, [id number]- Neuromuscular re-education, 97535- Self Care, [id number]- Manual therapy, [phone]- Gait training, [phone]- Canalith repositioning, [phone]- Aquatic Therapy, Patient/Family education, Balance training, Stair training, and DME instructions  PLAN FOR NEXT SESSION: HEP review, and D/C assessment   2:48 PM, 12/03/23 M. Shary Decamp, PT, DPT Physical Therapist- [place] Office Number: [phone]

## 2023-12-04 NOTE — Progress Notes (Signed)
Unable to reach patient about procedure, but was able to leave a detailed message. Stated that the patient needed to arrive at the hospital at 1015 , remain NPO after 0000, needs to have a ride home and a responsible adult to stay with them for 24 hours after the procedure. Instructed the patient to call back if they had any questions.

## 2023-12-05 ENCOUNTER — Encounter (HOSPITAL_COMMUNITY): Payer: Self-pay | Admitting: Internal Medicine

## 2023-12-05 ENCOUNTER — Encounter (HOSPITAL_COMMUNITY)
Admission: RE | Disposition: A | Discharge status: Home / Self Care | Payer: Self-pay | Source: Home / Self Care | Attending: Internal Medicine

## 2023-12-05 ENCOUNTER — Other Ambulatory Visit: Payer: Self-pay

## 2023-12-05 ENCOUNTER — Ambulatory Visit (HOSPITAL_COMMUNITY): Payer: Medicare Other | Admitting: Certified Registered"

## 2023-12-05 ENCOUNTER — Ambulatory Visit (HOSPITAL_COMMUNITY): Payer: Medicare Other

## 2023-12-05 ENCOUNTER — Ambulatory Visit (HOSPITAL_COMMUNITY)
Admission: RE | Admit: 2023-12-05 | Discharge: 2023-12-05 | Disposition: A | Discharge status: Home / Self Care | Payer: Medicare Other | Attending: Internal Medicine | Admitting: Internal Medicine

## 2023-12-05 DIAGNOSIS — I130 Hypertensive heart and chronic kidney disease with heart failure and stage 1 through stage 4 chronic kidney disease, or unspecified chronic kidney disease: Secondary | ICD-10-CM | POA: Diagnosis not present

## 2023-12-05 DIAGNOSIS — I110 Hypertensive heart disease with heart failure: Secondary | ICD-10-CM | POA: Diagnosis not present

## 2023-12-05 DIAGNOSIS — K219 Gastro-esophageal reflux disease without esophagitis: Secondary | ICD-10-CM | POA: Insufficient documentation

## 2023-12-05 DIAGNOSIS — I083 Combined rheumatic disorders of mitral, aortic and tricuspid valves: Secondary | ICD-10-CM | POA: Diagnosis not present

## 2023-12-05 DIAGNOSIS — Z7901 Long term (current) use of anticoagulants: Secondary | ICD-10-CM | POA: Diagnosis not present

## 2023-12-05 DIAGNOSIS — F419 Anxiety disorder, unspecified: Secondary | ICD-10-CM | POA: Insufficient documentation

## 2023-12-05 DIAGNOSIS — I351 Nonrheumatic aortic (valve) insufficiency: Secondary | ICD-10-CM

## 2023-12-05 DIAGNOSIS — Z79899 Other long term (current) drug therapy: Secondary | ICD-10-CM | POA: Diagnosis not present

## 2023-12-05 DIAGNOSIS — I4891 Unspecified atrial fibrillation: Secondary | ICD-10-CM | POA: Diagnosis not present

## 2023-12-05 DIAGNOSIS — N184 Chronic kidney disease, stage 4 (severe): Secondary | ICD-10-CM | POA: Diagnosis not present

## 2023-12-05 DIAGNOSIS — Z87891 Personal history of nicotine dependence: Secondary | ICD-10-CM | POA: Diagnosis not present

## 2023-12-05 DIAGNOSIS — I4821 Permanent atrial fibrillation: Secondary | ICD-10-CM | POA: Diagnosis not present

## 2023-12-05 DIAGNOSIS — D509 Iron deficiency anemia, unspecified: Secondary | ICD-10-CM | POA: Diagnosis not present

## 2023-12-05 DIAGNOSIS — I340 Nonrheumatic mitral (valve) insufficiency: Secondary | ICD-10-CM

## 2023-12-05 DIAGNOSIS — I509 Heart failure, unspecified: Secondary | ICD-10-CM | POA: Diagnosis not present

## 2023-12-05 HISTORY — PX: TRANSESOPHAGEAL ECHOCARDIOGRAM (CATH LAB): EP1270

## 2023-12-05 LAB — ECHO TEE

## 2023-12-05 SURGERY — TRANSESOPHAGEAL ECHOCARDIOGRAM (TEE) (CATHLAB)
Anesthesia: Monitor Anesthesia Care

## 2023-12-05 MED ORDER — SODIUM CHLORIDE 0.9% FLUSH
10.0000 mL | Freq: Two times a day (BID) | INTRAVENOUS | Status: DC
Start: 2023-12-05 — End: 2023-12-05

## 2023-12-05 MED ORDER — PHENYLEPHRINE 80 MCG/ML (10ML) SYRINGE FOR IV PUSH (FOR BLOOD PRESSURE SUPPORT)
PREFILLED_SYRINGE | INTRAVENOUS | Status: DC | PRN
  Administered 2023-12-05: 80 ug via INTRAVENOUS
  Administered 2023-12-05: 160 ug via INTRAVENOUS
  Administered 2023-12-05: 80 ug via INTRAVENOUS

## 2023-12-05 MED ORDER — LIDOCAINE 2% (20 MG/ML) 5 ML SYRINGE
INTRAMUSCULAR | Status: DC | PRN
  Administered 2023-12-05: 40 mg via INTRAVENOUS

## 2023-12-05 MED ORDER — SODIUM CHLORIDE 0.9 % IV SOLN: INTRAVENOUS | Status: DC | PRN

## 2023-12-05 MED ORDER — PROPOFOL 10 MG/ML IV BOLUS
INTRAVENOUS | Status: DC | PRN
  Administered 2023-12-05: 50 mg via INTRAVENOUS

## 2023-12-05 MED ORDER — PROPOFOL 500 MG/50ML IV EMUL
INTRAVENOUS | Status: DC | PRN
  Administered 2023-12-05: 50 ug/kg/min via INTRAVENOUS

## 2023-12-05 NOTE — Transfer of Care (Signed)
Immediate Anesthesia Transfer of Care Note  Patient: Daniel Love  Procedure(s) Performed: TRANSESOPHAGEAL ECHOCARDIOGRAM  Patient Location: PACU and Cath Lab  Anesthesia Type:MAC  Level of Consciousness: awake, alert , and oriented  Airway & Oxygen Therapy: Patient Spontanous Breathing  Post-op Assessment: Report given to RN  Post vital signs: Reviewed and stable  Last Vitals:  Vitals Value Taken Time  BP    Temp    Pulse    Resp    SpO2      Last Pain:  Vitals:   12/05/23 1035  TempSrc:   PainSc: 0-No pain         Complications: No notable events documented.

## 2023-12-05 NOTE — Discharge Instructions (Signed)
TEE   YOU HAD AN CARDIAC PROCEDURE TODAY: Refer to the procedure report and other information in the discharge instructions given to you for any specific questions about what was found during the examination. If this information does not answer your questions, please call Dr. Ganji's office at [phone] to clarify.   DIET: Your first meal following the procedure should be a light meal and then it is ok to progress to your normal diet. A half-sandwich or bowl of soup is an example of a good first meal. Heavy or fried foods are harder to digest and may make you feel nauseous or bloated. Drink plenty of fluids but you should avoid alcoholic beverages for 24 hours. If you had a esophageal dilation, please see attached instructions for diet.   ACTIVITY: Your care partner should take you home directly after the procedure. You should plan to take it easy, moving slowly for the rest of the day. You can resume normal activity the day after the procedure however YOU SHOULD NOT DRIVE, use power tools, machinery or perform tasks that involve climbing or major physical exertion for 24 hours (because of the sedation medicines used during the test).   SYMPTOMS TO REPORT IMMEDIATELY: A cardiologist can be reached at any hour. Please call [phone] for any of the following symptoms:  Vomiting of blood or coffee ground material  New, significant abdominal pain  New, significant chest pain or pain under the shoulder blades  Painful or persistently difficult swallowing  New shortness of breath  Black, tarry-looking or red, bloody stools  FOLLOW UP:  Please also call with any specific questions about appointments or follow up tests. 

## 2023-12-05 NOTE — Interval H&P Note (Signed)
History and Physical Interval Note:  12/05/2023 11:52 AM  Daniel Love  has presented today for surgery, with the diagnosis of AORTIC REGURGITATION.  The various methods of treatment have been discussed with the patient and family. After consideration of risks, benefits and other options for treatment, the patient has consented to  Procedure(s): TRANSESOPHAGEAL ECHOCARDIOGRAM (N/A) as a surgical intervention.  The patient's history has been reviewed, patient examined, no change in status, stable for surgery.  I have reviewed the patient's chart and labs.  Questions were answered to the patient's satisfaction.     Daniel Love A Johnnell Liou

## 2023-12-05 NOTE — Anesthesia Preprocedure Evaluation (Signed)
Anesthesia Evaluation  Patient identified by MRN, date of birth, ID band Patient awake    Reviewed: Allergy & Precautions, H&P , NPO status , Patient's Chart, lab work & pertinent test results  Airway Mallampati: II  TM Distance: >3 FB Neck ROM: Full    Dental no notable dental hx. (+) Chipped, Dental Advisory Given   Pulmonary neg pulmonary ROS, former smoker   Pulmonary exam normal breath sounds clear to auscultation       Cardiovascular hypertension, Pt. on medications Normal cardiovascular exam+ dysrhythmias Atrial Fibrillation  Rhythm:Regular Rate:Normal  10/2022 TTE  1. Left ventricular ejection fraction, by estimation, is 60 to 65%. The  left ventricle has normal function. The left ventricle has no regional  wall motion abnormalities. There is mild left ventricular hypertrophy.  Left ventricular diastolic parameters  are indeterminate.   2. Right ventricular systolic function is normal. The right ventricular  size is moderately enlarged. There is mildly elevated pulmonary artery  systolic pressure. The estimated right ventricular systolic pressure is  38.0 mmHg.   3. Left atrial size was severely dilated.   4. Right atrial size was severely dilated.   5. Moderate to severe mitral valve regurgitation.   6. Tricuspid valve regurgitation is moderate.   7. The aortic valve is tricuspid. Aortic valve regurgitation is mild.   8. 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   9. The inferior vena cava is normal in size with <50% respiratory  variability, suggesting right atrial pressure of 8 mmHg.     Neuro/Psych negative neurological ROS  negative psych ROS   GI/Hepatic Neg liver ROS,GERD  ,,  Endo/Other  negative endocrine ROS    Renal/GU negative Renal ROS   Prostate CA negative genitourinary   Musculoskeletal   Abdominal Normal abdominal exam  (+)    Peds negative pediatric ROS (+)  Hematology negative hematology ROS (+)   Anesthesia Other Findings All: lisinopril, amlodipine  Reproductive/Obstetrics negative OB ROS                             Anesthesia Physical Anesthesia Plan  ASA: 3  Anesthesia Plan: MAC   Post-op Pain Management:    Induction: Intravenous  PONV Risk Score and Plan: Treatment may vary due to age or medical condition and Propofol infusion  Airway Management Planned: Natural Airway and Mask  Additional Equipment: None  Intra-op Plan:   Post-operative Plan:   Informed Consent: I have reviewed the patients History and Physical, chart, labs and discussed the procedure including the risks, benefits and alternatives for the proposed anesthesia with the patient or authorized representative who has indicated his/her understanding and acceptance.     Dental advisory given  Plan Discussed with: CRNA  Anesthesia Plan Comments:        Anesthesia Quick Evaluation

## 2023-12-05 NOTE — CV Procedure (Signed)
    TRANSESOPHAGEAL ECHOCARDIOGRAM   NAME:  Daniel Love    MRN: [id number] DOB:  Oct 12, 1940    ADMIT DATE: 12/05/2023  INDICATIONS: MR/TR  PROCEDURE:   Informed consent was obtained prior to the procedure. The risks, benefits and alternatives for the procedure were discussed and the patient comprehended these risks.  Risks include, but are not limited to, cough, sore throat, vomiting, nausea, somnolence, esophageal and stomach trauma or perforation, bleeding, low blood pressure, aspiration, pneumonia, infection, trauma to the teeth and death.    Procedural time out performed. The oropharynx was anesthetized with topical 1% benzocaine.    Anesthesia was administered by Dr. Arby Barrette and team.   The patient's heart rate, blood pressure, and oxygen saturation are monitored continuously during the procedure.  The transesophageal probe was inserted in the esophagus and stomach without difficulty and multiple views were obtained.   COMPLICATIONS:    There were no immediate complications.  KEY FINDINGS:  Patent left atrial appendage Moderate to severe mitral and tricuspid regurgitation Full report to follow. Further management per primary team.   Daniel Lam, MD [place]  [facility]  1:49 PM

## 2023-12-06 ENCOUNTER — Encounter (HOSPITAL_COMMUNITY): Payer: Self-pay | Admitting: Internal Medicine

## 2023-12-10 ENCOUNTER — Ambulatory Visit: Payer: Medicare Other

## 2023-12-10 DIAGNOSIS — R2689 Other abnormalities of gait and mobility: Secondary | ICD-10-CM | POA: Diagnosis not present

## 2023-12-10 DIAGNOSIS — M6281 Muscle weakness (generalized): Secondary | ICD-10-CM | POA: Diagnosis not present

## 2023-12-10 NOTE — Anesthesia Postprocedure Evaluation (Addendum)
Anesthesia Post Note  Patient: Daniel Love  Procedure(s) Performed: TRANSESOPHAGEAL ECHOCARDIOGRAM     Patient location during evaluation: PACU Anesthesia Type: MAC Level of consciousness: awake Pain management: pain level controlled Vital Signs Assessment: post-procedure vital signs reviewed and stable Respiratory status: spontaneous breathing Cardiovascular status: stable Postop Assessment: no apparent nausea or vomiting Anesthetic complications: no  No notable events documented.  Last Vitals:  Vitals:   12/05/23 1330 12/05/23 1345  BP: 117/74 121/80  Pulse: 60 63  Resp: 15 17  Temp:    SpO2: 98% 96%    Last Pain:  Vitals:   12/05/23 1240  TempSrc: Temporal  PainSc: 0-No pain                 Caren Macadam

## 2023-12-10 NOTE — Therapy (Signed)
OUTPATIENT PHYSICAL THERAPY NEURO TREATMENT and D/C Summary   Patient Name: Daniel Love MRN: [id number] DOB:1940/02/28, 83 y.o., male Today's Date: 12/10/2023   PCP: Vladimir Faster REFERRING PROVIDER: Ernest Mallick, PA-C  PHYSICAL THERAPY DISCHARGE SUMMARY  Visits from Start of Care: 5  Current functional level related to goals / functional outcomes: Goals partially met. Improved symptoms overall, e.g. reduced peripheral edema, no SOB with activity. Reports return to typical walking ability   Remaining deficits: Decreased strength/ability for floor to stand transfers requiring UE support on furniture   Education / Equipment: HEP, SilverSneakers info   Patient agrees to discharge. Patient goals were partially met. Patient is being discharged due to meeting the stated rehab goals.   END OF SESSION:  PT End of Session - 12/10/23 1059     Visit Number 5    Number of Visits 5    Date for PT Re-Evaluation 12/10/23    Authorization Type Medicare/MCD    Progress Note Due on Visit 10    PT Start Time 1100    PT Stop Time 1145    PT Time Calculation (min) 45 min             Past Medical History:  Diagnosis Date   Anal fissure    hx of    Arthritis    Atrial fibrillation (HCC)    maintaining normal sinus rhythm   Chronic anticoagulation    coumadin   Dyspepsia    Dysrhythmia    Afib   GERD (gastroesophageal reflux disease)    Glaucoma    History of colon polyps 2012   Colonoscopy-Dr. Elnoria Howard    History of echocardiogram    Echocardiogram 2/19: EF 60-65, normal wall motion, trivial AI, moderate to severe MR, mild LAE, mild RAE, mild to moderate TR, PASP 35   History of kidney stones    History of nuclear stress test    Myoview 2/19: EF 42, diffuse HK no significant ischemia, apical lateral scar   Hypertension    Prostate cancer (HCC)    Skin cancer    skin cancer removed from left arm and back   Past Surgical History:  Procedure  Laterality Date   BACK SURGERY  12/31/1977   for degenerative disc disease   COLONOSCOPY     COLONOSCOPY WITH ESOPHAGOGASTRODUODENOSCOPY (EGD)     FLEXIBLE SIGMOIDOSCOPY N/A 04/25/2023   Procedure: FLEXIBLE SIGMOIDOSCOPY;  Surgeon: Iva Boop, MD;  Location: [facility];  Service: Gastroenterology;  Laterality: N/A;   HOT HEMOSTASIS N/A 04/25/2023   Procedure: HOT HEMOSTASIS (ARGON PLASMA COAGULATION/BICAP);  Surgeon: Iva Boop, MD;  Location: [facility];  Service: Gastroenterology;  Laterality: N/A;   IR RADIOLOGIST EVAL & MGMT  01/16/2022   IR RADIOLOGIST EVAL & MGMT  06/04/2022   IR RADIOLOGIST EVAL & MGMT  08/05/2023   PROSTATE BIOPSY     RADIOLOGY WITH ANESTHESIA N/A 02/21/2022   Procedure: CT WITH ANESTHESIA CRYOABLATION;  Surgeon: Irish Lack, MD;  Location: [facility];  Service: Radiology;  Laterality: N/A;   TEE WITHOUT CARDIOVERSION N/A 03/04/2019   Procedure: TRANSESOPHAGEAL ECHOCARDIOGRAM (TEE);  Surgeon: Vesta Mixer, MD;  Location: [facility];  Service: Cardiovascular;  Laterality: N/A;   toe removed     Right second toe   TRANSESOPHAGEAL ECHOCARDIOGRAM (CATH LAB) N/A 12/05/2023   Procedure: TRANSESOPHAGEAL ECHOCARDIOGRAM;  Surgeon: Christell Constant, MD;  Location: [facility];  Service: Cardiovascular;  Laterality: N/A;   UPPER GASTROINTESTINAL ENDOSCOPY  Patient Active Problem List   Diagnosis Date Noted   Nonrheumatic aortic valve insufficiency 11/18/2023   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 04/15/2023   Thoracic aortic aneurysm (HCC) 01/16/2023   Chronic heart failure with preserved ejection fraction (HCC) 01/15/2023   Acquired thrombophilia (HCC) 12/12/2022   Mitral regurgitation and aortic stenosis 07/26/2022   Nonrheumatic tricuspid valve regurgitation 07/26/2022   Malignant neoplasm of prostate (HCC) 04/09/2018   Mitral valve insufficiency 02/26/2018   Benign hypertensive heart disease without heart failure  05/14/2013   Long term (current) use of anticoagulants 10/10/2011   Permanent atrial fibrillation (HCC)     ONSET DATE: 10/2023  REFERRING DIAG: R26.89 (ICD-10-CM) - Other abnormalities of gait and mobility R29.898 (ICD-10-CM) - Other symptoms and signs involving the musculoskeletal system  THERAPY DIAG:  Muscle weakness (generalized)  Other abnormalities of gait and mobility  Rationale for Evaluation and Treatment: Rehabilitation  SUBJECTIVE:                                                                                                                                                                                             SUBJECTIVE STATEMENT: Edema has much improved . Feeling better overall. Able to walk without issue, no SOB with walking to/from mailbox  Pt accompanied by: self  PERTINENT HISTORY: A-fib, CHF, hx of prostate CA, anemia  PAIN:  Are you having pain? No  PRECAUTIONS: None  RED FLAGS: None   WEIGHT BEARING RESTRICTIONS: No  FALLS: Has patient fallen in last 6 months? No  LIVING ENVIRONMENT: Lives with: lives with their daughter Lives in: House/apartment Stairs: Yes: Internal: flight steps; can reach both and External: 1 steps; grab bars Has following equipment at home: Walker - 2 wheeled  PLOF: Independent  PATIENT GOALS:   OBJECTIVE:   TODAY'S TREATMENT: 12/10/23 Activity Comments  Vitals: 97%, 74 bpm, 123/79 mmHg   5xSTS 19 sec  1,150 ft, no symptoms  Vitals: 132/91 mmHg, 98%, 75 bpm   Floor to stand transfers Modified indep, requires UE support  HEP review for functional mobility and strengt      TODAY'S TREATMENT: 12/03/23 Activity Comments  Vitals: 125/82 mmHg, 74 bpm   Treadmill intervals x 8 min 30 sec progressive incline at 1.8 mph. Reduced to flat between bouts  Vitals: 138/76 mmHg, 76 bpm, 97%   Walking lunges single UE support 3x5 reps   Step up with knee drive 2x5 BUE support  Multi-sensory balance activities To  facilitate righting reactions         PATIENT EDUCATION: Education details: assessment details, HEP initiation, rationale of intervention Person educated: Patient  and Child(ren) Education method: Explanation and Handouts Education comprehension: verbalized understanding  HOME EXERCISE PROGRAM: Access Code: [email or web address] URL: [email or web address] Date: 11/12/2023 Prepared by: Shary Decamp  Exercises - Sit to Stand with Arms Crossed  - 1 x daily - 7 x weekly - 3 sets - 5 reps - Standing to Half Kneel with Chair Support  - 1 x daily - 7 x weekly - 1 sets - 5 reps - Standing Single Leg Stance with Counter Support  - 1 x daily - 7 x weekly - 3 sets - 10 sec hold - Walking Forward Lunge  - 1 x daily - 7 x weekly - 1 sets - 10 reps  Note: Objective measures were completed at Evaluation unless otherwise noted.  DIAGNOSTIC FINDINGS:   Vitals signs: 96%, 74 bpm, 122/78 mmHg  COGNITION: Overall cognitive status: Within functional limits for tasks assessed   SENSATION: WFL  COORDINATION: WNL  EDEMA:  Present BLE, wearing compression stockings  MUSCLE TONE: WNL  MUSCLE LENGTH: WFL    POSTURE: forward head  LOWER EXTREMITY ROM:     WFL LOWER EXTREMITY MMT:    4/5 gross BLE  BED MOBILITY:  indep  TRANSFERS: Assistive device utilized: None  Sit to stand: Modified independence Stand to sit: Complete Independence Chair to chair: Modified independence Floor:  NT--reports difficulty    STAIRS: NT  GAIT: Gait pattern: WFL Distance walked: 1,000 ft ( ) Assistive device utilized: None Level of assistance: Complete Independence Comments: 2.77 ft/sec  FUNCTIONAL TESTS:  5 times sit to stand: 27 sec BUE push-off needed Berg Balance Scale: 52/56  M-CTSIB  Condition 1: Firm Surface, EO 30 Sec, Normal Sway  Condition 2: Firm Surface, EC 30 Sec, Mild Sway  Condition 3: Foam Surface, EO  Sec,  Sway  Condition 4: Foam Surface, EC  Sec,  Sway    6  Minute Walk Test: 1,000 ft, rates RPE 5/10, 135/77 mmHg, 79 bpm, 98%    GOALS: Goals reviewed with patient? Yes  SHORT TERM GOALS: Target date: same as LTG    LONG TERM GOALS: Target date: 12/10/2023    Patient will be independent in HEP to improve functional outcomes Baseline:  Goal status: MET  2.  Demo improved gait speed/endurance by achieving distance of 1,250 ft during Baseline: 1,000 ft; (12/10/23) 1,150 ft Goal status: NOT MET  3.  Manifest improved BLE strength and reduced risk for falls per time 15 sec 5xSTS test Baseline: 25 sec, dependent on BUE push-off; (12/10/23) 19 sec, no UE use Goal status: NOT MET  4.  Demo independent floor to stand transfer to improve functional mobility Baseline: reports difficulty, dependent on object for support; modified indep, requiring furniture for UE support Goal status: NOT MET    ASSESSMENT:  CLINICAL IMPRESSION: POC review with ability to perform 5xSTS without use of UE and improved time to 19 sec indicating greater LE strength/power and balance.  Able to increase distance from initial 1,000 ft to 1,150 ft without any SOB and vital signs WNL.  Floor to stand transfer completed with ability to use UE support on furniture for modified independent floor to stand.  Demo independence with HEP recall. No further issues noted/reported.  Will D/C to HEP and recommend participation of SilverSneakers for group exercise/balance.   OBJECTIVE IMPAIRMENTS: cardiopulmonary status limiting activity, decreased activity tolerance, decreased mobility, difficulty walking, decreased strength, and increased edema.   ACTIVITY LIMITATIONS: carrying, transfers, and locomotion level  PARTICIPATION LIMITATIONS: community activity, yard work, and  exercise routine  PERSONAL FACTORS: Age, Time since onset of injury/illness/exacerbation, and 3+ comorbidities: PMH  are also affecting patient's functional outcome.   REHAB POTENTIAL:  Excellent  CLINICAL DECISION MAKING: Evolving/moderate complexity  EVALUATION COMPLEXITY: Moderate  PLAN:  PT FREQUENCY: 1x/week  PT DURATION: 4 weeks  PLANNED INTERVENTIONS: 97110-Therapeutic exercises, 97530- Therapeutic activity, [id number]- Neuromuscular re-education, 97535- Self Care, [id number]- Manual therapy, [id number]- Gait training, [phone]- Canalith repositioning, [id number]- Aquatic Therapy, Patient/Family education, Balance training, Stair training, and DME instructions  PLAN FOR NEXT SESSION:  D/C assessment   11:00 AM, 12/10/23 M. Shary Decamp, PT, DPT Physical Therapist- [place] Office Number: [phone]

## 2023-12-11 DIAGNOSIS — L03011 Cellulitis of right finger: Secondary | ICD-10-CM | POA: Diagnosis not present

## 2023-12-11 DIAGNOSIS — I10 Essential (primary) hypertension: Secondary | ICD-10-CM | POA: Diagnosis not present

## 2023-12-11 DIAGNOSIS — I5032 Chronic diastolic (congestive) heart failure: Secondary | ICD-10-CM | POA: Diagnosis not present

## 2023-12-11 DIAGNOSIS — D500 Iron deficiency anemia secondary to blood loss (chronic): Secondary | ICD-10-CM | POA: Diagnosis not present

## 2023-12-12 DIAGNOSIS — I50810 Right heart failure, unspecified: Secondary | ICD-10-CM | POA: Diagnosis not present

## 2023-12-12 DIAGNOSIS — I5030 Unspecified diastolic (congestive) heart failure: Secondary | ICD-10-CM | POA: Diagnosis not present

## 2023-12-12 DIAGNOSIS — N184 Chronic kidney disease, stage 4 (severe): Secondary | ICD-10-CM | POA: Diagnosis not present

## 2023-12-12 DIAGNOSIS — M1A9XX1 Chronic gout, unspecified, with tophus (tophi): Secondary | ICD-10-CM | POA: Diagnosis not present

## 2023-12-12 DIAGNOSIS — R609 Edema, unspecified: Secondary | ICD-10-CM | POA: Diagnosis not present

## 2023-12-17 ENCOUNTER — Ambulatory Visit: Payer: Medicare Other | Attending: Cardiology

## 2023-12-17 DIAGNOSIS — N184 Chronic kidney disease, stage 4 (severe): Secondary | ICD-10-CM | POA: Diagnosis not present

## 2023-12-17 DIAGNOSIS — L03011 Cellulitis of right finger: Secondary | ICD-10-CM | POA: Diagnosis not present

## 2023-12-17 DIAGNOSIS — Z7901 Long term (current) use of anticoagulants: Secondary | ICD-10-CM | POA: Diagnosis not present

## 2023-12-17 DIAGNOSIS — I4821 Permanent atrial fibrillation: Secondary | ICD-10-CM | POA: Diagnosis not present

## 2023-12-17 DIAGNOSIS — M1A39X1 Chronic gout due to renal impairment, multiple sites, with tophus (tophi): Secondary | ICD-10-CM | POA: Diagnosis not present

## 2023-12-17 DIAGNOSIS — I129 Hypertensive chronic kidney disease with stage 1 through stage 4 chronic kidney disease, or unspecified chronic kidney disease: Secondary | ICD-10-CM | POA: Diagnosis not present

## 2023-12-17 LAB — POCT INR: INR: 2 (ref 2.0–3.0)

## 2023-12-17 NOTE — Patient Instructions (Signed)
Continue taking Warfarin 1 tablet daily. Recheck INR 5 weeks.  Call us with any update or changes #[phone] or [phone].

## 2023-12-27 ENCOUNTER — Institutional Professional Consult (permissible substitution): Payer: Medicare Other | Admitting: Cardiology

## 2024-01-04 ENCOUNTER — Other Ambulatory Visit: Payer: Self-pay | Admitting: Internal Medicine

## 2024-01-08 ENCOUNTER — Other Ambulatory Visit: Payer: Self-pay | Admitting: Surgery

## 2024-01-08 DIAGNOSIS — I712 Thoracic aortic aneurysm, without rupture, unspecified: Secondary | ICD-10-CM

## 2024-01-09 DIAGNOSIS — Z23 Encounter for immunization: Secondary | ICD-10-CM | POA: Diagnosis not present

## 2024-01-14 DIAGNOSIS — M1A39X1 Chronic gout due to renal impairment, multiple sites, with tophus (tophi): Secondary | ICD-10-CM | POA: Diagnosis not present

## 2024-01-14 DIAGNOSIS — D500 Iron deficiency anemia secondary to blood loss (chronic): Secondary | ICD-10-CM | POA: Diagnosis not present

## 2024-01-14 DIAGNOSIS — I4821 Permanent atrial fibrillation: Secondary | ICD-10-CM | POA: Diagnosis not present

## 2024-01-14 DIAGNOSIS — Z1330 Encounter for screening examination for mental health and behavioral disorders, unspecified: Secondary | ICD-10-CM | POA: Diagnosis not present

## 2024-01-14 DIAGNOSIS — N184 Chronic kidney disease, stage 4 (severe): Secondary | ICD-10-CM | POA: Diagnosis not present

## 2024-01-14 DIAGNOSIS — I080 Rheumatic disorders of both mitral and aortic valves: Secondary | ICD-10-CM | POA: Diagnosis not present

## 2024-01-14 DIAGNOSIS — I10 Essential (primary) hypertension: Secondary | ICD-10-CM | POA: Diagnosis not present

## 2024-01-14 DIAGNOSIS — I5032 Chronic diastolic (congestive) heart failure: Secondary | ICD-10-CM | POA: Diagnosis not present

## 2024-01-20 NOTE — Progress Notes (Signed)
Office Visit Note  Patient: Daniel Love             Date of Birth: 1940-03-17           MRN: [id number]             PCP: Ernest Mallick, PA-C Referring: Arita Miss, MD Visit Date: 02/03/2024 Occupation: @GUAROCC @  Subjective:  History of gout   History of Present Illness: Daniel Love is a 84 y.o. male who presents today for new patient consultation as requested by his PCP.  Patient has been previously diagnosed with gout in his 12s.  Patient reports that his previous gout attacks involved his feet.  He is not currently experiencing any pain or inflammation in his feet or ankles but has aching and stiffness involving both hands.  He has difficulty holding silverware and performing fine motor skills.  Patient was started on allopurinol 100 mg 1 tablet by mouth daily about 1 and half to 2 months ago.  He has been tolerating allopurinol day-to-day but has noticed increased rectal bleeding.  Of note the patient remains on warfarin.  He is concerned about continuing to take allopurinol given his history of chronic kidney disease as well as long-term anticoagulation.  He takes colchicine very sparingly during flares only.     Activities of Daily Living:  Patient reports morning stiffness for 0 minutes.   Patient Denies nocturnal pain.  Difficulty dressing/grooming: Denies Difficulty climbing stairs: Reports Difficulty getting out of chair: Denies Difficulty using hands for taps, buttons, cutlery, and/or writing: Reports  Review of Systems  Constitutional:  Positive for fatigue.  HENT:  Positive for nose dryness. Negative for mouth sores and mouth dryness.   Eyes:  Negative for pain and dryness.  Respiratory:  Negative for shortness of breath and difficulty breathing.   Cardiovascular:  Negative for chest pain and palpitations.  Gastrointestinal:  Positive for blood in stool. Negative for constipation and diarrhea.       Followed by GI   Endocrine: Negative for  increased urination.  Genitourinary:  Negative for involuntary urination.  Musculoskeletal:  Positive for joint pain, joint pain and morning stiffness. Negative for gait problem, joint swelling, myalgias, muscle weakness, muscle tenderness and myalgias.  Skin:  Positive for sensitivity to sunlight. Negative for color change, rash and hair loss.  Allergic/Immunologic: Negative for susceptible to infections.  Neurological:  Negative for dizziness and headaches.  Hematological:  Negative for swollen glands.  Psychiatric/Behavioral:  Positive for sleep disturbance. Negative for depressed mood. The patient is not nervous/anxious.     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 04/15/2023   Thoracic aortic aneurysm (HCC) 01/16/2023   Chronic heart failure with preserved ejection fraction (HCC) 01/15/2023   Acquired thrombophilia (HCC) 12/12/2022   Mitral regurgitation and aortic stenosis 07/26/2022   Nonrheumatic tricuspid valve regurgitation 07/26/2022   Malignant neoplasm of prostate (HCC) 04/09/2018   Mitral valve insufficiency 02/26/2018   Benign hypertensive heart disease without heart failure 05/14/2013   Long term (current) use of anticoagulants 10/10/2011   Permanent atrial fibrillation (HCC)     Past Medical History:  Diagnosis Date   Anal fissure    hx of    Arthritis    Atrial fibrillation (HCC)    maintaining normal sinus rhythm   Chronic anticoagulation    coumadin   Dyspepsia  Dysrhythmia    Afib   GERD (gastroesophageal reflux disease)    Glaucoma    History of colon polyps 2012   Colonoscopy-Dr. Elnoria Howard    History of echocardiogram    Echocardiogram 2/19: EF 60-65, normal wall motion, trivial AI, moderate to severe MR, mild LAE, mild RAE, mild to moderate TR, PASP 35   History of kidney stones    History of nuclear stress test     Myoview 2/19: EF 42, diffuse HK no significant ischemia, apical lateral scar   Hypertension    Prostate cancer (HCC)    Skin cancer    skin cancer removed from left arm and back    Family History  Problem Relation Age of Onset   Cancer Mother        colon   Heart failure Father    Cancer Father        lung   Heart attack Father    Heart disease Brother    Prostate cancer Brother    Colon cancer Maternal Grandmother    Healthy Daughter    Healthy Daughter    Healthy Daughter    Stroke Neg Hx    Esophageal cancer Neg Hx    Rectal cancer Neg Hx    Stomach cancer Neg Hx    Liver disease Neg Hx    Past Surgical History:  Procedure Laterality Date   BACK SURGERY  12/31/1977   for degenerative disc disease   COLONOSCOPY     COLONOSCOPY WITH ESOPHAGOGASTRODUODENOSCOPY (EGD)     FLEXIBLE SIGMOIDOSCOPY N/A 04/25/2023   Procedure: FLEXIBLE SIGMOIDOSCOPY;  Surgeon: Iva Boop, MD;  Location: [facility];  Service: Gastroenterology;  Laterality: N/A;   HOT HEMOSTASIS N/A 04/25/2023   Procedure: HOT HEMOSTASIS (ARGON PLASMA COAGULATION/BICAP);  Surgeon: Iva Boop, MD;  Location: [facility];  Service: Gastroenterology;  Laterality: N/A;   IR RADIOLOGIST EVAL & MGMT  01/16/2022   IR RADIOLOGIST EVAL & MGMT  06/04/2022   IR RADIOLOGIST EVAL & MGMT  08/05/2023   PROSTATE BIOPSY     RADIOLOGY WITH ANESTHESIA N/A 02/21/2022   Procedure: CT WITH ANESTHESIA CRYOABLATION;  Surgeon: Irish Lack, MD;  Location: [facility];  Service: Radiology;  Laterality: N/A;   TEE WITHOUT CARDIOVERSION N/A 03/04/2019   Procedure: TRANSESOPHAGEAL ECHOCARDIOGRAM (TEE);  Surgeon: Vesta Mixer, MD;  Location: [facility];  Service: Cardiovascular;  Laterality: N/A;   toe removed     Right second toe   TRANSESOPHAGEAL ECHOCARDIOGRAM (CATH LAB) N/A 12/05/2023   Procedure: TRANSESOPHAGEAL ECHOCARDIOGRAM;  Surgeon: Christell Constant, MD;  Location: [facility];  Service: Cardiovascular;   Laterality: N/A;   UPPER GASTROINTESTINAL ENDOSCOPY     Social History   Social History Narrative   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 They share three daughters, two live local, one in [place]   09-26-18 Unable to ask abuse questions wife and daughter with him today.   Immunization History  Administered Date(s) Administered   Moderna Covid-19 Fall Seasonal Vaccine 55yrs & older 03/15/2023   PFIZER Comirnaty(Gray Top)Covid-19 Tri-Sucrose Vaccine 04/06/2021   PFIZER(Purple Top)SARS-COV-2 Vaccination 02/04/2020, 02/25/2020, 09/26/2020   Pfizer(Comirnaty)Fall Seasonal Vaccine 12 years and older 10/04/2022     Objective: Vital Signs: BP 120/66 (BP Location: Right Arm, Patient Position: Sitting, Cuff Size: Normal)   Pulse 70   Resp 14   Ht 5\' 9"  (1.753 m)   Wt 183 lb 9.6 oz (83.3 kg)   BMI 27.11  kg/m    Physical Exam Vitals and nursing note reviewed.  Constitutional:      Appearance: He is well-developed.  HENT:     Head: Normocephalic and atraumatic.  Eyes:     Conjunctiva/sclera: Conjunctivae normal.     Pupils: Pupils are equal, round, and reactive to light.  Cardiovascular:     Rate and Rhythm: Rhythm irregular.     Heart sounds: Normal heart sounds.  Pulmonary:     Effort: Pulmonary effort is normal.     Breath sounds: Normal breath sounds.  Abdominal:     General: Bowel sounds are normal.     Palpations: Abdomen is soft.  Musculoskeletal:     Cervical back: Normal range of motion and neck supple.  Skin:    General: Skin is warm and dry.     Capillary Refill: Capillary refill takes less than 2 seconds.     Comments: Tophi overlying hands  Neurological:     Mental Status: He is alert and oriented to person, place, and time.  Psychiatric:        Behavior: Behavior normal.       Musculoskeletal Exam: C-spine has good ROM.  Thoracic kyphosis noted.  Shoulder joints have good range of motion with no discomfort.  Small olecranon  bursitis, right elbow.  PIP and DIP thickening consistent with osteoarthritis of both hands.  Prominence of bilateral CMC joints.  Knee joints have good range of motion no warmth or effusion.  Ankle joints have good range of motion with no joint tenderness.  Pedal edema noted bilaterally.  CDAI Exam: CDAI Score: -- Patient Global: --; Provider Global: -- Swollen: --; Tender: -- Joint Exam 02/03/2024   No joint exam has been documented for this visit   There is currently no information documented on the homunculus. Go to the Rheumatology activity and complete the homunculus joint exam.  Investigation: No additional findings.  Imaging: No results found.  Recent Labs: Lab Results  Component Value Date   WBC 5.4 11/18/2023   HGB 10.4 (L) 11/18/2023   PLT 215 11/18/2023   NA 144 11/18/2023   K 3.9 11/18/2023   CL 104 11/18/2023   CO2 24 11/18/2023   GLUCOSE 94 11/18/2023   BUN 29 (H) 11/18/2023   CREATININE 1.73 (H) 11/18/2023   CALCIUM 9.1 11/18/2023   GFRAA 72 02/23/2019    Speciality Comments: No specialty comments available.  Procedures:  No procedures performed Allergies: Amlodipine, Lumigan [bimatoprost], and Lisinopril  Uric acid was 10.4 on 12/12/22 Uric acid was 10.6 on 12/12/23.   Assessment / Plan:     Visit Diagnoses: Idiopathic chronic gout of multiple sites with tophus - Diagnosed several years ago, colchicine use PRN, CKD, Hyperuricemia, Tophi present bilateral hands-started several months ago:  He has been experiencing an aching sensation and stiffness involving both hands but has not had any active inflammation.  He applies Voltaren gel topically to both hands especially in the evenings.  He has difficulty with fine motor skills including using silverware.  He has tophi overlying both hands--photos attached above.  He had previous gout flares in his feet for which she took colchicine on an as-needed basis to alleviate. Reviewed uric acid from 12/12/2022 which  was 10.4.  Uric acid was rechecked on 12/12/2023 at which time his uric acid was 10.6. The patient has been taking allopurinol 100 mg daily for the past ~1-1/2 but has not yet had his uric acid level rechecked. The dose of allopurinol has not  been increased due to the concern for concurrent use of warfarin.  The patient and his daughter have been concerned about the increased potency of warfarin given that he has had increased rectal bleeding since initiating allopurinol.  His INR was 3.1 on 01/21/2024. There has also been concern about the use of allopurinol and colchicine given his history of chronic kidney disease: Creatinine was 1.73 and GFR was 39 on 11/18/2023. Different treatment options were discussed today in detail given history of tophaceous gout including the use of allopurinol, probenecid, or Uloric.  He has not a good candidate for the use of probenecid given low GFR.  There is a concern for use of Uloric given significant cardiovascular history.  He is currently taking olmesartan so we discussed the option of talking with his cardiologist about possibly switching to losartan, which has shown to reduce uric acid levels.  For now he will remain on allopurinol 100 mg daily with close monitoring.  Plan to check uric acid level today along with CBC and CMP.  Discussed dietary recommendations for patients with gout today in detail and a handout was provided to the patient as well.  All questions were addressed.  He will follow-up in the office in 4 to 6 weeks or after seeing his cardiologist.  - Plan: Uric acid, COMPLETE METABOLIC PANEL WITH GFR, CBC with Differential/Platelet  Medication monitoring encounter - Allopurinol 100 mg 1 tablet by mouth daily.  Discussed that ideally his uric acid level should be less than 4 in order to start dissolving the tophi but it may be difficult to reach a therapeutic dose of allopurinol given concurrent use of warfarin. CBC, CMP, and uric acid rechecked today.   Plan: COMPLETE METABOLIC PANEL WITH GFR, CBC with Differential/Platelet  Other medical conditions are listed as follows:   Benign hypertensive heart disease without heart failure: He is currently prescribed Olmesartan but may benefit from switching to Losartan.   Chronic heart failure with preserved ejection fraction (HCC)  Long term (current) use of anticoagulants - Warfarin  Mitral regurgitation and aortic stenosis  Nonrheumatic mitral valve regurgitation  Nonrheumatic aortic valve insufficiency  Nonrheumatic tricuspid valve regurgitation  Permanent atrial fibrillation (HCC): Remains on Warfarin.   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   Malignant neoplasm of prostate [facility]): Radiation proctitis   Acquired thrombophilia (HCC)   Orders: Orders Placed This Encounter  Procedures   Uric acid   COMPLETE METABOLIC PANEL WITH GFR   CBC with Differential/Platelet   No orders of the defined types were placed in this encounter.   Follow-Up Instructions: Return in about 6 weeks (around 03/16/2024) for Gout.   Gearldine Bienenstock, PA-C  Note - This record has been created using Dragon software.  Chart creation errors have been sought, but may not always  have been located. Such creation errors do not reflect on  the standard of medical care.

## 2024-01-21 ENCOUNTER — Ambulatory Visit: Payer: Medicare Other | Attending: Cardiology | Admitting: *Deleted

## 2024-01-21 DIAGNOSIS — D62 Acute posthemorrhagic anemia: Secondary | ICD-10-CM | POA: Diagnosis not present

## 2024-01-21 DIAGNOSIS — I4821 Permanent atrial fibrillation: Secondary | ICD-10-CM | POA: Insufficient documentation

## 2024-01-21 DIAGNOSIS — D509 Iron deficiency anemia, unspecified: Secondary | ICD-10-CM | POA: Diagnosis not present

## 2024-01-21 DIAGNOSIS — N1832 Chronic kidney disease, stage 3b: Secondary | ICD-10-CM | POA: Diagnosis not present

## 2024-01-21 DIAGNOSIS — Z7901 Long term (current) use of anticoagulants: Secondary | ICD-10-CM | POA: Diagnosis not present

## 2024-01-21 LAB — POCT INR: INR: 3.1 — AB (ref 2.0–3.0)

## 2024-01-21 NOTE — Patient Instructions (Signed)
Description   Tomorrow take 1/2 tablet then continue taking Warfarin 1 tablet daily. Recheck INR 5 weeks.  Call us with any update or changes #[phone] or [phone].

## 2024-01-28 DIAGNOSIS — L720 Epidermal cyst: Secondary | ICD-10-CM | POA: Diagnosis not present

## 2024-01-28 DIAGNOSIS — L308 Other specified dermatitis: Secondary | ICD-10-CM | POA: Diagnosis not present

## 2024-01-28 DIAGNOSIS — L821 Other seborrheic keratosis: Secondary | ICD-10-CM | POA: Diagnosis not present

## 2024-01-28 DIAGNOSIS — L570 Actinic keratosis: Secondary | ICD-10-CM | POA: Diagnosis not present

## 2024-01-28 DIAGNOSIS — L812 Freckles: Secondary | ICD-10-CM | POA: Diagnosis not present

## 2024-01-28 DIAGNOSIS — Z85828 Personal history of other malignant neoplasm of skin: Secondary | ICD-10-CM | POA: Diagnosis not present

## 2024-02-03 ENCOUNTER — Ambulatory Visit: Payer: Medicare Other | Attending: Physician Assistant | Admitting: Physician Assistant

## 2024-02-03 ENCOUNTER — Encounter: Payer: Self-pay | Admitting: Physician Assistant

## 2024-02-03 VITALS — BP 120/66 | HR 70 | Resp 14 | Ht 69.0 in | Wt 183.6 lb

## 2024-02-03 DIAGNOSIS — I119 Hypertensive heart disease without heart failure: Secondary | ICD-10-CM | POA: Diagnosis not present

## 2024-02-03 DIAGNOSIS — I340 Nonrheumatic mitral (valve) insufficiency: Secondary | ICD-10-CM | POA: Insufficient documentation

## 2024-02-03 DIAGNOSIS — I080 Rheumatic disorders of both mitral and aortic valves: Secondary | ICD-10-CM | POA: Insufficient documentation

## 2024-02-03 DIAGNOSIS — I351 Nonrheumatic aortic (valve) insufficiency: Secondary | ICD-10-CM | POA: Insufficient documentation

## 2024-02-03 DIAGNOSIS — Z5181 Encounter for therapeutic drug level monitoring: Secondary | ICD-10-CM | POA: Diagnosis not present

## 2024-02-03 DIAGNOSIS — I7121 Aneurysm of the ascending aorta, without rupture: Secondary | ICD-10-CM | POA: Insufficient documentation

## 2024-02-03 DIAGNOSIS — Z7901 Long term (current) use of anticoagulants: Secondary | ICD-10-CM | POA: Diagnosis not present

## 2024-02-03 DIAGNOSIS — I5032 Chronic diastolic (congestive) heart failure: Secondary | ICD-10-CM | POA: Diagnosis not present

## 2024-02-03 DIAGNOSIS — I361 Nonrheumatic tricuspid (valve) insufficiency: Secondary | ICD-10-CM | POA: Diagnosis not present

## 2024-02-03 DIAGNOSIS — I4821 Permanent atrial fibrillation: Secondary | ICD-10-CM | POA: Diagnosis not present

## 2024-02-03 DIAGNOSIS — C61 Malignant neoplasm of prostate: Secondary | ICD-10-CM | POA: Diagnosis not present

## 2024-02-03 DIAGNOSIS — D6869 Other thrombophilia: Secondary | ICD-10-CM | POA: Diagnosis not present

## 2024-02-03 DIAGNOSIS — K627 Radiation proctitis: Secondary | ICD-10-CM | POA: Insufficient documentation

## 2024-02-03 DIAGNOSIS — M1A09X1 Idiopathic chronic gout, multiple sites, with tophus (tophi): Secondary | ICD-10-CM | POA: Diagnosis not present

## 2024-02-03 DIAGNOSIS — M1A09X0 Idiopathic chronic gout, multiple sites, without tophus (tophi): Secondary | ICD-10-CM | POA: Diagnosis not present

## 2024-02-03 NOTE — Patient Instructions (Addendum)
Discuss switching olmesartan to Losartan with your cardiologist     Information for patients with Gout  Gout defined-Gout occurs when urate crystals accumulate in your joint causing the inflammation and intense pain of gout attack.  Urate crystals can form when you have high levels of uric acid in your blood.  Your body produces uric acid when it breaks down prurines-substances that are found naturally in your body, as well as in certain foods such as organ meats, anchioves, herring, asparagus, and mushrooms.  Normally uric acid dissolves in your blood and passes through your kidneys into your urine.  But sometimes your body either produces too much uric acid or your kidneys excrete too little uric acid.  When this happens, uric acid can build up, forming sharp needle-like urate crystals in a joint or surrounding tissue that cause pain, inflammation and swelling.    Gout is characterized by sudden, severe attacks of pain, redness and tenderness in joints, often the joint at the base of the big toe.  Gout is complex form of arthritis that can affect anyone.  Men are more likely to get gout but women become increasingly more susceptible to gout after menopause.  An acute attack of gout can wake you up in the middle of the night with the sensation that your big toe is on fire.  The affected joint is hot, swollen and so tender that even the weight or the sheet on it may seem intolerable.  If you experience symptoms of an acute gout attack it is important to your doctor as soon as the symptoms start.  Gout that goes untreated can lead to worsening pain and joint damage.  Risk Factors:  You are more likely to develop gout if you have high levels of uric acid in your body.    Factors that increase the uric acid level in your body include:  Lifestyle factors.  Excessive alcohol use-generally more than two drinks a day for men and more than one for women increase the risk of gout.  Medical conditions.   Certain conditions make it more likely that you will develop gout.  These include hypertension, and chronic conditions such as diabetes, high levels of fat and cholesterol in the blood, and narrowing of the arteries.  Certain medications.  The uses of Thiazide diuretics- commonly used to treat hypertension and low dose aspirin can also increase uric acid levels.  Family history of gout.  If other members of your family have had gout, you are more likely to develop the disease.  Age and sex. Gout occurs more often in men than it does in women, primarily because women tend to have lower uric acid levels than men do.  Men are more likely to develop gout earlier usually between the ages of 33-50- whereas women generally develop signs and symptoms after menopause.    Tests and diagnosis:  Tests to help diagnose gout may include:  Blood test.  Your doctor may recommend a blood test to measure the uric acid level in your blood .  Blood tests can be misleading, though.  Some people have high uric acid levels but never experience gout.  And some people have signs and symptoms of gout, but don't have unusual levels of uric acid in their blood.  Joint fluid test.  Your doctor may use a needle to draw fluid from your affected joint.  When examined under the microscope, your joint fluid may reveal urate crystals.  Treatment:  Treatment for gout usually  involves medications.  What medications you and your doctor choose will be based on your current health and other medications you currently take.  Gout medications can be used to treat acute gout attacks and prevent future attacks as well as reduce your risk of complications from gout such as the development of tophi from urate crystal deposits.  Alternative medicine:   Certain foods have been studied for their potential to lower uric acid levels, including:  Coffee.  Studies have found an association between coffee drinking (regular and decaf) and lower  uric acid levels.  The evidence is not enough to encourage non-coffee drinkers to start, but it may give clues to new ways of treating gout in the future.  Vitamin C.  Supplements containing vitamin C may reduce the levels of uric acid in your blood.  However, vitamin as a treatment for gout. Don't assume that if a little vitamin C is good, than lots is better.  Megadoses of vitamin C may increase your bodies uric acid levels.  Cherries.  Cherries have been associated with lower levels of uric acid in studies, but it isn't clear if they have any effect on gout signs and symptoms.  Eating more cherries and other dar-colored fruits, such as blackberries, blueberries, purple grapes and raspberries, may be a safe way to support your gout treatment.    Lifestyle/Diet Recommendations:  Drink 8 to 16 cups ( about 2 to 4 liters) of fluid each day, with at least half being water. Avoid alcohol Eat a moderate amount of protein, preferably from healthy sources, such as low-fat or fat-free dairy, tofu, eggs, and nut butters. Limit you daily intake of meat, fish, and poultry to 4 to 6 ounces. Avoid high fat meats and desserts. Decrease you intake of shellfish, beef, lamb, pork, eggs and cheese. Choose a good source of vitamin C daily such as citrus fruits, strawberries, broccoli,  brussel sprouts, papaya, and cantaloupe.  Choose a good source of vitamin A every other day such as yellow fruits, or dark green/yellow vegetables. Avoid drastic weigh reduction or fasting.  If weigh loss is desired lose it over a period of several months. See "dietary considerations.." chart for specific food recommendations.  Dietary Considerations for people with Gout  Food with negligible purine content (0-15 mg of purine nitrogen per 100 grams food)  May use as desired except on calorie variations  Non fat milk Cocoa Cereals (except in list II) Hard candies  Buttermilk Carbonated drinks Vegetables (except in list II)  Sherbet  Coffee Fruits Sugar Honey  Tea Cottage Cheese Gelatin-jell-o Salt  Fruit juice Breads Angel food Cake   Herbs/spices Jams/Jellies [employer]    Foods that do not contain excessive purine content, but must be limited due to fat content  Cream Eggs Oil and Salad Dressing  Half and Half Peanut Butter Chocolate  Whole Milk Cakes Potato Chips  Butter Ice Cream Fried Foods  Cheese Nuts Waffles, pancakes   List II: Food with moderate purine content (50-150 mg of purine nitrogen per 100 grams of food)  Limit total amount each day to 5 oz. cooked Lean meat, other than those on list III   Poultry, other than those on list III Fish, other than those on list III   Seafood, other than those on list III  These foods may be used occasionally  Peas Lentils Bran  Spinach Oatmeal Dried Beans and Peas  Asparagus Wheat Germ Mushrooms   Additional information about meat choices  Choose fish and poultry, particularly without skin, often.  Select lean, well trimmed cuts of meat.  Avoid all fatty meats, bacon , sausage, fried meats, fried fish, or poultry, luncheon meats, cold cuts, hot dogs, meats canned or frozen in gravy, spareribs and frozen and packaged prepared meats.   List III: Foods with HIGH purine content / Foods to AVOID (150-800 mg of purine nitrogen per 100 grams of food)  Anchovies Herring Meat Broths  Liver Mackerel Meat Extracts  Kidney Scallops Meat Drippings  Sardines Wild Game Mincemeat  Sweetbreads Goose Gravy  Heart Tongue Yeast, baker's and brewers   Commercial soups made with any of the foods listed in List II or List III  In addition avoid all alcoholic beverages    Low-Purine Eating Plan A low-purine eating plan involves making food choices to limit your purine intake. Purine is a kind of uric acid. Too much uric acid in your blood can cause certain conditions, such as gout and kidney stones. Eating a low-purine diet may help control these  conditions. What are tips for following this plan? Shopping Avoid buying products that contain high-fructose corn syrup. Check for this on food labels. It is commonly found in many processed foods and soft drinks. Be sure to check for it in baked goods such as cookies, canned fruits, and cereals and cereal bars. Avoid buying veal, chicken breast with skin, lamb, and organ meats such as liver. These types of meats tend to have the highest purine content. Choose dairy products. These may lower uric acid levels. Avoid certain types of fish. Not all fish and seafood have high purine content. Examples with high purine content include anchovies, trout, tuna, sardines, and salmon. Avoid buying beverages that contain alcohol, particularly beer and hard liquor. Alcohol can affect the way your body gets rid of uric acid. Meal planning  Learn which foods do or do not affect you. If you find out that a food tends to cause your gout symptoms to flare up, avoid eating that food. You can enjoy foods that do not cause problems. If you have any questions about a food item, talk with your dietitian or health care provider. Reduce the overall amount of meat in your diet. When you do eat meat, choose ones with lower purine content. Include plenty of fruits and vegetables. Although some vegetables may have a high purine content--such as asparagus, mushrooms, spinach, or cauliflower--it has been shown that these do not contribute to uric acid blood levels as much. Consume at least 1 dairy serving a day. This has been shown to decrease uric acid levels. General information If you drink alcohol: Limit how much you have to: 0-1 drink a day for women who are not pregnant. 0-2 drinks a day for men. Know how much alcohol is in a drink. In the [place] one drink equals one 12 oz bottle of beer (355 mL), one 5 oz glass of wine (148 mL), or one 1 oz glass of hard liquor (44 mL). Drink plenty of water. Try to drink enough to keep  your urine pale yellow. Fluids can help remove uric acid from your body. Work with your health care provider and dietitian to develop a plan to achieve or maintain a healthy weight. Losing weight may help reduce uric acid in your blood. What foods are recommended? The following are some types of foods that are good choices when limiting purine intake: Fresh or frozen fruits and vegetables. Whole grains, breads, cereals, and pasta. Rice. Beans,  peas, legumes. Nuts and seeds. Dairy products. Fats and oils. The items listed above may not be a complete list. Talk with a dietitian about what dietary choices are best for you. What foods are not recommended? Limit your intake of foods high in purines, including: Beer and other alcohol. Meat-based gravy or sauce. Canned or fresh fish, such as: Anchovies, sardines, herring, salmon, and tuna. Mussels and scallops. Codfish, trout, and haddock. Bacon, veal, chicken breast with skin, and lamb. Organ meats, such as: Liver or kidney. Tripe. Sweetbreads (thymus gland or pancreas). Wild Education officer, environmental. Yeast or yeast extract supplements. Drinks sweetened with high-fructose corn syrup, such as soda. Processed foods made with high-fructose corn syrup. The items listed above may not be a complete list of foods and beverages you should limit. Contact a dietitian for more information. Summary Eating a low-purine diet may help control conditions caused by too much uric acid in the body, such as gout or kidney stones. Choose low-purine foods, limit alcohol, and limit high-fructose corn syrup. You will learn over time which foods do or do not affect you. If you find out that a food tends to cause your gout symptoms to flare up, avoid eating that food. This information is not intended to replace advice given to you by your health care provider. Make sure you discuss any questions you have with your health care provider. Document Revised: 11/30/2021 Document  Reviewed: 11/30/2021 Elsevier Patient Education  2024 [employer].

## 2024-02-04 LAB — COMPLETE METABOLIC PANEL WITH GFR
AG Ratio: 1.5 (calc) (ref 1.0–2.5)
ALT: 18 U/L (ref 9–46)
AST: 25 U/L (ref 10–35)
Albumin: 4 g/dL (ref 3.6–5.1)
Alkaline phosphatase (APISO): 75 U/L (ref 35–144)
BUN/Creatinine Ratio: 29 (calc) — ABNORMAL HIGH (ref 6–22)
BUN: 48 mg/dL — ABNORMAL HIGH (ref 7–25)
CO2: 27 mmol/L (ref 20–32)
Calcium: 9.3 mg/dL (ref 8.6–10.3)
Chloride: 105 mmol/L (ref 98–110)
Creat: 1.67 mg/dL — ABNORMAL HIGH (ref 0.70–1.22)
Globulin: 2.7 g/dL (ref 1.9–3.7)
Glucose, Bld: 77 mg/dL (ref 65–99)
Potassium: 4.7 mmol/L (ref 3.5–5.3)
Sodium: 143 mmol/L (ref 135–146)
Total Bilirubin: 0.5 mg/dL (ref 0.2–1.2)
Total Protein: 6.7 g/dL (ref 6.1–8.1)
eGFR: 40 mL/min/{1.73_m2} — ABNORMAL LOW (ref >=60)

## 2024-02-04 LAB — CBC WITH DIFFERENTIAL/PLATELET
Absolute Lymphocytes: 773 {cells}/uL — ABNORMAL LOW (ref 850–3900)
Absolute Monocytes: 711 {cells}/uL (ref 200–950)
Basophils Absolute: 41 {cells}/uL (ref 0–200)
Basophils Relative: 0.6 %
Eosinophils Absolute: 152 {cells}/uL (ref 15–500)
Eosinophils Relative: 2.2 %
HCT: 35.5 % — ABNORMAL LOW (ref 38.5–50.0)
Hemoglobin: 11.3 g/dL — ABNORMAL LOW (ref 13.2–17.1)
MCH: 28.5 pg (ref 27.0–33.0)
MCHC: 31.8 g/dL — ABNORMAL LOW (ref 32.0–36.0)
MCV: 89.6 fL (ref 80.0–100.0)
MPV: 9.6 fL (ref 7.5–12.5)
Monocytes Relative: 10.3 %
Neutro Abs: 5223 {cells}/uL (ref 1500–7800)
Neutrophils Relative %: 75.7 %
Platelets: 177 10*3/uL (ref 140–400)
RBC: 3.96 10*6/uL — ABNORMAL LOW (ref 4.20–5.80)
RDW: 18.9 % — ABNORMAL HIGH (ref 11.0–15.0)
Total Lymphocyte: 11.2 %
WBC: 6.9 10*3/uL (ref 3.8–10.8)

## 2024-02-04 LAB — URIC ACID: Uric Acid, Serum: 7.2 mg/dL (ref 4.0–8.0)

## 2024-02-04 NOTE — Progress Notes (Signed)
Patient remains anemic but improving--hemoglobin went from 10.4 to 11.3.   Creatinine remains elevated but has improved.  GFR is low but continues to trend up.  Rest of CMP WNL.  Uric acid is 7.2--improved since starting allopurinol 100 mg daily.  Continue current treatment regimen for now-recommend follow up with cardiologist to discuss switching to losartan

## 2024-02-05 ENCOUNTER — Other Ambulatory Visit (HOSPITAL_COMMUNITY): Payer: Self-pay

## 2024-02-05 ENCOUNTER — Ambulatory Visit (INDEPENDENT_AMBULATORY_CARE_PROVIDER_SITE_OTHER): Payer: Self-pay | Admitting: Pharmacist

## 2024-02-05 ENCOUNTER — Ambulatory Visit: Payer: Medicare Other | Attending: Cardiology | Admitting: Cardiology

## 2024-02-05 ENCOUNTER — Encounter: Payer: Self-pay | Admitting: Cardiology

## 2024-02-05 VITALS — BP 116/68 | HR 72 | Ht 69.0 in | Wt 182.8 lb

## 2024-02-05 DIAGNOSIS — I4821 Permanent atrial fibrillation: Secondary | ICD-10-CM | POA: Diagnosis not present

## 2024-02-05 DIAGNOSIS — Z7901 Long term (current) use of anticoagulants: Secondary | ICD-10-CM | POA: Diagnosis not present

## 2024-02-05 DIAGNOSIS — I5032 Chronic diastolic (congestive) heart failure: Secondary | ICD-10-CM

## 2024-02-05 LAB — POCT INR: INR: 1.4 — AB (ref 2.0–3.0)

## 2024-02-05 MED ORDER — APIXABAN 2.5 MG PO TABS: 2.5000 mg | ORAL_TABLET | Freq: Two times a day (BID) | ORAL | 3 refills | Status: DC

## 2024-02-05 MED ORDER — APIXABAN 2.5 MG PO TABS: 2.5000 mg | ORAL_TABLET | Freq: Two times a day (BID) | ORAL | 0 refills | Status: DC

## 2024-02-05 NOTE — Progress Notes (Signed)
 Electrophysiology Office Note:    Date:  02/05/2024   ID:  Daniel Love, Daniel Love Dec 22, 1940, MRN [id number]  [facility] Cardiologist:  Daniel DELENA Leavens, MD  [facility] Electrophysiologist:  Daniel ONEIDA HOLTS, MD   Referring MD: Daniel Love, [place]*   Chief Complaint: Atrial fibrillation  History of Present Illness:    Daniel Love is an 84 year old man who I am seeing today for an evaluation of atrial fibrillation at the request of Daniel Love. The patient last saw Daniel Love January 14, 2024.  The patient has a history of permanent atrial fibrillation, stage IV CKD, HFpEF, hypertension.  He has seen Daniel Love in the past, most recently November 18, 2023.  The patient also has frequent GI bleeding.  His GI bleeding and anemia has required cessation of anticoagulation.  At the last appointment, Daniel Love discussed watchman and referred.  The patient is accompanied by his daughter today who is very involved in his healthcare.  He has been on Coumadin  for many many years.  His Coumadin  therapy has been complicated by drug interactions, fluctuating INRs and GI bleeding.  He recently cut his Coumadin  dose in half and has not had his INR rechecked after making that change.      Their past medical, social and family history was reviewed.   ROS:   Please see the history of present illness.    All other systems reviewed and are negative.  EKGs/Labs/Other Studies Reviewed:    The following studies were reviewed today:  December 05, 2023 transesophageal echo EF 55-60 Moderately reduced RV function Severely dilated left atrium Appendage without thrombus and amenable to closure Moderate to severe MR Severe TR Mild to moderate AI  October 25, 2023 EKG shows atrial fibrillation, right bundle branch block        Physical Exam:    VS:  BP 116/68   Pulse 72   Ht 5' 9 (1.753 m)   Wt 182 lb 12.8 oz (82.9 kg)   SpO2 99%   BMI 26.99  kg/m     Wt Readings from Last 3 Encounters:  02/05/24 182 lb 12.8 oz (82.9 kg)  02/03/24 183 lb 9.6 oz (83.3 kg)  12/05/23 181 lb (82.1 kg)     GEN: no distress, elderly CARD: Irregularly irregular, No MRG RESP: No IWOB. CTAB.        ASSESSMENT AND PLAN:    1. Permanent atrial fibrillation (HCC)   2. Chronic heart failure with preserved ejection fraction (HCC)     #Permanent atrial fibrillation #History of GI bleeding #Anemia of chronic kidney disease, CKD 4 The patient has a long history of atrial fibrillation with inconsistent use of anticoagulation for stroke risk mitigation given trouble with GI bleeding and anemia.  Left atrial appendage occlusion has been discussed with the patient by his primary cardiologist, Daniel Love.  I discussed the Watchman procedure in detail during today's clinic appointment.  We have discussed the procedure, its risks, likelihood of success and recovery.  We have discussed the need for short-term anticoagulation around the time of watchman implant.  To start, I would like to stop his Coumadin  and transition him to Eliquis .  I will ask our pharmacist to assist with this transition.  If he continues to have trouble with bleeding, I think he would be a good candidate for left atrial appendage occlusion.  --------------------  I have seen Daniel Love in the office today who is being considered for a Watchman left atrial  appendage closure device. I believe they will benefit from this procedure given their history of atrial fibrillation, CHA2DS2-VASc score of 4. Unfortunately, the patient is not felt to be a long term anticoagulation candidate secondary to history of GI bleeding. The patient's chart has been reviewed and I feel that they would be a candidate for short term oral anticoagulation after Watchman implant.   It is my belief that after undergoing a LAA closure procedure, Daniel Love will not need long term anticoagulation which  eliminates anticoagulation side effects and major bleeding risk.   Procedural risks for the Watchman implant have been reviewed with the patient including a 0.5% risk of stroke, <1% risk of perforation and <1% risk of device embolization. Other risks include bleeding, vascular damage, tamponade, worsening renal function, and death. The patient understands these risks.   The published clinical data on the safety and effectiveness of WATCHMAN include but are not limited to the following: - Holmes DR, [name], Sick P et al. for the PROTECT AF Investigators. Percutaneous closure of the left atrial appendage versus warfarin therapy for prevention of stroke in patients with atrial fibrillation: a randomised non-inferiority trial. Lancet 2009; 374: 534-42. [name], Doshi SK, Jonita VEAR [name] et al. on behalf of the PROTECT AF Investigators. Percutaneous Left Atrial Appendage Closure for Stroke Prophylaxis in Patients With Atrial Fibrillation 2.3-Year Follow-up of the PROTECT AF (Watchman Left Atrial Appendage System for Embolic Protection in Patients With Atrial Fibrillation) Trial. Circulation 2013; 127:720-729. - Alli O, Doshi S,  Kar S, Reddy VY, Sievert H et al. Quality of Life Assessment in the Randomized PROTECT AF (Percutaneous Closure of the Left Atrial Appendage Versus Warfarin Therapy for Prevention of Stroke in Patients With Atrial Fibrillation) Trial of Patients at Risk for Stroke With Nonvalvular Atrial Fibrillation. J Am Coll Cardiol 2013; 61:1790-8. [name], Archer RAMAN, Price M, Whisenant B, Sievert H, Doshi S, Huber K, Reddy V. Prospective randomized evaluation of the Watchman left atrial appendage Device in patients with atrial fibrillation versus long-term warfarin therapy; the PREVAIL trial. Journal of the [employer] of Cardiology, Vol. 4, No. 1, 2014, 1-11. - Kar S, Doshi SK, Sadhu A, Horton R, Osorio J et al. Primary outcome evaluation of a next-generation left atrial appendage  closure device: results from the PINNACLE FLX trial. Circulation 2021;143(18)1754-1762.     No CT planned given CKD 4.  HAS-BLED score 3 Hypertension Yes  Abnormal renal and liver function (Dialysis, transplant, Cr >2.26 mg/dL /Cirrhosis or Bilirubin >2x Normal or AST/ALT/AP >3x Normal) No  Stroke No  Bleeding Yes  Labile INR (Unstable/high INR) No  Elderly (>65) Yes  Drugs or alcohol (>= 8 drinks/week, anti-plt or NSAID) No   CHA2DS2-VASc Score = 4  The patient's score is based upon: CHF History: 1 HTN History: 1 Diabetes History: 0 Stroke History: 0 Vascular Disease History: 0 Age Score: 2 Gender Score: 0   #Chronic diastolic heart failure NYHA class II. Warm on exam.  Continue medical therapy as directed by Dr. Santo.   Follow-up 3 months with EP APP.    Signed, Daniel DASEN. Cindie, MD, [facility], [facility] 02/05/2024 3:24 PM    Electrophysiology [facility]

## 2024-02-05 NOTE — Progress Notes (Signed)
 Did coverage assessment for Eliquis  the cost is around $47 per month per test claim.

## 2024-02-05 NOTE — Addendum Note (Signed)
 Addended by: CHAUVIGNE, Aleric Froelich on: 02/05/2024 04:20 PM   Modules accepted: Orders

## 2024-02-05 NOTE — Patient Instructions (Addendum)
 Medication Instructions:  Your physician has recommended you make the following change in your medication:  1) STOP taking coumadin   2) START taking Eliquis  2.5 mg twice daily (start this tonight)   *If you need a refill on your cardiac medications before your next appointment, please call your pharmacy*  Follow-Up: At [facility], you and your health needs are our priority.  As part of our continuing mission to provide you with exceptional heart care, we have created designated Provider Care Teams.  These Care Teams include your primary Cardiologist (physician) and Advanced Practice Providers (APPs -  Physician Assistants and Nurse Practitioners) who all work together to provide you with the care you need, when you need it.   Contact Nurse Navigator, [place]y at [phone] if you decide to proceed with Watchman  3 month follow up with EP APP

## 2024-02-05 NOTE — Addendum Note (Signed)
 Addended by: CHAUVIGNE, Mekiyah Gladwell on: 02/05/2024 03:52 PM   Modules accepted: Orders

## 2024-02-06 ENCOUNTER — Ambulatory Visit
Admission: RE | Admit: 2024-02-06 | Discharge: 2024-02-06 | Disposition: A | Discharge status: Home / Self Care | Payer: Medicare Other | Source: Ambulatory Visit | Attending: Surgery | Admitting: Surgery

## 2024-02-06 DIAGNOSIS — I712 Thoracic aortic aneurysm, without rupture, unspecified: Secondary | ICD-10-CM

## 2024-02-06 DIAGNOSIS — I2510 Atherosclerotic heart disease of native coronary artery without angina pectoris: Secondary | ICD-10-CM | POA: Diagnosis not present

## 2024-02-06 DIAGNOSIS — I7121 Aneurysm of the ascending aorta, without rupture: Secondary | ICD-10-CM | POA: Diagnosis not present

## 2024-02-12 ENCOUNTER — Encounter: Payer: Self-pay | Admitting: Surgery

## 2024-02-12 ENCOUNTER — Ambulatory Visit (INDEPENDENT_AMBULATORY_CARE_PROVIDER_SITE_OTHER): Payer: Medicare Other | Admitting: Surgery

## 2024-02-12 VITALS — BP 107/64 | HR 64 | Resp 18 | Ht 69.0 in | Wt 182.0 lb

## 2024-02-12 DIAGNOSIS — I712 Thoracic aortic aneurysm, without rupture, unspecified: Secondary | ICD-10-CM

## 2024-02-13 NOTE — Progress Notes (Signed)
HPI:  The patient is an 84 year old gentleman who returns for follow-up of a stable 4.5 cm fusiform ascending aortic aneurysm. He continues to feel well without chest or back pain. He has a history of congestive heart failure and moderate to severe mitral regurgitation by echocardiogram.  He is being evaluated for a Watchman device by EP due to persistent lower GI bleeding on anticoagulation.  He had a TEE on 12/05/2023 showing a left ventricular ejection fraction of 55 to 60%.  The right ventricle was severely enlarged with moderate systolic dysfunction and severely elevated pulm artery systolic pressure.  The mitral valve was myxomatous with moderate to severe mitral regurgitation.  There was severe tricuspid regurgitation.  There was mild to moderate aortic insufficiency with an aortic diameter of 4.5 cm.  He denies any significant shortness of breath and has had no chest discomfort.  He has some mild lower extremity edema.  He is here today with his daughter who is very involved in his healthcare.  Current Outpatient Medications  Medication Sig Dispense Refill   acetaminophen (TYLENOL) 500 MG tablet Take 1,000 mg by mouth every 6 (six) hours as needed (for pain.).     allopurinol (ZYLOPRIM) 100 MG tablet Take 100 mg by mouth daily.     apixaban (ELIQUIS) 2.5 MG TABS tablet Take 1 tablet (2.5 mg total) by mouth 2 (two) times daily. 180 tablet 3   apixaban (ELIQUIS) 2.5 MG TABS tablet Take 1 tablet (2.5 mg total) by mouth 2 (two) times daily. 28 tablet 0   clobetasol cream (TEMOVATE) 0.05 % Apply 1 Application topically as needed.     clotrimazole-betamethasone (LOTRISONE) cream Apply 1 Application topically daily as needed (rash).     colchicine 0.6 MG tablet Take 0.6 mg by mouth daily as needed (gout flare).     dapagliflozin propanediol (FARXIGA) 10 MG TABS tablet TAKE 1 TABLET BY MOUTH DAILY BEFORE BREAKFAST 90 tablet 3   diclofenac Sodium (VOLTAREN) 1 % GEL Apply topically as needed.      diphenhydramine-acetaminophen (TYLENOL PM) 25-500 MG TABS tablet Take 1 tablet by mouth at bedtime as needed (sleep).     ferrous sulfate 325 (65 FE) MG tablet Take 325 mg by mouth daily with breakfast.     furosemide (LASIX) 40 MG tablet Take 1 tablet (40 mg total) by mouth 2 (two) times daily. 90 tablet 3   isosorbide mononitrate (IMDUR) 30 MG 24 hr tablet Take 15 mg by mouth in the morning.     latanoprost (XALATAN) 0.005 % ophthalmic solution Place 1 drop into both eyes at bedtime.     Menthol, Topical Analgesic, (ICY HOT BACK EX) Apply 1 application topically daily as needed (pain).     Multiple Vitamin (MULTIVITAMIN WITH MINERALS) TABS tablet Take 1 tablet by mouth in the morning. Centrum Silver     olmesartan (BENICAR) 40 MG tablet Take 1 tablet (40 mg total) by mouth daily. 90 tablet 3   oxymetazoline (AFRIN) 0.05 % nasal spray Place 1 spray into both nostrils at bedtime as needed for congestion.     sucralfate (CARAFATE) 1 GM/10ML suspension Place 2 g rectally daily as needed (gi issues).     timolol (TIMOPTIC) 0.5 % ophthalmic solution Place 1 drop into both eyes 2 (two) times daily.     No current facility-administered medications for this visit.     Physical Exam: BP 107/64 (BP Location: Left Arm)   Pulse 64   Resp 18   Ht 5'  9" (1.753 m)   Wt 182 lb (82.6 kg)   SpO2 100% Comment: RA  BMI 26.88 kg/m  He looks well. Cardiac exam shows an irregular rate and rhythm.  There is no murmur. Lungs are clear. There is mild bilateral ankle edema.  Diagnostic Tests:  Narrative & Impression  CLINICAL DATA:  Follow-up ascending thoracic aortic aneurysm.   EXAM: CT CHEST WITHOUT CONTRAST   TECHNIQUE: Multidetector CT imaging of the chest was performed following the standard protocol without IV contrast.   RADIATION DOSE REDUCTION: This exam was performed according to the departmental dose-optimization program which includes automated exposure control, adjustment of the mA  and/or kV according to patient size and/or use of iterative reconstruction technique.   COMPARISON:  CT scan 02/27/2023   FINDINGS: Cardiovascular: The heart is normal in size. No pericardial effusion. Stable fusiform aneurysmal dilatation of the ascending thoracic aorta measuring a maximum of 4.4 cm and unchanged since prior studies. Recommend annual imaging followup by CTA or MRA. This recommendation follows 2010 ACCF/AHA/AATS/ACR/ASA/SCA/SCAI/SIR/STS/SVM Guidelines for the Diagnosis and Management of Patients with Thoracic Aortic Disease. Circulation. 2010; 121: Z610-R604. Aortic aneurysm NOS (ICD10-I71.9).   Stable scattered aortic calcifications. Stable three-vessel coronary artery calcifications.   Mediastinum/Nodes: No mediastinal or hilar mass or lymphadenopathy. Small scattered lymph nodes are stable. The esophagus is unremarkable. The thyroid gland is normal.   Lungs/Pleura: No acute pulmonary process. No infiltrates, edema effusions. No worrisome pulmonary lesions or pulmonary nodules. Stable calcified granuloma at the right lung base. Stable basilar scarring changes most notably in the right middle lobe. No pleural effusions or pleural lesions.   Upper Abdomen: Stable cyst at the hepatic dome. No hepatic lesions or intrahepatic biliary dilatation. The gallbladder is unremarkable. No adrenal gland lesions. Stable vascular calcifications.   Musculoskeletal: No significant bony findings.   IMPRESSION: 1. Stable fusiform aneurysmal dilatation of the ascending thoracic aorta measuring a maximum of 4.4 cm and unchanged since prior studies. 2. No acute pulmonary findings. 3. Stable three-vessel coronary artery calcifications. 4. Stable hepatic cyst. 5. Aortic atherosclerosis.     Electronically Signed   By: Rudie Meyer M.D.   On: 02/06/2024 10:58    Impression:  He has a stable 4.4 cm fusiform ascending aortic aneurysm. His aneurysm is still well below the  surgical threshold of 5.5 cm.  I reviewed the CT images with him and his daughter and answered all of their questions.  I stressed the importance of continued good blood pressure control in preventing further enlargement and acute aortic dissection.  I advised him against doing any heavy lifting that may require a Valsalva maneuver and could suddenly raise his blood pressure to high levels.   Plan:  He will return to see me in 1 year with a CT scan of the chest without contrast for aortic surveillance.  I spent 15 minutes performing this established patient evaluation and > 50% of this time was spent face to face counseling and coordinating the care of this patient's aortic aneurysm.    Alleen Borne, MD [facility] [phone]

## 2024-02-21 DIAGNOSIS — N184 Chronic kidney disease, stage 4 (severe): Secondary | ICD-10-CM | POA: Diagnosis not present

## 2024-03-03 NOTE — Progress Notes (Unsigned)
 Office Visit Note  Patient: Daniel Love             Date of Birth: 1940/05/19           MRN: [id number]             PCP: Vladimir Faster Referring: Ernest Mallick, [place]* Visit Date: 03/17/2024 Occupation: @GUAROCC @  Subjective:  Medication monitoring   History of Present Illness: Daniel Love is a 84 y.o. male with history of gout.  Patient was accompanied by one of his daughters today in the office.  Patient was seen for the initial consultation on 02/03/2024 at which time he was taking allopurinol 100 mg daily.  After his last office visit he had discontinued allopurinol and restarted taking it on Sunday.  He continues to have chronic stiffness in both hands.  He recently was performing some yard work and had an exacerbation of pain in his right hand.  He takes Tylenol as needed for pain relief.  He takes colchicine if needed for a gout flare.  Since resuming allopurinol he has noticed an increase gas and loose stool. He is still considering proceeding with the Watchman procedure.  He has been switched from warfarin to Eliquis.  He still has occasional blood in his stool.   Activities of Daily Living:  Patient reports morning stiffness for all day. Patient Reports nocturnal pain.  Difficulty dressing/grooming: Reports Difficulty climbing stairs: Denies Difficulty getting out of chair: Denies Difficulty using hands for taps, buttons, cutlery, and/or writing: Reports  Review of Systems  Constitutional:  Negative for fatigue.  HENT:  Positive for mouth dryness. Negative for mouth sores and nose dryness.   Eyes:  Negative for pain and dryness.  Respiratory:  Negative for shortness of breath and difficulty breathing.   Cardiovascular:  Negative for chest pain and palpitations.  Gastrointestinal:  Positive for abdominal pain, blood in stool and diarrhea. Negative for constipation.  Endocrine: Negative for increased urination.  Genitourinary:  Negative for  involuntary urination.  Musculoskeletal:  Positive for joint pain, joint pain, joint swelling and morning stiffness. Negative for gait problem, myalgias, muscle weakness, muscle tenderness and myalgias.  Skin:  Negative for color change, rash, hair loss and sensitivity to sunlight.  Allergic/Immunologic: Negative for susceptible to infections.  Neurological:  Negative for dizziness and headaches.  Hematological:  Negative for swollen glands.  Psychiatric/Behavioral:  Positive for depressed mood. Negative for sleep disturbance. The patient is nervous/anxious.     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 04/15/2023   Thoracic aortic aneurysm (HCC) 01/16/2023   Chronic heart failure with preserved ejection fraction (HCC) 01/15/2023   Acquired thrombophilia (HCC) 12/12/2022   Mitral regurgitation and aortic stenosis 07/26/2022   Nonrheumatic tricuspid valve regurgitation 07/26/2022   Malignant neoplasm of prostate (HCC) 04/09/2018   Mitral valve insufficiency 02/26/2018   Benign hypertensive heart disease without heart failure 05/14/2013    Past Medical History:  Diagnosis Date   Anal fissure    hx of    Arthritis    Atrial fibrillation (HCC)    maintaining normal sinus rhythm   Chronic anticoagulation    coumadin   Dyspepsia    Dysrhythmia    Afib   GERD (gastroesophageal reflux disease)    Glaucoma    History of colon polyps 2012   Colonoscopy-Dr. Elnoria Howard    History  of echocardiogram    Echocardiogram 2/19: EF 60-65, normal wall motion, trivial AI, moderate to severe MR, mild LAE, mild RAE, mild to moderate TR, PASP 35   History of kidney stones    History of nuclear stress test    Myoview 2/19: EF 42, diffuse HK no significant ischemia, apical lateral scar   Hypertension    Prostate cancer (HCC)    Skin cancer    skin cancer removed from left  arm and back    Family History  Problem Relation Age of Onset   Cancer Mother        colon   Heart failure Father    Cancer Father        lung   Heart attack Father    Heart disease Brother    Prostate cancer Brother    Colon cancer Maternal Grandmother    Healthy Daughter    Healthy Daughter    Healthy Daughter    Stroke Neg Hx    Esophageal cancer Neg Hx    Rectal cancer Neg Hx    Stomach cancer Neg Hx    Liver disease Neg Hx    Past Surgical History:  Procedure Laterality Date   BACK SURGERY  12/31/1977   for degenerative disc disease   COLONOSCOPY     COLONOSCOPY WITH ESOPHAGOGASTRODUODENOSCOPY (EGD)     FLEXIBLE SIGMOIDOSCOPY N/A 04/25/2023   Procedure: FLEXIBLE SIGMOIDOSCOPY;  Surgeon: Iva Boop, MD;  Location: [facility];  Service: Gastroenterology;  Laterality: N/A;   HOT HEMOSTASIS N/A 04/25/2023   Procedure: HOT HEMOSTASIS (ARGON PLASMA COAGULATION/BICAP);  Surgeon: Iva Boop, MD;  Location: [facility];  Service: Gastroenterology;  Laterality: N/A;   IR RADIOLOGIST EVAL & MGMT  01/16/2022   IR RADIOLOGIST EVAL & MGMT  06/04/2022   IR RADIOLOGIST EVAL & MGMT  08/05/2023   PROSTATE BIOPSY     RADIOLOGY WITH ANESTHESIA N/A 02/21/2022   Procedure: CT WITH ANESTHESIA CRYOABLATION;  Surgeon: Irish Lack, MD;  Location: [facility];  Service: Radiology;  Laterality: N/A;   TEE WITHOUT CARDIOVERSION N/A 03/04/2019   Procedure: TRANSESOPHAGEAL ECHOCARDIOGRAM (TEE);  Surgeon: Vesta Mixer, MD;  Location: [facility] ENDOSCOPY;  Service: Cardiovascular;  Laterality: N/A;   toe removed     Right second toe   TRANSESOPHAGEAL ECHOCARDIOGRAM (CATH LAB) N/A 12/05/2023   Procedure: TRANSESOPHAGEAL ECHOCARDIOGRAM;  Surgeon: Christell Constant, MD;  Location: [facility] INVASIVE CV LAB;  Service: Cardiovascular;  Laterality: N/A;   UPPER GASTROINTESTINAL ENDOSCOPY     Social History   Social History Narrative   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  They share three daughters, two live local, one in [place]   09-26-18 Unable to ask abuse questions wife and daughter with him today.   Immunization History  Administered Date(s) Administered   Moderna Covid-19 Fall Seasonal Vaccine 61yrs & older 03/15/2023   PFIZER Comirnaty(Gray Top)Covid-19 Tri-Sucrose Vaccine 04/06/2021   PFIZER(Purple Top)SARS-COV-2 Vaccination 02/04/2020, 02/25/2020, 09/26/2020   Pfizer(Comirnaty)Fall Seasonal Vaccine 12 years and older 10/04/2022     Objective: Vital Signs: BP 107/67 (BP Location: Left Arm, Patient Position: Sitting, Cuff Size: Normal)   Pulse (!) 56   Resp 12   Ht 5\' 9"  (1.753 m)   Wt 181 lb (82.1 kg)   BMI 26.73 kg/m    Physical Exam Vitals and nursing note reviewed.  Constitutional:      Appearance: He is well-developed.  HENT:     Head: Normocephalic and atraumatic.  Eyes:     Conjunctiva/sclera: Conjunctivae normal.     Pupils: Pupils are equal, round, and reactive to light.  Cardiovascular:     Rate and Rhythm: Normal rate and regular rhythm.     Heart sounds: Normal heart sounds.  Pulmonary:     Effort: Pulmonary effort is normal.     Breath sounds: Normal breath sounds.  Abdominal:     General: Bowel sounds are normal.     Palpations: Abdomen is soft.  Musculoskeletal:     Cervical back: Normal range of motion and neck supple.  Skin:    General: Skin is warm and dry.     Capillary Refill: Capillary refill takes less than 2 seconds.  Neurological:     Mental Status: He is alert and oriented to person, place, and time.  Psychiatric:        Behavior: Behavior normal.      Musculoskeletal Exam: Patient remained seated during the examination today.  C-spine has slightly limited ROM.  Thoracic kyphosis noted.  Shoulder joints have good ROM.  Tophi on extensor surface of both elbows.  Tophi overlying both hands. CMC, PIP, and DIP thickening consistent with osteoarthritis of both hands. Knee joints have good ROM with no  warmth or effusion.  Ankle joints have good ROM with no tenderness or joint swelling.   CDAI Exam: CDAI Score: -- Patient Global: --; Provider Global: -- Swollen: --; Tender: -- Joint Exam 03/17/2024   No joint exam has been documented for this visit   There is currently no information documented on the homunculus. Go to the Rheumatology activity and complete the homunculus joint exam.  Investigation: No additional findings.  Imaging: No results found.   Recent Labs: Lab Results  Component Value Date   WBC 6.9 02/03/2024   HGB 11.3 (L) 02/03/2024   PLT 177 02/03/2024   NA 143 02/03/2024   K 4.7 02/03/2024   CL 105 02/03/2024   CO2 27 02/03/2024   GLUCOSE 77 02/03/2024   BUN 48 (H) 02/03/2024   CREATININE 1.67 (H) 02/03/2024   BILITOT 0.5 02/03/2024   AST 25 02/03/2024   ALT 18 02/03/2024   PROT 6.7 02/03/2024   CALCIUM 9.3 02/03/2024   GFRAA 72 02/23/2019    Speciality Comments: No specialty comments available.  Procedures:  No procedures performed Allergies: Amlodipine, Lumigan [bimatoprost], and Lisinopril     Assessment / Plan:     Visit Diagnoses: Idiopathic chronic gout of multiple sites with tophus - Hx of tophaceous  gout several years ago, CKD, Hyperuricemia, Tophi present bilateral hands-started several months ago, voltaren gel PRN, colchicine PRN: Patient presented for the initial consultation on 02/03/2024 at which time he was taking allopurinol 100 mg daily.  There was concern for interaction between warfarin and allopurinol especially since he had ongoing GI bleeding.  Reviewed Dr. Lovena Neighbours office visit note from 02/05/24: if the patient agrees to proceed with the watchman procedure-he will require short-term anticoagulation after the implant.  Patient has been transitioned from warfarin to eliquis but continues to have intermittent GI bleeding.  Patient had been holding allopurinol until resuming on Sunday.  He has not needed to take colchicine recently.   He takes Tylenol as needed for pain relief.  He has noticed some increase gas and loose stools since reinitiating allopurinol 100 mg daily on sunday.  His uric acid level was 7.2 on 02/03/2024.  Plan to recheck uric acid level today.  Discussed that he can reduce allopurinol to 50  mg daily for several days to see if the GI symptoms improve.  Discussed that the goal range for his uric acid is less than 4 to help dissolve tophi and minimize risk for flares.  We will need to gradually increase the dose of allopurinol to prevent flares as well as to assess for tolerability. He has not a good candidate for the use of probenecid given low GFR.  There is a concern for use of Uloric given significant cardiovascular history.  As previously discussed he may benefit from switching from olmesartan to losartan, which has shown to also reduce uric acid levels.  He has stopped eating sardines and has been avoiding a purine rich diet as encouraged. He continues to have chronic stiffness in both hands and intermittent discomfort especially after performing yard work.  No active inflammation was noted on examination today.  He was given a handout of hand exercises.   Plan to check CBC, CMP, and uric acid level today and dose adjustments will be made pending results. He will continue to require close follow-6 weeks or sooner if needed.   Plan: Uric acid, COMPLETE METABOLIC PANEL WITH GFR, CBC with Differential/Platelet  Medication monitoring encounter - CBC and CMP updated on 02/03/24. Plan to recheck CBC, CMP, and uric acid today.  Plan: Uric acid, COMPLETE METABOLIC PANEL WITH GFR, CBC with Differential/Platelet  Long term (Love) use of anticoagulants: Patient has been switched from warfarin to Eliquis by Dr. Lalla Brothers.  If the patient decides to proceed with the Watchman procedure left atrial appendage closure device he will likely only require short-term anticoagulation after the implant.   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   Follow-Up Instructions: Return in about 6 weeks (around 04/28/2024) for Gout.   Gearldine Bienenstock, PA-C  Note - This record has been created using Dragon software.  Chart creation errors have been sought, but may not always  have been located. Such creation errors do not reflect on  the standard of medical care.

## 2024-03-06 DIAGNOSIS — N184 Chronic kidney disease, stage 4 (severe): Secondary | ICD-10-CM | POA: Diagnosis not present

## 2024-03-10 DIAGNOSIS — I50810 Right heart failure, unspecified: Secondary | ICD-10-CM | POA: Diagnosis not present

## 2024-03-10 DIAGNOSIS — N1832 Chronic kidney disease, stage 3b: Secondary | ICD-10-CM | POA: Diagnosis not present

## 2024-03-10 DIAGNOSIS — I5030 Unspecified diastolic (congestive) heart failure: Secondary | ICD-10-CM | POA: Diagnosis not present

## 2024-03-10 DIAGNOSIS — M1A9XX1 Chronic gout, unspecified, with tophus (tophi): Secondary | ICD-10-CM | POA: Diagnosis not present

## 2024-03-10 DIAGNOSIS — R609 Edema, unspecified: Secondary | ICD-10-CM | POA: Diagnosis not present

## 2024-03-17 ENCOUNTER — Ambulatory Visit: Payer: Medicare Other | Attending: Physician Assistant | Admitting: Physician Assistant

## 2024-03-17 ENCOUNTER — Encounter: Payer: Self-pay | Admitting: Physician Assistant

## 2024-03-17 VITALS — BP 107/67 | HR 56 | Resp 12 | Ht 69.0 in | Wt 181.0 lb

## 2024-03-17 DIAGNOSIS — I080 Rheumatic disorders of both mitral and aortic valves: Secondary | ICD-10-CM

## 2024-03-17 DIAGNOSIS — I119 Hypertensive heart disease without heart failure: Secondary | ICD-10-CM | POA: Diagnosis not present

## 2024-03-17 DIAGNOSIS — M1A09X1 Idiopathic chronic gout, multiple sites, with tophus (tophi): Secondary | ICD-10-CM

## 2024-03-17 DIAGNOSIS — I7121 Aneurysm of the ascending aorta, without rupture: Secondary | ICD-10-CM | POA: Diagnosis not present

## 2024-03-17 DIAGNOSIS — I361 Nonrheumatic tricuspid (valve) insufficiency: Secondary | ICD-10-CM

## 2024-03-17 DIAGNOSIS — I351 Nonrheumatic aortic (valve) insufficiency: Secondary | ICD-10-CM | POA: Diagnosis not present

## 2024-03-17 DIAGNOSIS — Z5181 Encounter for therapeutic drug level monitoring: Secondary | ICD-10-CM

## 2024-03-17 DIAGNOSIS — I5032 Chronic diastolic (congestive) heart failure: Secondary | ICD-10-CM | POA: Diagnosis not present

## 2024-03-17 DIAGNOSIS — C61 Malignant neoplasm of prostate: Secondary | ICD-10-CM | POA: Diagnosis not present

## 2024-03-17 DIAGNOSIS — I340 Nonrheumatic mitral (valve) insufficiency: Secondary | ICD-10-CM | POA: Diagnosis not present

## 2024-03-17 DIAGNOSIS — Z7901 Long term (current) use of anticoagulants: Secondary | ICD-10-CM

## 2024-03-17 DIAGNOSIS — K627 Radiation proctitis: Secondary | ICD-10-CM | POA: Diagnosis not present

## 2024-03-17 DIAGNOSIS — D6869 Other thrombophilia: Secondary | ICD-10-CM | POA: Diagnosis not present

## 2024-03-17 DIAGNOSIS — I4821 Permanent atrial fibrillation: Secondary | ICD-10-CM

## 2024-03-17 NOTE — Patient Instructions (Signed)

## 2024-03-18 LAB — CBC WITH DIFFERENTIAL/PLATELET
Absolute Lymphocytes: 708 {cells}/uL — ABNORMAL LOW (ref 850–3900)
Absolute Monocytes: 732 {cells}/uL (ref 200–950)
Basophils Absolute: 30 {cells}/uL (ref 0–200)
Basophils Relative: 0.5 %
Eosinophils Absolute: 230 {cells}/uL (ref 15–500)
Eosinophils Relative: 3.9 %
HCT: 34.4 % — ABNORMAL LOW (ref 38.5–50.0)
Hemoglobin: 11.2 g/dL — ABNORMAL LOW (ref 13.2–17.1)
MCH: 29.9 pg (ref 27.0–33.0)
MCHC: 32.6 g/dL (ref 32.0–36.0)
MCV: 91.7 fL (ref 80.0–100.0)
MPV: 9.7 fL (ref 7.5–12.5)
Monocytes Relative: 12.4 %
Neutro Abs: 4201 {cells}/uL (ref 1500–7800)
Neutrophils Relative %: 71.2 %
Platelets: 165 10*3/uL (ref 140–400)
RBC: 3.75 10*6/uL — ABNORMAL LOW (ref 4.20–5.80)
RDW: 13.9 % (ref 11.0–15.0)
Total Lymphocyte: 12 %
WBC: 5.9 10*3/uL (ref 3.8–10.8)

## 2024-03-18 LAB — COMPLETE METABOLIC PANEL WITH GFR
AG Ratio: 1.6 (calc) (ref 1.0–2.5)
ALT: 11 U/L (ref 9–46)
AST: 18 U/L (ref 10–35)
Albumin: 4.1 g/dL (ref 3.6–5.1)
Alkaline phosphatase (APISO): 67 U/L (ref 35–144)
BUN/Creatinine Ratio: 24 (calc) — ABNORMAL HIGH (ref 6–22)
BUN: 54 mg/dL — ABNORMAL HIGH (ref 7–25)
CO2: 28 mmol/L (ref 20–32)
Calcium: 9.5 mg/dL (ref 8.6–10.3)
Chloride: 107 mmol/L (ref 98–110)
Creat: 2.26 mg/dL — ABNORMAL HIGH (ref 0.70–1.22)
Globulin: 2.5 g/dL (ref 1.9–3.7)
Glucose, Bld: 95 mg/dL (ref 65–99)
Potassium: 4.5 mmol/L (ref 3.5–5.3)
Sodium: 144 mmol/L (ref 135–146)
Total Bilirubin: 0.4 mg/dL (ref 0.2–1.2)
Total Protein: 6.6 g/dL (ref 6.1–8.1)

## 2024-03-18 LAB — URIC ACID: Uric Acid, Serum: 9.1 mg/dL — ABNORMAL HIGH (ref 4.0–8.0)

## 2024-03-18 NOTE — Progress Notes (Signed)
 I attempted to call the patient to discuss lab results.  Uric acid is elevated-9.1. Creatinine is elevated-2.26 and GFR is very low-24.  Patient remains anemic-stable.  Please forward results to nephrology.  Recommend continuing allopurinol 100 mg daily. Repeat lab work in 2-3 weeks.

## 2024-03-19 ENCOUNTER — Other Ambulatory Visit: Payer: Self-pay | Admitting: *Deleted

## 2024-03-19 DIAGNOSIS — Z5181 Encounter for therapeutic drug level monitoring: Secondary | ICD-10-CM

## 2024-03-19 DIAGNOSIS — M1A09X1 Idiopathic chronic gout, multiple sites, with tophus (tophi): Secondary | ICD-10-CM

## 2024-04-06 DIAGNOSIS — M1A39X1 Chronic gout due to renal impairment, multiple sites, with tophus (tophi): Secondary | ICD-10-CM | POA: Diagnosis not present

## 2024-04-06 DIAGNOSIS — N184 Chronic kidney disease, stage 4 (severe): Secondary | ICD-10-CM | POA: Diagnosis not present

## 2024-04-13 DIAGNOSIS — N184 Chronic kidney disease, stage 4 (severe): Secondary | ICD-10-CM | POA: Diagnosis not present

## 2024-04-13 DIAGNOSIS — D509 Iron deficiency anemia, unspecified: Secondary | ICD-10-CM | POA: Diagnosis not present

## 2024-04-13 DIAGNOSIS — I10 Essential (primary) hypertension: Secondary | ICD-10-CM | POA: Diagnosis not present

## 2024-04-13 DIAGNOSIS — M1A39X1 Chronic gout due to renal impairment, multiple sites, with tophus (tophi): Secondary | ICD-10-CM | POA: Diagnosis not present

## 2024-04-13 DIAGNOSIS — C61 Malignant neoplasm of prostate: Secondary | ICD-10-CM | POA: Diagnosis not present

## 2024-04-13 DIAGNOSIS — I080 Rheumatic disorders of both mitral and aortic valves: Secondary | ICD-10-CM | POA: Diagnosis not present

## 2024-04-13 DIAGNOSIS — D500 Iron deficiency anemia secondary to blood loss (chronic): Secondary | ICD-10-CM | POA: Diagnosis not present

## 2024-04-13 DIAGNOSIS — I4821 Permanent atrial fibrillation: Secondary | ICD-10-CM | POA: Diagnosis not present

## 2024-04-14 NOTE — Progress Notes (Unsigned)
 Office Visit Note  Patient: Daniel Love             Date of Birth: 1940/02/01           MRN: [id number]             PCP: Davis Esters Referring: Orvis Blare, [place]* Visit Date: 04/28/2024 Occupation: @GUAROCC @  Subjective:  Medication monitoring   History of Present Illness: Daniel Love is a 84 y.o. male with history of gout.  Patient is currently taking allopurinol 100 mg 1 tablet by mouth daily.  He has been tolerating allopurinol without any side effects and has remained on allopurinol consistently since his last office visit on 03/17/2024.  Patient states he has had about a 50% improvement in his hand pain and stiffness since initiating allopurinol.  He has been able to use his hands with less difficulty including using buttons and writing.  He denies any joint swelling at this time.  He denies any other joint pain or joint swelling at this time.     Activities of Daily Living:  Patient reports morning stiffness for few minutes.   Patient Denies nocturnal pain.  Difficulty dressing/grooming: Denies Difficulty climbing stairs: Reports Difficulty getting out of chair: Reports Difficulty using hands for taps, buttons, cutlery, and/or writing: Reports  Review of Systems  Constitutional:  Positive for fatigue.  HENT:  Negative for mouth sores and mouth dryness.   Eyes:  Negative for dryness.  Respiratory:  Negative for shortness of breath.   Cardiovascular:  Negative for chest pain and palpitations.  Gastrointestinal:  Positive for blood in stool. Negative for constipation and diarrhea.  Endocrine: Negative for increased urination.  Genitourinary:  Negative for involuntary urination.  Musculoskeletal:  Positive for joint pain, gait problem, joint pain, joint swelling, myalgias, muscle weakness, morning stiffness and myalgias. Negative for muscle tenderness.  Skin:  Negative for color change, rash, hair loss and sensitivity to sunlight.   Allergic/Immunologic: Negative for susceptible to infections.  Neurological:  Negative for dizziness and headaches.  Hematological:  Negative for swollen glands.  Psychiatric/Behavioral:  Positive for depressed mood. Negative for sleep disturbance. The patient is not nervous/anxious.     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 04/15/2023   Thoracic aortic aneurysm (HCC) 01/16/2023   Chronic heart failure with preserved ejection fraction (HCC) 01/15/2023   Acquired thrombophilia (HCC) 12/12/2022   Mitral regurgitation and aortic stenosis 07/26/2022   Nonrheumatic tricuspid valve regurgitation 07/26/2022   Malignant neoplasm of prostate (HCC) 04/09/2018   Mitral valve insufficiency 02/26/2018   Benign hypertensive heart disease without heart failure 05/14/2013    Past Medical History:  Diagnosis Date   Anal fissure    hx of    Arthritis    Atrial fibrillation (HCC)    maintaining normal sinus rhythm   Chronic anticoagulation    coumadin    Dyspepsia    Dysrhythmia    Afib   GERD (gastroesophageal reflux disease)    Glaucoma    History of colon polyps 2012   Colonoscopy-Dr. Nickey Barn    History of echocardiogram    Echocardiogram 2/19: EF 60-65, normal wall motion, trivial AI, moderate to severe MR, mild LAE, mild RAE, mild to moderate TR, PASP 35   History of kidney stones    History of nuclear stress test    Myoview  2/19: EF 42,  diffuse HK no significant ischemia, apical lateral scar   Hypertension    Prostate cancer (HCC)    Skin cancer    skin cancer removed from left arm and back    Family History  Problem Relation Age of Onset   Cancer Mother        colon   Heart failure Father    Cancer Father        lung   Heart attack Father    Heart disease Brother    Prostate cancer Brother    Colon cancer Maternal Grandmother    Healthy  Daughter    Healthy Daughter    Healthy Daughter    Stroke Neg Hx    Esophageal cancer Neg Hx    Rectal cancer Neg Hx    Stomach cancer Neg Hx    Liver disease Neg Hx    Past Surgical History:  Procedure Laterality Date   BACK SURGERY  12/31/1977   for degenerative disc disease   COLONOSCOPY     COLONOSCOPY WITH ESOPHAGOGASTRODUODENOSCOPY (EGD)     FLEXIBLE SIGMOIDOSCOPY N/A 04/25/2023   Procedure: FLEXIBLE SIGMOIDOSCOPY;  Surgeon: Kenney Peacemaker, MD;  Location: [facility];  Service: Gastroenterology;  Laterality: N/A;   HOT HEMOSTASIS N/A 04/25/2023   Procedure: HOT HEMOSTASIS (ARGON PLASMA COAGULATION/BICAP);  Surgeon: Kenney Peacemaker, MD;  Location: [facility];  Service: Gastroenterology;  Laterality: N/A;   IR RADIOLOGIST EVAL & MGMT  01/16/2022   IR RADIOLOGIST EVAL & MGMT  06/04/2022   IR RADIOLOGIST EVAL & MGMT  08/05/2023   PROSTATE BIOPSY     RADIOLOGY WITH ANESTHESIA N/A 02/21/2022   Procedure: CT WITH ANESTHESIA CRYOABLATION;  Surgeon: Erica Hau, MD;  Location: [facility];  Service: Radiology;  Laterality: N/A;   TEE WITHOUT CARDIOVERSION N/A 03/04/2019   Procedure: TRANSESOPHAGEAL ECHOCARDIOGRAM (TEE);  Surgeon: Lake Pilgrim, MD;  Location: [facility] ENDOSCOPY;  Service: Cardiovascular;  Laterality: N/A;   toe removed     Right second toe   TRANSESOPHAGEAL ECHOCARDIOGRAM (CATH LAB) N/A 12/05/2023   Procedure: TRANSESOPHAGEAL ECHOCARDIOGRAM;  Surgeon: Jann Melody, MD;  Location: [facility] INVASIVE CV LAB;  Service: Cardiovascular;  Laterality: N/A;   UPPER GASTROINTESTINAL ENDOSCOPY     Social History   Social History Narrative   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 They share three daughters, two live local, one in [place]    09-26-18 Unable to ask abuse questions wife and daughter with him today.   Immunization History  Administered Date(s) Administered   Moderna Covid-19 Fall Seasonal Vaccine 1yrs & older 03/15/2023   PFIZER  Comirnaty(Gray Top)Covid-19 Tri-Sucrose Vaccine 04/06/2021   PFIZER(Purple Top)SARS-COV-2 Vaccination 02/04/2020, 02/25/2020, 09/26/2020   Pfizer(Comirnaty)Fall Seasonal Vaccine 12 years and older 10/04/2022     Objective: Vital Signs: BP 111/68 (BP Location: Left Arm, Patient Position: Sitting, Cuff Size: Normal)   Pulse 64   Resp 15   Ht 5\' 9"  (1.753 m)   Wt 183 lb 6.4 oz (83.2 kg)   BMI 27.08 kg/m    Physical Exam Vitals and nursing note reviewed.  Constitutional:      Appearance: He is well-developed.  HENT:     Head: Normocephalic and atraumatic.  Eyes:     Conjunctiva/sclera: Conjunctivae normal.     Pupils: Pupils are equal, round, and reactive to light.  Cardiovascular:     Rate and Rhythm: Normal rate and regular rhythm.     Heart sounds: Normal heart sounds.  Pulmonary:  Effort: Pulmonary effort is normal.     Breath sounds: Normal breath sounds.  Abdominal:     General: Bowel sounds are normal.     Palpations: Abdomen is soft.  Musculoskeletal:     Cervical back: Normal range of motion and neck supple.  Skin:    General: Skin is warm and dry.     Capillary Refill: Capillary refill takes less than 2 seconds.  Neurological:     Mental Status: He is alert and oriented to person, place, and time.  Psychiatric:        Behavior: Behavior normal.      Musculoskeletal Exam: Patient mains seated during the examination today.  C-spine has slightly limited range of motion without rotation.  Thoracic kyphosis noted.  Shoulder joints have good range of motion with no discomfort.  No tenderness along the elbow joint line.  Tophi noted on the extensor surface of the right elbow.  Tophi noted overlying both hands.  CMC, PIP, DIP thickening consistent with osteoarthritis of both hands.  No synovitis noted.  Knee joints have good range of motion no warmth or effusion.  Ankle joints have good range of motion with no tenderness or joint swelling.  CDAI Exam: CDAI Score:  -- Patient Global: --; Provider Global: -- Swollen: --; Tender: -- Joint Exam 04/28/2024   No joint exam has been documented for this visit   There is currently no information documented on the homunculus. Go to the Rheumatology activity and complete the homunculus joint exam.  Investigation: No additional findings.  Imaging: No results found.  Recent Labs: Lab Results  Component Value Date   WBC 5.9 03/17/2024   HGB 11.2 (L) 03/17/2024   PLT 165 03/17/2024   NA 144 03/17/2024   K 4.5 03/17/2024   CL 107 03/17/2024   CO2 28 03/17/2024   GLUCOSE 95 03/17/2024   BUN 54 (H) 03/17/2024   CREATININE 2.26 (H) 03/17/2024   BILITOT 0.4 03/17/2024   AST 18 03/17/2024   ALT 11 03/17/2024   PROT 6.6 03/17/2024   CALCIUM 9.5 03/17/2024   GFRAA 72 02/23/2019    Speciality Comments: No specialty comments available.  Procedures:  No procedures performed Allergies: Amlodipine , Lumigan [bimatoprost], and Lisinopril     Assessment / Plan:     Visit Diagnoses: Idiopathic chronic gout of multiple sites with tophus - Hx of tophaceous  gout several years ago, CKD, Hyperuricemia, Tophi present bilateral hands-started several months ago, voltaren gel PRN, colchicine PRN: Patient is currently taking allopurinol 100 mg 1 tablet by mouth daily.  He is tolerating allopurinol without any side effects and has not had any interruptions in therapy since his last office visit on 03/17/2024.  He has noticed about a 50% improvement in the joint pain, stiffness, and inflammation involving both hands while taking allopurinol consistently.  He has had less difficulty performing ADLs and fine motor skills while taking allopurinol.  He has no active inflammation on examination today.  Tophi appear unchanged.  He has not needed to take colchicine recently. Uric acid was 9.1 on 03/17/2024 at which time he was not taking allopurinol.  Plan to recheck uric acid along with CMP with GFR.  Any dose changes will be  made pending lab results. He was given a handout of hand exercises to perform.  He will follow up in 8 weeks or sooner if needed.  Plan: Uric acid  Medication monitoring encounter -Allopurinol 100 mg 1 tablet by mouth daily. Plan to update CMP  and uric acid level today.  Uric acid was 9.1 on 03/17/24.   : Comprehensive metabolic panel with GFR, Uric acid  Long term (current) use of anticoagulants - Switched from warfarin to Eliquis  by Dr. Marven Slimmer. Watchman procedure left atrial appendage-likely only require short-term anticoagulation after the implant.  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    Follow-Up Instructions: Return in about 2 months (around 06/28/2024) for Gout.   Romayne Clubs, PA-C  Note - This record has been created using Dragon software.  Chart creation errors have been sought, but may not always  have been located. Such creation errors do not reflect on  the standard of medical care.

## 2024-04-21 DIAGNOSIS — N184 Chronic kidney disease, stage 4 (severe): Secondary | ICD-10-CM | POA: Diagnosis not present

## 2024-04-23 DIAGNOSIS — D62 Acute posthemorrhagic anemia: Secondary | ICD-10-CM | POA: Diagnosis not present

## 2024-04-23 DIAGNOSIS — D500 Iron deficiency anemia secondary to blood loss (chronic): Secondary | ICD-10-CM | POA: Diagnosis not present

## 2024-04-23 DIAGNOSIS — N184 Chronic kidney disease, stage 4 (severe): Secondary | ICD-10-CM | POA: Diagnosis not present

## 2024-04-23 DIAGNOSIS — D631 Anemia in chronic kidney disease: Secondary | ICD-10-CM | POA: Diagnosis not present

## 2024-04-23 DIAGNOSIS — D509 Iron deficiency anemia, unspecified: Secondary | ICD-10-CM | POA: Diagnosis not present

## 2024-04-28 ENCOUNTER — Encounter: Payer: Self-pay | Admitting: Physician Assistant

## 2024-04-28 ENCOUNTER — Ambulatory Visit: Attending: Physician Assistant | Admitting: Physician Assistant

## 2024-04-28 VITALS — BP 111/68 | HR 64 | Resp 15 | Ht 69.0 in | Wt 183.4 lb

## 2024-04-28 DIAGNOSIS — Z7901 Long term (current) use of anticoagulants: Secondary | ICD-10-CM | POA: Insufficient documentation

## 2024-04-28 DIAGNOSIS — I351 Nonrheumatic aortic (valve) insufficiency: Secondary | ICD-10-CM | POA: Insufficient documentation

## 2024-04-28 DIAGNOSIS — I119 Hypertensive heart disease without heart failure: Secondary | ICD-10-CM | POA: Insufficient documentation

## 2024-04-28 DIAGNOSIS — I5032 Chronic diastolic (congestive) heart failure: Secondary | ICD-10-CM | POA: Diagnosis not present

## 2024-04-28 DIAGNOSIS — Z5181 Encounter for therapeutic drug level monitoring: Secondary | ICD-10-CM | POA: Insufficient documentation

## 2024-04-28 DIAGNOSIS — D6869 Other thrombophilia: Secondary | ICD-10-CM | POA: Insufficient documentation

## 2024-04-28 DIAGNOSIS — K627 Radiation proctitis: Secondary | ICD-10-CM | POA: Insufficient documentation

## 2024-04-28 DIAGNOSIS — I7121 Aneurysm of the ascending aorta, without rupture: Secondary | ICD-10-CM | POA: Insufficient documentation

## 2024-04-28 DIAGNOSIS — C61 Malignant neoplasm of prostate: Secondary | ICD-10-CM | POA: Diagnosis not present

## 2024-04-28 DIAGNOSIS — M1A09X1 Idiopathic chronic gout, multiple sites, with tophus (tophi): Secondary | ICD-10-CM | POA: Insufficient documentation

## 2024-04-28 DIAGNOSIS — I361 Nonrheumatic tricuspid (valve) insufficiency: Secondary | ICD-10-CM | POA: Diagnosis not present

## 2024-04-28 DIAGNOSIS — I340 Nonrheumatic mitral (valve) insufficiency: Secondary | ICD-10-CM | POA: Diagnosis not present

## 2024-04-28 DIAGNOSIS — I4821 Permanent atrial fibrillation: Secondary | ICD-10-CM | POA: Insufficient documentation

## 2024-04-28 DIAGNOSIS — I080 Rheumatic disorders of both mitral and aortic valves: Secondary | ICD-10-CM | POA: Insufficient documentation

## 2024-04-28 NOTE — Patient Instructions (Signed)

## 2024-04-29 LAB — COMPREHENSIVE METABOLIC PANEL WITH GFR
AG Ratio: 2 (calc) (ref 1.0–2.5)
ALT: 11 U/L (ref 9–46)
AST: 20 U/L (ref 10–35)
Albumin: 4.3 g/dL (ref 3.6–5.1)
Alkaline phosphatase (APISO): 61 U/L (ref 35–144)
BUN/Creatinine Ratio: 26 (calc) — ABNORMAL HIGH (ref 6–22)
BUN: 55 mg/dL — ABNORMAL HIGH (ref 7–25)
CO2: 26 mmol/L (ref 20–32)
Calcium: 9.3 mg/dL (ref 8.6–10.3)
Chloride: 106 mmol/L (ref 98–110)
Creat: 2.11 mg/dL — ABNORMAL HIGH (ref 0.70–1.22)
Globulin: 2.2 g/dL (ref 1.9–3.7)
Glucose, Bld: 88 mg/dL (ref 65–99)
Potassium: 4.4 mmol/L (ref 3.5–5.3)
Sodium: 142 mmol/L (ref 135–146)
Total Bilirubin: 0.4 mg/dL (ref 0.2–1.2)
Total Protein: 6.5 g/dL (ref 6.1–8.1)
eGFR: 30 mL/min/{1.73_m2} — ABNORMAL LOW (ref >=60)

## 2024-04-29 LAB — URIC ACID: Uric Acid, Serum: 7.6 mg/dL (ref 4.0–8.0)

## 2024-04-29 NOTE — Progress Notes (Signed)
 Uric acid has improved but is still not within goal range--7.6.   Creatinine remains elevated-2.11 and GFR low-30. Re Reviewed with Dr. Kenny Peals to try increasing allopurinol to 150 mg daily x1 week and if tolerated and labs are stable may be able to increase to 200 mg    Please advise patient return for lab work 1 week after each dose increase.   If he has increased diarrhea he can go back to 100 mg daily.

## 2024-05-04 DIAGNOSIS — C61 Malignant neoplasm of prostate: Secondary | ICD-10-CM | POA: Diagnosis not present

## 2024-05-04 DIAGNOSIS — N184 Chronic kidney disease, stage 4 (severe): Secondary | ICD-10-CM | POA: Diagnosis not present

## 2024-05-04 NOTE — Progress Notes (Unsigned)
  Electrophysiology Office Note:   Date:  05/05/2024  ID:  Daniel Love, DOB Feb 04, 1940, MRN [id number]  Primary Cardiologist: Jann Melody, MD Electrophysiologist: Boyce Byes, MD      History of Present Illness:   Daniel Love is a 84 y.o. male with h/o permanent AF, CKD IV, and HFpEF seen today for routine electrophysiology followup.   Seen by Dr. Marven Slimmer 02/05/2024 for watchman discussion. Transitioned from coumadin  to Eliquis  with plans to consider LAAO if he continued to have issues, as he was felt to be a good candidate.   Since last being seen in our clinic the patient reports doing OK. He has continued to have some fatigue potentially contributed to anemia, as well as blood in his bowel movements at times. Denies SOB or CP. No syncope. He has wavered back and forth about Watchman, but his family are encouraging him to consider.   Review of systems complete and found to be negative unless listed in HPI.   EP Information / Studies Reviewed:    EKG is ordered today. Personal review as below.  EKG Interpretation Date/Time:  Tuesday May 05 2024 11:30:21 EDT Ventricular Rate:  51 PR Interval:    QRS Duration:  118 QT Interval:  484 QTC Calculation: 446 R Axis:   -27  Text Interpretation: Atrial fibrillation with slow ventricular response Low voltage QRS Right bundle branch block When compared with ECG of 25-Oct-2023 11:44, No significant change was found Confirmed by Pilar Bridge [phone]) on 05/05/2024 11:33:53 AM    Arrhythmia/Device History No specialty comments available.   Physical Exam:   VS:  BP 100/68   Pulse (!) 56   Ht 5\' 9"  (1.753 m)   Wt 182 lb 9.6 oz (82.8 kg)   SpO2 99%   BMI 26.97 kg/m    Wt Readings from Last 3 Encounters:  05/05/24 182 lb 9.6 oz (82.8 kg)  04/28/24 183 lb 6.4 oz (83.2 kg)  03/17/24 181 lb (82.1 kg)     GEN: No acute distress NECK: No JVD; No carotid bruits CARDIAC: Irregularly irregular rate and rhythm, no  murmurs, rubs, gallops RESPIRATORY:  Clear to auscultation without rales, wheezing or rhonchi  ABDOMEN: Soft, non-tender, non-distended EXTREMITIES:  No edema; No deformity   ASSESSMENT AND PLAN:    Permanent AF EKG today shows stable HR Tolerating Eliquis , but still having intermittent GI bleeding, as well as anemia with symptoms of fatigue.  He discussed further with his daughter in room, and would like to go ahead and schedule Watchman.  They will have further family discussions, but he is of mind to proceed.  Re-reviewed risks and benefits, and will reach out to coordinator for next available date.   CKD IV Would need TEE, not CT if proceed  HFpEF Volume status stable on exam.  HTN Stable on current regimen   Symptomatic anemia GI bleeding Would like to be able to come off OAC as above.   Follow up with me or Dr. Marven Slimmer once Watchman is scheduled for pre-op visit.   Signed, Tylene Galla, PA-C

## 2024-05-05 ENCOUNTER — Encounter: Payer: Self-pay | Admitting: Student

## 2024-05-05 ENCOUNTER — Ambulatory Visit: Payer: Medicare Other | Attending: Student | Admitting: Student

## 2024-05-05 ENCOUNTER — Ambulatory Visit: Payer: Medicare Other | Admitting: Internal Medicine

## 2024-05-05 VITALS — BP 100/68 | HR 56 | Ht 69.0 in | Wt 182.6 lb

## 2024-05-05 DIAGNOSIS — I5032 Chronic diastolic (congestive) heart failure: Secondary | ICD-10-CM

## 2024-05-05 DIAGNOSIS — Z7901 Long term (current) use of anticoagulants: Secondary | ICD-10-CM | POA: Diagnosis not present

## 2024-05-05 DIAGNOSIS — I4821 Permanent atrial fibrillation: Secondary | ICD-10-CM

## 2024-05-05 NOTE — Patient Instructions (Signed)
 Medication Instructions:  Your physician recommends that you continue on your current medications as directed. Please refer to the Current Medication list given to you today.  *If you need a refill on your cardiac medications before your next appointment, please call your pharmacy*  Lab Work: None ordered If you have labs (blood work) drawn today and your tests are completely normal, you will receive your results only by: MyChart Message (if you have MyChart) OR A paper copy in the mail If you have any lab test that is abnormal or we need to change your treatment, we will call you to review the results.  Follow-Up: At [facility], you and your health needs are our priority.  As part of our continuing mission to provide you with exceptional heart care, our providers are all part of one team.  This team includes your primary Cardiologist (physician) and Advanced Practice Providers or APPs (Physician Assistants and Nurse Practitioners) who all work together to provide you with the care you need, when you need it.  Your next appointment:   To be determined, Acie Holiday will be in touch.

## 2024-05-06 ENCOUNTER — Other Ambulatory Visit: Payer: Self-pay

## 2024-05-06 DIAGNOSIS — I4821 Permanent atrial fibrillation: Secondary | ICD-10-CM

## 2024-05-07 ENCOUNTER — Telehealth: Payer: Self-pay

## 2024-05-07 NOTE — Telephone Encounter (Signed)
 Called to confirm LAAO plan. The patient will have pre-procedure check-up on 08/17/2024 in preparation for LAAO on 08/27/2024.  Left message to call back.

## 2024-05-11 NOTE — Telephone Encounter (Signed)
 Confirmed follow-up appointment on 08/17/2024 and Watchman procedure date of 08/17/2024. Daniel Love was grateful for call and agreed with plan.

## 2024-05-20 DIAGNOSIS — H401132 Primary open-angle glaucoma, bilateral, moderate stage: Secondary | ICD-10-CM | POA: Diagnosis not present

## 2024-06-03 DIAGNOSIS — C61 Malignant neoplasm of prostate: Secondary | ICD-10-CM | POA: Diagnosis not present

## 2024-06-03 DIAGNOSIS — N184 Chronic kidney disease, stage 4 (severe): Secondary | ICD-10-CM | POA: Diagnosis not present

## 2024-06-04 ENCOUNTER — Other Ambulatory Visit: Payer: Self-pay

## 2024-06-04 MED ORDER — FUROSEMIDE 40 MG PO TABS: 40.0000 mg | ORAL_TABLET | Freq: Two times a day (BID) | ORAL | 1 refills | Status: DC

## 2024-06-16 NOTE — Progress Notes (Unsigned)
 Office Visit Note  Patient: Daniel Love             Date of Birth: 30-Jun-1940           MRN: [id number]             PCP: Chinita Hoy LITTIE DEVONNA Referring: Chinita Hoy LITTIE, [place]* Visit Date: 06/30/2024 Occupation: @GUAROCC @  Subjective:  Medication monitoring   History of Present Illness: Daniel Love is a 84 y.o. male with history of gout.   Patient is currently taking allopurinol 150 mg daily.  He is tolerating allopurinol without any side effects and has not had any recent gaps in therapy.  He has not needed to take colchicine since prior to his last office visit.  He denies any signs or symptoms of a gout flare.  Patient has noticed a 70% improvement in his hand pain and grip strength.  He has been able to improve his fist formation and ability to perform ADLs.  He denies any increased joint pain or joint swelling at this time. He is scheduled for a left atrial appendage occlusion on 08/27/2024.   Activities of Daily Living:  Patient denies any morning stiffness  Patient Denies nocturnal pain.  Difficulty dressing/grooming: Denies Difficulty climbing stairs: Reports Difficulty getting out of chair: Reports Difficulty using hands for taps, buttons, cutlery, and/or writing: Reports  Review of Systems  Constitutional:  Positive for fatigue.  HENT:  Positive for mouth dryness. Negative for mouth sores.   Eyes:  Negative for dryness.  Respiratory:  Negative for shortness of breath.   Cardiovascular:  Negative for chest pain and palpitations.  Gastrointestinal:  Positive for blood in stool. Negative for constipation and diarrhea.  Endocrine: Positive for increased urination.  Genitourinary:  Negative for involuntary urination.  Musculoskeletal:  Positive for joint pain, gait problem, joint pain and joint swelling. Negative for myalgias, muscle weakness, morning stiffness, muscle tenderness and myalgias.  Skin:  Negative for color change, rash, hair loss and sensitivity  to sunlight.  Allergic/Immunologic: Negative for susceptible to infections.  Neurological:  Positive for light-headedness. Negative for dizziness and headaches.  Hematological:  Negative for swollen glands.  Psychiatric/Behavioral:  Positive for depressed mood. Negative for sleep disturbance. The patient is nervous/anxious.     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 04/15/2023   Thoracic aortic aneurysm (HCC) 01/16/2023   Chronic heart failure with preserved ejection fraction (HCC) 01/15/2023   Acquired thrombophilia (HCC) 12/12/2022   Mitral regurgitation and aortic stenosis 07/26/2022   Nonrheumatic tricuspid valve regurgitation 07/26/2022   Malignant neoplasm of prostate (HCC) 04/09/2018   Mitral valve insufficiency 02/26/2018   Benign hypertensive heart disease without heart failure 05/14/2013    Past Medical History:  Diagnosis Date   Anal fissure    hx of    Arthritis    Atrial fibrillation (HCC)    maintaining normal sinus rhythm   Chronic anticoagulation    coumadin    Dyspepsia    Dysrhythmia    Afib   GERD (gastroesophageal reflux disease)    Glaucoma    History of colon polyps 2012   Colonoscopy-Dr. Rollin    History of echocardiogram    Echocardiogram 2/19: EF 60-65, normal wall motion, trivial AI, moderate to severe MR, mild LAE, mild RAE, mild to moderate TR, PASP 35   History of kidney stones  History of nuclear stress test    Myoview  2/19: EF 42, diffuse HK no significant ischemia, apical lateral scar   Hypertension    Prostate cancer (HCC)    Skin cancer    skin cancer removed from left arm and back    Family History  Problem Relation Age of Onset   Cancer Mother        colon   Heart failure Father    Cancer Father        lung   Heart attack Father    Heart disease Brother    Prostate cancer Brother    Colon  cancer Maternal Grandmother    Healthy Daughter    Healthy Daughter    Healthy Daughter    Stroke Neg Hx    Esophageal cancer Neg Hx    Rectal cancer Neg Hx    Stomach cancer Neg Hx    Liver disease Neg Hx    Past Surgical History:  Procedure Laterality Date   BACK SURGERY  12/31/1977   for degenerative disc disease   COLONOSCOPY     COLONOSCOPY WITH ESOPHAGOGASTRODUODENOSCOPY (EGD)     FLEXIBLE SIGMOIDOSCOPY N/A 04/25/2023   Procedure: FLEXIBLE SIGMOIDOSCOPY;  Surgeon: Avram Lupita BRAVO, MD;  Location: [facility];  Service: Gastroenterology;  Laterality: N/A;   HOT HEMOSTASIS N/A 04/25/2023   Procedure: HOT HEMOSTASIS (ARGON PLASMA COAGULATION/BICAP);  Surgeon: Avram Lupita BRAVO, MD;  Location: [facility];  Service: Gastroenterology;  Laterality: N/A;   IR RADIOLOGIST EVAL & MGMT  01/16/2022   IR RADIOLOGIST EVAL & MGMT  06/04/2022   IR RADIOLOGIST EVAL & MGMT  08/05/2023   PROSTATE BIOPSY     RADIOLOGY WITH ANESTHESIA N/A 02/21/2022   Procedure: CT WITH ANESTHESIA CRYOABLATION;  Surgeon: Luverne Aran, MD;  Location: [facility];  Service: Radiology;  Laterality: N/A;   TEE WITHOUT CARDIOVERSION N/A 03/04/2019   Procedure: TRANSESOPHAGEAL ECHOCARDIOGRAM (TEE);  Surgeon: Alveta Aleene PARAS, MD;  Location: [facility] ENDOSCOPY;  Service: Cardiovascular;  Laterality: N/A;   toe removed     Right second toe   TRANSESOPHAGEAL ECHOCARDIOGRAM (CATH LAB) N/A 12/05/2023   Procedure: TRANSESOPHAGEAL ECHOCARDIOGRAM;  Surgeon: Santo Stanly LABOR, MD;  Location: [facility]ASIVE CV LAB;  Service: Cardiovascular;  Laterality: N/A;   UPPER GASTROINTESTINAL ENDOSCOPY     Social History   Social History Narrative   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 They share three daughters, two live local, one in [place]    09-26-18 Unable to ask abuse questions wife and daughter with him today.   Immunization History  Administered Date(s) Administered   Moderna Covid-19 Fall Seasonal  Vaccine 12yrs & older 03/15/2023   PFIZER Comirnaty(Gray Top)Covid-19 Tri-Sucrose Vaccine 04/06/2021   PFIZER(Purple Top)SARS-COV-2 Vaccination 02/04/2020, 02/25/2020, 09/26/2020   Pfizer(Comirnaty)Fall Seasonal Vaccine 12 years and older 10/04/2022     Objective: Vital Signs: BP (!) 97/58 (BP Location: Left Arm, Patient Position: Sitting, Cuff Size: Normal)   Pulse 67   Resp 16   Ht 5' 10 (1.778 m)   Wt 179 lb 9.6 oz (81.5 kg)   BMI 25.77 kg/m    Physical Exam Vitals and nursing note reviewed.  Constitutional:      Appearance: He is well-developed.  HENT:     Head: Normocephalic and atraumatic.   Eyes:     Conjunctiva/sclera: Conjunctivae normal.     Pupils: Pupils are equal, round, and reactive to light.    Cardiovascular:     Rate and Rhythm: Normal  rate and regular rhythm.     Heart sounds: Normal heart sounds.  Pulmonary:     Effort: Pulmonary effort is normal.     Breath sounds: Normal breath sounds.  Abdominal:     General: Bowel sounds are normal.     Palpations: Abdomen is soft.   Musculoskeletal:     Cervical back: Normal range of motion and neck supple.   Skin:    General: Skin is warm and dry.     Capillary Refill: Capillary refill takes less than 2 seconds.   Neurological:     Mental Status: He is alert and oriented to person, place, and time.   Psychiatric:        Behavior: Behavior normal.      Musculoskeletal Exam: Patient remained seated during the examination today.  C-spine has limited range of motion with lateral rotation.  Posture thoracic kyphosis noted.  Shoulder joints have good range of motion with no discomfort.  No tenderness along the elbow joint line.  Small tophi noted on the extensor surface of the right elbow.  CMC, PIP, DIP thickening consistent with osteoarthritis of both hands.  No synovitis noted.  Knee joints have good range of motion no warmth or effusion.  Ankle joints have good range of motion with no tenderness or joint  swelling.  Edema noted in the right lower extremity.  CDAI Exam: CDAI Score: -- Patient Global: --; Provider Global: -- Swollen: --; Tender: -- Joint Exam 06/30/2024   No joint exam has been documented for this visit   There is currently no information documented on the homunculus. Go to the Rheumatology activity and complete the homunculus joint exam.  Investigation: No additional findings.  Imaging: No results found.  Recent Labs: Lab Results  Component Value Date   WBC 5.9 03/17/2024   HGB 11.2 (L) 03/17/2024   PLT 165 03/17/2024   NA 142 04/28/2024   K 4.4 04/28/2024   CL 106 04/28/2024   CO2 26 04/28/2024   GLUCOSE 88 04/28/2024   BUN 55 (H) 04/28/2024   CREATININE 2.11 (H) 04/28/2024   BILITOT 0.4 04/28/2024   AST 20 04/28/2024   ALT 11 04/28/2024   PROT 6.5 04/28/2024   CALCIUM 9.3 04/28/2024   GFRAA 72 02/23/2019    Speciality Comments: No specialty comments available.  Procedures:  No procedures performed Allergies: Amlodipine , Lumigan [bimatoprost], and Lisinopril    Assessment / Plan:     Visit Diagnoses: Idiopathic chronic gout of multiple sites with tophus - Hx of tophaceous gout dx several years ago, CKD, Hyperuricemia, Tophi present bilateral hands, voltaren gel PRN, colchicine PRN: Patient is currently taking allopurinol 150 mg daily.  He is tolerating allopurinol without any side effects and has not had any recent gaps in therapy.  He has not needed to take colchicine since prior to his last office visit.  He has not had any signs or symptoms of a gout flare.  He has noticed about a 70% improvement in the pain and stiffness in his hands.  He has been performing hand exercises at home intermittently.  Offered to place a referral to hand therapy but he declined at this time.  No synovitis was noted on examination today. Plan to update uric acid level today.  Discussed that his uric acid level should be at least below 6 to help minimize risk for gout  flares and ideally less than 4 to reduce the size of the tophi.  May consider increasing the dose of  allopurinol to 200 mg daily pending lab results today. He will notify us  if he develops signs or symptoms of a gout flare.  He will follow-up in the office in 2 months or sooner if needed.- Plan: Uric acid  Medication monitoring encounter - Allopurinol 150 mg 1 tablet by mouth daily.  Uric acid 7.6 on 04/28/2024.  Uric acid level will be checked today. Orders for CBC and CMP released today. - Plan: Uric acid, Comprehensive metabolic panel with GFR, CBC with Differential/Platelet  Long term (current) use of anticoagulants: Switched from warfarin to Eliquis  by Dr. Cindie. Watchman procedure left atrial appendage-likely only requires short-term anticoagulation after the implant--scheduled on 08/27/24.   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   Follow-Up Instructions: Return in about 2 months (around 08/31/2024).   Waddell CHRISTELLA Craze, PA-C  Note - This record has been created using Dragon software.  Chart creation errors have been sought, but may not always  have been located. Such creation errors do not reflect on  the standard of medical care.

## 2024-06-19 DIAGNOSIS — R2241 Localized swelling, mass and lump, right lower limb: Secondary | ICD-10-CM | POA: Diagnosis not present

## 2024-06-19 DIAGNOSIS — I10 Essential (primary) hypertension: Secondary | ICD-10-CM | POA: Diagnosis not present

## 2024-06-19 DIAGNOSIS — L089 Local infection of the skin and subcutaneous tissue, unspecified: Secondary | ICD-10-CM | POA: Diagnosis not present

## 2024-06-30 ENCOUNTER — Encounter: Payer: Self-pay | Admitting: Physician Assistant

## 2024-06-30 ENCOUNTER — Ambulatory Visit: Attending: Physician Assistant | Admitting: Physician Assistant

## 2024-06-30 VITALS — BP 97/58 | HR 67 | Resp 16 | Ht 70.0 in | Wt 179.6 lb

## 2024-06-30 DIAGNOSIS — I080 Rheumatic disorders of both mitral and aortic valves: Secondary | ICD-10-CM | POA: Diagnosis not present

## 2024-06-30 DIAGNOSIS — I5032 Chronic diastolic (congestive) heart failure: Secondary | ICD-10-CM | POA: Insufficient documentation

## 2024-06-30 DIAGNOSIS — C61 Malignant neoplasm of prostate: Secondary | ICD-10-CM | POA: Diagnosis not present

## 2024-06-30 DIAGNOSIS — I361 Nonrheumatic tricuspid (valve) insufficiency: Secondary | ICD-10-CM | POA: Insufficient documentation

## 2024-06-30 DIAGNOSIS — D6869 Other thrombophilia: Secondary | ICD-10-CM | POA: Diagnosis present

## 2024-06-30 DIAGNOSIS — I340 Nonrheumatic mitral (valve) insufficiency: Secondary | ICD-10-CM | POA: Diagnosis present

## 2024-06-30 DIAGNOSIS — I351 Nonrheumatic aortic (valve) insufficiency: Secondary | ICD-10-CM | POA: Diagnosis not present

## 2024-06-30 DIAGNOSIS — Z7901 Long term (current) use of anticoagulants: Secondary | ICD-10-CM | POA: Diagnosis present

## 2024-06-30 DIAGNOSIS — I7121 Aneurysm of the ascending aorta, without rupture: Secondary | ICD-10-CM | POA: Insufficient documentation

## 2024-06-30 DIAGNOSIS — K627 Radiation proctitis: Secondary | ICD-10-CM | POA: Diagnosis present

## 2024-06-30 DIAGNOSIS — I4821 Permanent atrial fibrillation: Secondary | ICD-10-CM | POA: Diagnosis not present

## 2024-06-30 DIAGNOSIS — Z5181 Encounter for therapeutic drug level monitoring: Secondary | ICD-10-CM | POA: Diagnosis present

## 2024-06-30 DIAGNOSIS — I119 Hypertensive heart disease without heart failure: Secondary | ICD-10-CM | POA: Diagnosis not present

## 2024-06-30 DIAGNOSIS — M1A09X1 Idiopathic chronic gout, multiple sites, with tophus (tophi): Secondary | ICD-10-CM | POA: Diagnosis not present

## 2024-07-01 ENCOUNTER — Ambulatory Visit: Payer: Self-pay | Admitting: Physician Assistant

## 2024-07-01 LAB — URIC ACID: Uric Acid, Serum: 6.5 mg/dL (ref 4.0–8.0)

## 2024-07-01 LAB — CBC WITH DIFFERENTIAL/PLATELET
Absolute Lymphocytes: 883 {cells}/uL (ref 850–3900)
Absolute Monocytes: 788 {cells}/uL (ref 200–950)
Basophils Absolute: 29 {cells}/uL (ref 0–200)
Basophils Relative: 0.4 %
Eosinophils Absolute: 197 {cells}/uL (ref 15–500)
Eosinophils Relative: 2.7 %
HCT: 27.6 % — ABNORMAL LOW (ref 38.5–50.0)
Hemoglobin: 8.7 g/dL — ABNORMAL LOW (ref 13.2–17.1)
MCH: 29.9 pg (ref 27.0–33.0)
MCHC: 31.5 g/dL — ABNORMAL LOW (ref 32.0–36.0)
MCV: 94.8 fL (ref 80.0–100.0)
MPV: 9.6 fL (ref 7.5–12.5)
Monocytes Relative: 10.8 %
Neutro Abs: 5402 {cells}/uL (ref 1500–7800)
Neutrophils Relative %: 74 %
Platelets: 241 Thousand/uL (ref 140–400)
RBC: 2.91 10*6/uL — ABNORMAL LOW (ref 4.20–5.80)
RDW: 14.3 % (ref 11.0–15.0)
Total Lymphocyte: 12.1 %
WBC: 7.3 Thousand/uL (ref 3.8–10.8)

## 2024-07-01 LAB — COMPREHENSIVE METABOLIC PANEL WITH GFR
AG Ratio: 1.6 (calc) (ref 1.0–2.5)
ALT: 10 U/L (ref 9–46)
AST: 18 U/L (ref 10–35)
Albumin: 3.9 g/dL (ref 3.6–5.1)
Alkaline phosphatase (APISO): 69 U/L (ref 35–144)
BUN/Creatinine Ratio: 30 (calc) — ABNORMAL HIGH (ref 6–22)
BUN: 65 mg/dL — ABNORMAL HIGH (ref 7–25)
CO2: 27 mmol/L (ref 20–32)
Calcium: 9.3 mg/dL (ref 8.6–10.3)
Chloride: 105 mmol/L (ref 98–110)
Creat: 2.16 mg/dL — ABNORMAL HIGH (ref 0.70–1.22)
Globulin: 2.5 g/dL (ref 1.9–3.7)
Glucose, Bld: 96 mg/dL (ref 65–99)
Potassium: 4.9 mmol/L (ref 3.5–5.3)
Sodium: 140 mmol/L (ref 135–146)
Total Bilirubin: 0.3 mg/dL (ref 0.2–1.2)
Total Protein: 6.4 g/dL (ref 6.1–8.1)
eGFR: 30 mL/min/{1.73_m2} — ABNORMAL LOW (ref >=60)

## 2024-07-01 NOTE — Progress Notes (Signed)
 RBC count, hgb, and hct remain low but have dropped significantly. Hemoglobin is 8.7.  please notify the patient and advise the patient to scheduled appt with either PCP or hematologist (if he has one) urgently.   Creatinine remains elevated-2.16 and GFR low-30.   Uric acid has improved-6.5--continues to trend down.   Reviewed labs with Dr. Dolphus. Recommend continuing current dose of allopurinol for now since he has been flare free

## 2024-07-07 DIAGNOSIS — N1832 Chronic kidney disease, stage 3b: Secondary | ICD-10-CM | POA: Diagnosis not present

## 2024-07-07 DIAGNOSIS — D500 Iron deficiency anemia secondary to blood loss (chronic): Secondary | ICD-10-CM | POA: Diagnosis not present

## 2024-07-07 DIAGNOSIS — D631 Anemia in chronic kidney disease: Secondary | ICD-10-CM | POA: Diagnosis not present

## 2024-07-07 DIAGNOSIS — N184 Chronic kidney disease, stage 4 (severe): Secondary | ICD-10-CM | POA: Diagnosis not present

## 2024-07-09 DIAGNOSIS — D631 Anemia in chronic kidney disease: Secondary | ICD-10-CM | POA: Diagnosis not present

## 2024-07-09 DIAGNOSIS — N184 Chronic kidney disease, stage 4 (severe): Secondary | ICD-10-CM | POA: Diagnosis not present

## 2024-07-09 DIAGNOSIS — I4821 Permanent atrial fibrillation: Secondary | ICD-10-CM | POA: Diagnosis not present

## 2024-07-09 DIAGNOSIS — D500 Iron deficiency anemia secondary to blood loss (chronic): Secondary | ICD-10-CM | POA: Diagnosis not present

## 2024-07-13 DIAGNOSIS — I10 Essential (primary) hypertension: Secondary | ICD-10-CM | POA: Diagnosis not present

## 2024-07-13 DIAGNOSIS — D500 Iron deficiency anemia secondary to blood loss (chronic): Secondary | ICD-10-CM | POA: Diagnosis not present

## 2024-07-13 DIAGNOSIS — K625 Hemorrhage of anus and rectum: Secondary | ICD-10-CM | POA: Diagnosis not present

## 2024-07-13 DIAGNOSIS — I4821 Permanent atrial fibrillation: Secondary | ICD-10-CM | POA: Diagnosis not present

## 2024-07-13 DIAGNOSIS — D631 Anemia in chronic kidney disease: Secondary | ICD-10-CM | POA: Diagnosis not present

## 2024-07-13 DIAGNOSIS — Z7901 Long term (current) use of anticoagulants: Secondary | ICD-10-CM | POA: Diagnosis not present

## 2024-07-13 DIAGNOSIS — N184 Chronic kidney disease, stage 4 (severe): Secondary | ICD-10-CM | POA: Diagnosis not present

## 2024-07-13 DIAGNOSIS — I080 Rheumatic disorders of both mitral and aortic valves: Secondary | ICD-10-CM | POA: Diagnosis not present

## 2024-07-15 DIAGNOSIS — D631 Anemia in chronic kidney disease: Secondary | ICD-10-CM | POA: Diagnosis not present

## 2024-07-15 DIAGNOSIS — N184 Chronic kidney disease, stage 4 (severe): Secondary | ICD-10-CM | POA: Diagnosis not present

## 2024-07-15 DIAGNOSIS — I5030 Unspecified diastolic (congestive) heart failure: Secondary | ICD-10-CM | POA: Diagnosis not present

## 2024-07-15 DIAGNOSIS — R609 Edema, unspecified: Secondary | ICD-10-CM | POA: Diagnosis not present

## 2024-07-15 DIAGNOSIS — I50810 Right heart failure, unspecified: Secondary | ICD-10-CM | POA: Diagnosis not present

## 2024-07-15 DIAGNOSIS — I4891 Unspecified atrial fibrillation: Secondary | ICD-10-CM | POA: Diagnosis not present

## 2024-07-28 DIAGNOSIS — N184 Chronic kidney disease, stage 4 (severe): Secondary | ICD-10-CM | POA: Diagnosis not present

## 2024-07-28 DIAGNOSIS — D631 Anemia in chronic kidney disease: Secondary | ICD-10-CM | POA: Diagnosis not present

## 2024-07-29 DIAGNOSIS — L570 Actinic keratosis: Secondary | ICD-10-CM | POA: Diagnosis not present

## 2024-07-29 DIAGNOSIS — L821 Other seborrheic keratosis: Secondary | ICD-10-CM | POA: Diagnosis not present

## 2024-07-29 DIAGNOSIS — L853 Xerosis cutis: Secondary | ICD-10-CM | POA: Diagnosis not present

## 2024-07-29 DIAGNOSIS — D692 Other nonthrombocytopenic purpura: Secondary | ICD-10-CM | POA: Diagnosis not present

## 2024-07-29 DIAGNOSIS — D1801 Hemangioma of skin and subcutaneous tissue: Secondary | ICD-10-CM | POA: Diagnosis not present

## 2024-07-29 DIAGNOSIS — Z85828 Personal history of other malignant neoplasm of skin: Secondary | ICD-10-CM | POA: Diagnosis not present

## 2024-07-29 DIAGNOSIS — L812 Freckles: Secondary | ICD-10-CM | POA: Diagnosis not present

## 2024-07-30 DIAGNOSIS — N184 Chronic kidney disease, stage 4 (severe): Secondary | ICD-10-CM | POA: Diagnosis not present

## 2024-07-30 DIAGNOSIS — C61 Malignant neoplasm of prostate: Secondary | ICD-10-CM | POA: Diagnosis not present

## 2024-08-03 ENCOUNTER — Other Ambulatory Visit: Payer: Self-pay | Admitting: Internal Medicine

## 2024-08-10 DIAGNOSIS — N184 Chronic kidney disease, stage 4 (severe): Secondary | ICD-10-CM | POA: Diagnosis not present

## 2024-08-10 DIAGNOSIS — N1832 Chronic kidney disease, stage 3b: Secondary | ICD-10-CM | POA: Diagnosis not present

## 2024-08-16 NOTE — Progress Notes (Unsigned)
  Electrophysiology Office Note:   Date:  08/17/2024  ID:  Daniel Love, DOB Aug 07, 1940, MRN [id number]  Primary Cardiologist: Stanly DELENA Leavens, MD Electrophysiologist: OLE ONEIDA HOLTS, MD   Electrophysiologist:  OLE ONEIDA HOLTS, MD      History of Present Illness:   Daniel Love is a 84 y.o. male with h/o permanent AF, CKD IV, and HFpEF seen today for routine electrophysiology followup.   Seen by Dr. HOLTS 02/05/2024 for watchman discussion. Transitioned from coumadin  to Eliquis  with plans to consider LAAO if he continued to have issues, as he was felt to be a good candidate.   Scheduled for 08/27/2024 with TEE given CKD.   Since last being seen in our clinic the patient reports doing well. Daughter presents and questions regarding procedure answered. Overall, he denies chest pain, palpitations, dyspnea, PND, orthopnea, nausea, vomiting, dizziness, syncope, edema, weight gain, or early satiety.   Review of systems complete and found to be negative unless listed in HPI.   EP Information / Studies Reviewed:    EKG is ordered today. Personal review as below.       Arrhythmia/Device History No specialty comments available.   Physical Exam:   VS:  BP 108/65   Pulse 67   Ht 5' 10 (1.778 m)   Wt 171 lb 6.4 oz (77.7 kg)   SpO2 97%   BMI 24.59 kg/m    Wt Readings from Last 3 Encounters:  08/17/24 171 lb 6.4 oz (77.7 kg)  06/30/24 179 lb 9.6 oz (81.5 kg)  05/05/24 182 lb 9.6 oz (82.8 kg)     GEN: No acute distress NECK: No JVD; No carotid bruits CARDIAC: Irregularly irregular rate and rhythm, no murmurs, rubs, gallops RESPIRATORY:  Clear to auscultation without rales, wheezing or rhonchi  ABDOMEN: Soft, non-tender, non-distended EXTREMITIES:  No edema; No deformity   ASSESSMENT AND PLAN:    Permanent AF EKG today shows AF with occasional ectopy Tolerating Eliquis , but still having intermittent GI bleeding, as well as anemia with symptoms of fatigue.   Plan for Watchman 08/27/2024 with TEE due to CKD.  Re-reviewed risks and benefits and letter given today.  Labs and letters given today.    CKD IV Will need TEE given CKD   HFpEF Volume status stable on exam.   HTN Stable on current regimen    Symptomatic anemia GI bleeding Would like to be able to come off OAC as above.      Follow up with EP Team as usual post procedure  Signed, Ozell Prentice Passey, PA-C

## 2024-08-17 ENCOUNTER — Ambulatory Visit: Attending: Student | Admitting: Student

## 2024-08-17 ENCOUNTER — Encounter: Payer: Self-pay | Admitting: Student

## 2024-08-17 VITALS — BP 108/65 | HR 67 | Ht 70.0 in | Wt 171.4 lb

## 2024-08-17 DIAGNOSIS — I4821 Permanent atrial fibrillation: Secondary | ICD-10-CM | POA: Insufficient documentation

## 2024-08-17 DIAGNOSIS — I5032 Chronic diastolic (congestive) heart failure: Secondary | ICD-10-CM | POA: Insufficient documentation

## 2024-08-17 DIAGNOSIS — Z7901 Long term (current) use of anticoagulants: Secondary | ICD-10-CM | POA: Insufficient documentation

## 2024-08-17 DIAGNOSIS — I340 Nonrheumatic mitral (valve) insufficiency: Secondary | ICD-10-CM | POA: Insufficient documentation

## 2024-08-17 NOTE — Patient Instructions (Addendum)
 Medication Instructions:  Your physician recommends that you continue on your current medications as directed. Please refer to the Current Medication list given to you today.  *If you need a refill on your cardiac medications before your next appointment, please call your pharmacy*  Lab Work: BMET, CBC-TODAY If you have labs (blood work) drawn today and your tests are completely normal, you will receive your results only by: MyChart Message (if you have MyChart) OR A paper copy in the mail If you have any lab test that is abnormal or we need to change your treatment, we will call you to review the results.  Testing/Procedures: See letter  Follow-Up: At [facility], you and your health needs are our priority.  As part of our continuing mission to provide you with exceptional heart care, our providers are all part of one team.  This team includes your primary Cardiologist (physician) and Advanced Practice Providers or APPs (Physician Assistants and Nurse Practitioners) who all work together to provide you with the care you need, when you need it.  Your next appointment:   As scheduled

## 2024-08-18 ENCOUNTER — Ambulatory Visit: Payer: Self-pay | Admitting: Student

## 2024-08-18 DIAGNOSIS — N184 Chronic kidney disease, stage 4 (severe): Secondary | ICD-10-CM | POA: Diagnosis not present

## 2024-08-18 DIAGNOSIS — D500 Iron deficiency anemia secondary to blood loss (chronic): Secondary | ICD-10-CM | POA: Diagnosis not present

## 2024-08-18 DIAGNOSIS — D631 Anemia in chronic kidney disease: Secondary | ICD-10-CM | POA: Diagnosis not present

## 2024-08-18 LAB — BASIC METABOLIC PANEL WITH GFR
BUN/Creatinine Ratio: 23 (ref 10–24)
BUN: 58 mg/dL — AB (ref 8–27)
CO2: 22 mmol/L (ref 20–29)
Calcium: 9.3 mg/dL (ref 8.6–10.2)
Chloride: 104 mmol/L (ref 96–106)
Creatinine, Ser: 2.48 mg/dL — AB (ref 0.76–1.27)
Glucose: 79 mg/dL (ref 70–99)
Potassium: 4.3 mmol/L (ref 3.5–5.2)
Sodium: 141 mmol/L (ref 134–144)
eGFR: 25 mL/min/1.73 — AB (ref >59)

## 2024-08-18 LAB — CBC
Hematocrit: 32.9 % — ABNORMAL LOW (ref 37.5–51.0)
Hemoglobin: 10.2 g/dL — ABNORMAL LOW (ref 13.0–17.7)
MCH: 29.7 pg (ref 26.6–33.0)
MCHC: 31 g/dL — ABNORMAL LOW (ref 31.5–35.7)
MCV: 96 fL (ref 79–97)
Platelets: 204 x10E3/uL (ref 150–450)
RBC: 3.43 x10E6/uL — ABNORMAL LOW (ref 4.14–5.80)
RDW: 13.9 % (ref 11.6–15.4)
WBC: 7 x10E3/uL (ref 3.4–10.8)

## 2024-08-18 NOTE — Nursing Note (Signed)
 Erythropoiesis Stimulating Agent (ESA) SUBSEQUENT administration  Support for ESA therapy initiation is available elsewhere in this electronic health record under a [facility] physician encounter note. This documentation to attach to subsequent ESA administration(s).   Daniel Love has a diagnosis of Chronic Kidney Disease (CKD), is not on dialysis, and has symptomatic anemia of CKD.  Anemia diagnosis threshold of Hemoglobin less than 12 g/dL and/or Hematocrit less than 36%: Yes Lab Results  Component Value Date/Time   HGB 9.9 (L) 08/18/2024 01:53 PM   HCT 32.9 (L) 08/18/2024 01:53 PM    CKD stage based on glomerular filtration rate (GFR) Lab Results  Component Value Date/Time   eGFR 24 (L) 08/10/2024 11:12 AM   CKD Stage 4 (GFR 15-29)   Creatinine in past month (ensure date reflected within time range) Lab Results  Component Value Date/Time   Creatinine 2.54 (H) 08/10/2024 11:12 AM     Patient here to receive aranesp 300mcg [place]. Injection given in left arm SQ with no complications. Patient tolerated well and band-aid applied.  Patient elected not to remain in the office the advised post injection wait time, was instructed to seek nearest ED if any adverse effects arise.  Understanding verbalized, patient discharged from clinic alert, oriented, and self-ambulatory.   Devere Cheese, RN BSN 08/18/2024 / 2:28 PM

## 2024-08-20 NOTE — Progress Notes (Unsigned)
 Office Visit Note  Patient: Daniel Love             Date of Birth: [birth year]           MRN: [id number]             PCP: Daniel Love Referring: Daniel Hoy LITTIE, [place]* Visit Date: 09/03/2024 Occupation: @GUAROCC @  Subjective:  Medication monitoring   History of Present Illness: Daniel Love is a 84 y.o. male with history of gout.  Patient is currently taking allopurinol 150 mg by mouth daily.  He is to the rating allopurinol without any side effects and has not had any recent gaps in therapy.  He takes colchicine sparingly if needed for a flare.  Patient states that he has been able to make a complete fist with both hands, right hand tighter than the left.  He denies any joint swelling at this time.  His energy level has been stable.   Activities of Daily Living:  Patient reports morning stiffness for 0 minute.   Patient Denies nocturnal pain.  Difficulty dressing/grooming: Denies Difficulty climbing stairs: Reports Difficulty getting out of chair: Reports Difficulty using hands for taps, buttons, cutlery, and/or writing: Denies  Review of Systems  Constitutional:  Negative for fatigue.  HENT:  Negative for mouth sores and mouth dryness.   Eyes:  Negative for dryness.  Respiratory:  Negative for shortness of breath.   Cardiovascular:  Negative for chest pain and palpitations.  Gastrointestinal:  Positive for blood in stool and constipation. Negative for diarrhea.  Endocrine: Positive for increased urination.  Genitourinary:  Negative for involuntary urination.  Musculoskeletal:  Positive for joint pain, gait problem, joint pain, myalgias and myalgias. Negative for joint swelling, muscle weakness, morning stiffness and muscle tenderness.  Skin:  Negative for color change, rash, hair loss and sensitivity to sunlight.  Allergic/Immunologic: Negative for susceptible to infections.  Neurological:  Negative for dizziness and headaches.  Hematological:   Negative for swollen glands.  Psychiatric/Behavioral:  Positive for sleep disturbance. Negative for depressed mood. The patient is not nervous/anxious.     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 04/15/2023   Thoracic aortic aneurysm (HCC) 01/16/2023   Chronic heart failure with preserved ejection fraction (HCC) 01/15/2023   Acquired thrombophilia (HCC) 12/12/2022   Mitral regurgitation and aortic stenosis 07/26/2022   Nonrheumatic tricuspid valve regurgitation 07/26/2022   Malignant neoplasm of prostate (HCC) 04/09/2018   Mitral valve insufficiency 02/26/2018   Benign hypertensive heart disease without heart failure 05/14/2013    Past Medical History:  Diagnosis Date   Anal fissure    hx of    Arthritis    Atrial fibrillation (HCC)    maintaining normal sinus rhythm   Chronic anticoagulation    coumadin    Dyspepsia    Dysrhythmia    Afib   GERD (gastroesophageal reflux disease)    Glaucoma    History of colon polyps 2012   Colonoscopy-Dr. Rollin    History of echocardiogram    Echocardiogram 2/19: EF 60-65, normal wall motion, trivial AI, moderate to severe MR, mild LAE, mild RAE, mild to moderate TR, PASP 35   History of kidney stones    History of nuclear stress test    Myoview  2/19: EF 42, diffuse HK no significant ischemia, apical lateral scar  Hypertension    Prostate cancer (HCC)    Skin cancer    skin cancer removed from left arm and back    Family History  Problem Relation Age of Onset   Cancer Mother        colon   Heart failure Father    Cancer Father        lung   Heart attack Father    Heart disease Brother    Prostate cancer Brother    Colon cancer Maternal Grandmother    Healthy Daughter    Healthy Daughter    Healthy Daughter    Stroke Neg Hx    Esophageal cancer Neg Hx     Rectal cancer Neg Hx    Stomach cancer Neg Hx    Liver disease Neg Hx    Past Surgical History:  Procedure Laterality Date   BACK SURGERY  12/31/1977   for degenerative disc disease   COLONOSCOPY     COLONOSCOPY WITH ESOPHAGOGASTRODUODENOSCOPY (EGD)     FLEXIBLE SIGMOIDOSCOPY N/A 04/25/2023   Procedure: FLEXIBLE SIGMOIDOSCOPY;  Surgeon: Avram Lupita BRAVO, MD;  Location: [facility];  Service: Gastroenterology;  Laterality: N/A;   HOT HEMOSTASIS N/A 04/25/2023   Procedure: HOT HEMOSTASIS (ARGON PLASMA COAGULATION/BICAP);  Surgeon: Avram Lupita BRAVO, MD;  Location: [facility];  Service: Gastroenterology;  Laterality: N/A;   IR RADIOLOGIST EVAL & MGMT  01/16/2022   IR RADIOLOGIST EVAL & MGMT  06/04/2022   IR RADIOLOGIST EVAL & MGMT  08/05/2023   LEFT ATRIAL APPENDAGE OCCLUSION N/A 08/27/2024   Procedure: LEFT ATRIAL APPENDAGE OCCLUSION;  Surgeon: Cindie Ole DASEN, MD;  Location: [facility];  Service: Cardiovascular;  Laterality: N/A;   PROSTATE BIOPSY     RADIOLOGY WITH ANESTHESIA N/A 02/21/2022   Procedure: CT WITH ANESTHESIA CRYOABLATION;  Surgeon: Luverne Aran, MD;  Location: [facility];  Service: Radiology;  Laterality: N/A;   TEE WITHOUT CARDIOVERSION N/A 03/04/2019   Procedure: TRANSESOPHAGEAL ECHOCARDIOGRAM (TEE);  Surgeon: Alveta Aleene PARAS, MD;  Location: [facility];  Service: Cardiovascular;  Laterality: N/A;   toe removed     Right second toe   TRANSESOPHAGEAL ECHOCARDIOGRAM (CATH LAB) N/A 12/05/2023   Procedure: TRANSESOPHAGEAL ECHOCARDIOGRAM;  Surgeon: Santo Stanly LABOR, MD;  Location: [facility];  Service: Cardiovascular;  Laterality: N/A;   TRANSESOPHAGEAL ECHOCARDIOGRAM (CATH LAB) N/A 08/27/2024   Procedure: TRANSESOPHAGEAL ECHOCARDIOGRAM;  Surgeon: Cindie Ole DASEN, MD;  Location: [facility] INVAS[facility] CV [facility];  Service: Cardiovascular;  Laterality: N/A;   UPPER GASTROINTESTINAL ENDOSCOPY     Social History   Social History Narrative   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 They share three daughters, two live local, one in [place]    09-26-18 Unable to ask abuse questions wife and daughter with him today.   Immunization History  Administered Date(s) Administered   Moderna Covid-19 Fall Seasonal Vaccine 33yrs & older 03/15/2023   PFIZER Comirnaty(Gray Top)Covid-19 Tri-Sucrose Vaccine 04/06/2021   PFIZER(Purple Top)SARS-COV-2 Vaccination 02/04/2020, 02/25/2020, 09/26/2020   Pfizer(Comirnaty)Fall Seasonal Vaccine 12 years and older 10/04/2022     Objective: Vital Signs: BP 92/60 (BP Location: Left Arm, Patient Position: Sitting, Cuff Size: Normal)   Pulse 65   Resp 14   Ht 5' 10 (1.778 m)   Wt 176 lb 12.8 oz (80.2 kg)   BMI 25.37 kg/m    Physical Exam Vitals and nursing note reviewed.  Constitutional:      Appearance: He is well-developed.  HENT:  Head: Normocephalic and atraumatic.  Eyes:     Conjunctiva/sclera: Conjunctivae normal.     Pupils: Pupils are equal, round, and reactive to light.  Cardiovascular:     Rate and Rhythm: Normal rate and regular rhythm.     Heart sounds: Normal heart sounds.  Pulmonary:     Effort: Pulmonary effort is normal.     Breath sounds: Normal breath sounds.  Abdominal:     General: Bowel sounds are normal.     Palpations: Abdomen is soft.  Musculoskeletal:     Cervical back: Normal range of motion and neck supple.  Skin:    General: Skin is warm and dry.     Capillary Refill: Capillary refill takes less than 2 seconds.  Neurological:     Mental Status: He is alert and oriented to person, place, and time.  Psychiatric:        Behavior: Behavior normal.      Musculoskeletal Exam: Patient remained seated during examination today.  C-spine has limited range of motion without rotation.  Thoracic kyphosis noted.  Shoulder joints have good range of motion.  CMC joint, PIP, and DIP thickening consistent with osteoarthritis of both hands.  Knee joints have good range  of motion no warmth or effusion.  Ankle joints have good range of motion with no tenderness or joint swelling. Pedal edema noted in bilateral LE.   CDAI Exam: CDAI Score: -- Patient Global: --; Provider Global: -- Swollen: --; Tender: -- Joint Exam 09/03/2024   No joint exam has been documented for this visit   There is currently no information documented on the homunculus. Go to the Rheumatology activity and complete the homunculus joint exam.  Investigation: No additional findings.  Imaging: EP STUDY Result Date: 08/27/2024 CONCLUSIONS: 1.Successful implantation of a WATCHMAN left atrial appendage occlusive device   2. TEE demonstrating no LAA thrombus 3. ICE demonstrating no LAA thrombus. 4. No early apparent complications. Post Implant Anticoagulation Strategy: Continue Eliquis  2.5mg  by mouth twice daily for 6 months after implant. After 6 months, stop Eliquis  and start Aspirin 81mg  by mouth once daily. Plan for TEE after 60 days to assess appendage patency and Watchman position.   ECHO TEE Result Date: 08/27/2024    TRANSESOPHOGEAL ECHO REPORT   Patient Name:   Daniel Love Date of Exam: 08/27/2024 Medical Rec #:  [id number]        Height:       70.0 in Accession #:    [id number]       Weight:       171.4 lb Date of Birth:  05/12/1940       BSA:          1.955 m Patient Age:    83 years         BP:           126/86 mmHg Patient Gender: M                HR:           102 bpm. Exam Location:  Inpatient Procedure: Transesophageal Echo, 3D Echo, Cardiac Doppler and Color Doppler            (Both Spectral and Color Flow Doppler were utilized during            procedure). Indications:    Watchman  History:        Patient has prior history of Echocardiogram examinations, most  recent 12/05/2023. Arrythmias:Atrial Fibrillation; Risk                 Factors:Hypertension.  Sonographer:    Jayson Gaskins Referring Phys: [id number] OLE ONEIDA HOLTS PROCEDURE: After discussion of the risks  and benefits of a TEE, an informed consent was obtained from the patient. The transesophogeal probe was passed without difficulty through the esophogus of the patient. Sedation performed by different physician. The patient developed no complications during the procedure.  IMPRESSIONS  1. Chicken Wing appendage with no thrombus. Well placed 24 mm Watchman FLX device. No leak by color flow. Negative tug test Average compression 24%.  2. Left ventricular ejection fraction, by estimation, is 55%. The left ventricle has normal function.  3. Right ventricular systolic function is mildly reduced. The right ventricular size is mildly enlarged.  4. Left atrial size was severely dilated. No left atrial/left atrial appendage thrombus was detected.  5. Right atrial size was severely dilated.  6. Moderate MR with ERO 0.25 cm2 and RV 41 cc. P3 prolapse ? cleft between P2/P3 MVA 8.8 cm2 with peak diastoic gradient 2 mmHg . The mitral valve is abnormal. Moderate mitral valve regurgitation.  7. Tricuspid valve regurgitation is moderate.  8. The aortic valve is tricuspid. There is mild calcification of the aortic valve. There is mild thickening of the aortic valve. Aortic valve regurgitation is mild to moderate. Aortic valve sclerosis/calcification is present, without any evidence of aortic stenosis.  9. 3D performed of the mitral valve and 3D performed of the tricuspid valve and demonstrates 3D of MV and TV performed. 10. Post trans septal puncture only left to right shunting. FINDINGS  Left Ventricle: Left ventricular ejection fraction, by estimation, is 55%. The left ventricle has normal function. The left ventricular internal cavity size was normal in size. Right Ventricle: The right ventricular size is mildly enlarged. Right vetricular wall thickness was not assessed. Right ventricular systolic function is mildly reduced. Left Atrium: Left atrial size was severely dilated. No left atrial/left atrial appendage thrombus was  detected. Right Atrium: Right atrial size was severely dilated. Pericardium: There is no evidence of pericardial effusion. Mitral Valve: Moderate MR with ERO 0.25 cm2 and RV 41 cc. P3 prolapse ? cleft between P2/P3 MVA 8.8 cm2 with peak diastoic gradient 2 mmHg. The mitral valve is abnormal. Moderate mitral valve regurgitation. MV peak gradient, 1.5 mmHg. The mean mitral valve gradient is 1.0 mmHg. Tricuspid Valve: The tricuspid valve is normal in structure. Tricuspid valve regurgitation is moderate. Aortic Valve: The aortic valve is tricuspid. There is mild calcification of the aortic valve. There is mild thickening of the aortic valve. Aortic valve regurgitation is mild to moderate. Aortic valve sclerosis/calcification is present, without any evidence of aortic stenosis. Aortic valve mean gradient measures 2.0 mmHg. Aortic valve peak gradient measures 3.4 mmHg. Pulmonic Valve: The pulmonic valve was normal in structure. Pulmonic valve regurgitation is mild. Aorta: The aortic root is normal in size and structure. IAS/Shunts: Post trans septal puncture only left to right shunting. Additional Comments: Chicken Wing appendage with no thrombus. Well placed 24 mm Watchman FLX device. No leak by color flow. Negative tug test Average compression 24%. 3D was performed not requiring image post processing on an independent workstation and was normal. AORTIC VALVE AV Vmax:      92.50 cm/s AV Vmean:     73.200 cm/s AV VTI:       0.197 m AV Peak Grad: 3.4 mmHg AV Mean Grad: 2.0 mmHg MITRAL  VALVE MV Peak grad: 1.5 mmHg MV Mean grad: 1.0 mmHg MV Vmax:      0.62 m/s MV Vmean:     45.4 cm/s MR Peak grad:    89.5 mmHg MR Mean grad:    60.0 mmHg MR Vmax:         473.00 cm/s MR Vmean:        372.0 cm/s MR PISA:         3.08 cm MR PISA Eff ROA: 25 mm MR PISA Radius:  0.70 cm Maude Emmer MD Electronically signed by Maude Emmer MD Signature Date/Time: 08/27/2024/9:35:06 AM    Final     Recent Labs: Lab Results  Component Value  Date   WBC 7.0 08/17/2024   HGB 11.9 (L) 08/27/2024   PLT 204 08/17/2024   NA 142 08/27/2024   K 3.9 08/27/2024   CL 108 08/27/2024   CO2 22 08/17/2024   GLUCOSE 102 (H) 08/27/2024   BUN 61 (H) 08/27/2024   CREATININE 2.50 (H) 08/27/2024   BILITOT 0.3 06/30/2024   AST 18 06/30/2024   ALT 10 06/30/2024   PROT 6.4 06/30/2024   CALCIUM 9.3 08/17/2024   GFRAA 72 02/23/2019    Speciality Comments: No specialty comments available.  Procedures:  No procedures performed Allergies: Amlodipine , Lumigan [bimatoprost], and Lisinopril   Uric acid 6.5 on 06/30/24  Assessment / Plan:     Visit Diagnoses: Idiopathic chronic gout of multiple sites with tophus - Hx of tophaceous gout dx several years ago, CKD, Hyperuricemia, Tophi present bilateral hands, voltaren gel PRN, colchicine PRN: He has not had any signs or symptoms of a gout flare.  He has clinically been doing well taking allopurinol 150 mg daily.  He is tolerating allopurinol without any side effects and has not had any recent gaps in therapy.  He has noticed a significant improvement in the pain, stiffness, and inflammation in his hands.  He is able to make a complete fist bilaterally, right fist tighter than the left.  There was no active synovitis on examination today. Uric acid has improved to 6.5 on 06/30/2024.  Plan to recheck uric acid level today.  He will remain on the current treatment regimen.  He was advised to notify us  if he develops any signs or symptoms of a gout flare.  He will follow-up in the office in 6 months or sooner if needed.- Plan: Uric acid  Medication monitoring encounter - Allopurinol 150 mg 1 tablet by mouth daily.  Creatinine 2.50 on 08/27/24.   Creatinine was 2.48 and GFR was 25 on 08/17/2024. Plan to check uric acid today.  - Plan: Uric acid  Long term (current) use of anticoagulants - Switched from warfarin to Eliquis  by Dr. Cindie. Watchman procedure-Performed on 08/27/24.  According to the patient he will  require anticoagulation for the next 6 months.  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   Follow-Up Instructions: Return in about 6  months (around 03/03/2025) for Gout.   Waddell CHRISTELLA Craze, PA-C  Note - This record has been created using Dragon software.  Chart creation errors have been sought, but may not always  have been located. Such creation errors do not reflect on  the standard of medical care.

## 2024-08-25 ENCOUNTER — Telehealth: Payer: Self-pay

## 2024-08-25 NOTE — Telephone Encounter (Signed)
 Confirmed procedure date of 08/27/2024. Confirmed NEW arrival time of 0645 for procedure time at 0915. Reviewed pre-procedure instructions with patient. Confirmed he stopped his Farxiga  as directed. Confirmed he has no contrast allergy. Confirmed no PPM/defibrillator. The patient understands to call if questions/concerns arise prior to procedure. He was grateful for call and agreed with plan.

## 2024-08-26 NOTE — Progress Notes (Signed)
 Spoke with the pt, he will arrive tom at 0530. NPO post midnight.

## 2024-08-27 ENCOUNTER — Inpatient Hospital Stay (HOSPITAL_COMMUNITY)
Admission: RE | Disposition: A | Discharge status: Home / Self Care | Payer: Self-pay | Source: Home / Self Care | Attending: Cardiology

## 2024-08-27 ENCOUNTER — Inpatient Hospital Stay (HOSPITAL_COMMUNITY)

## 2024-08-27 ENCOUNTER — Other Ambulatory Visit: Payer: Self-pay

## 2024-08-27 ENCOUNTER — Encounter (HOSPITAL_COMMUNITY): Payer: Self-pay | Admitting: Cardiology

## 2024-08-27 ENCOUNTER — Inpatient Hospital Stay (HOSPITAL_COMMUNITY)
Admission: RE | Admit: 2024-08-27 | Discharge: 2024-08-27 | DRG: 274 | Disposition: A | Discharge status: Home / Self Care | Attending: Cardiology | Admitting: Cardiology

## 2024-08-27 DIAGNOSIS — Z87891 Personal history of nicotine dependence: Secondary | ICD-10-CM

## 2024-08-27 DIAGNOSIS — Z7902 Long term (current) use of antithrombotics/antiplatelets: Secondary | ICD-10-CM | POA: Diagnosis not present

## 2024-08-27 DIAGNOSIS — I4891 Unspecified atrial fibrillation: Secondary | ICD-10-CM | POA: Diagnosis not present

## 2024-08-27 DIAGNOSIS — I110 Hypertensive heart disease with heart failure: Secondary | ICD-10-CM

## 2024-08-27 DIAGNOSIS — N184 Chronic kidney disease, stage 4 (severe): Secondary | ICD-10-CM | POA: Diagnosis present

## 2024-08-27 DIAGNOSIS — Z85828 Personal history of other malignant neoplasm of skin: Secondary | ICD-10-CM

## 2024-08-27 DIAGNOSIS — I5032 Chronic diastolic (congestive) heart failure: Secondary | ICD-10-CM | POA: Diagnosis present

## 2024-08-27 DIAGNOSIS — K219 Gastro-esophageal reflux disease without esophagitis: Secondary | ICD-10-CM | POA: Diagnosis present

## 2024-08-27 DIAGNOSIS — Z8546 Personal history of malignant neoplasm of prostate: Secondary | ICD-10-CM

## 2024-08-27 DIAGNOSIS — I130 Hypertensive heart and chronic kidney disease with heart failure and stage 1 through stage 4 chronic kidney disease, or unspecified chronic kidney disease: Secondary | ICD-10-CM | POA: Diagnosis present

## 2024-08-27 DIAGNOSIS — I4821 Permanent atrial fibrillation: Secondary | ICD-10-CM

## 2024-08-27 DIAGNOSIS — D631 Anemia in chronic kidney disease: Secondary | ICD-10-CM | POA: Diagnosis present

## 2024-08-27 DIAGNOSIS — Z006 Encounter for examination for normal comparison and control in clinical research program: Secondary | ICD-10-CM | POA: Diagnosis not present

## 2024-08-27 DIAGNOSIS — Z7901 Long term (current) use of anticoagulants: Secondary | ICD-10-CM

## 2024-08-27 DIAGNOSIS — I4510 Unspecified right bundle-branch block: Secondary | ICD-10-CM | POA: Diagnosis present

## 2024-08-27 HISTORY — PX: LEFT ATRIAL APPENDAGE OCCLUSION: EP1229

## 2024-08-27 HISTORY — PX: TRANSESOPHAGEAL ECHOCARDIOGRAM (CATH LAB): EP1270

## 2024-08-27 LAB — TYPE AND SCREEN
ABO/RH(D): O POS
Antibody Screen: NEGATIVE

## 2024-08-27 LAB — POCT ACTIVATED CLOTTING TIME: Activated Clotting Time: 262 s

## 2024-08-27 LAB — ABO/RH: ABO/RH(D): O POS

## 2024-08-27 LAB — POCT I-STAT, CHEM 8
BUN: 61 mg/dL — ABNORMAL HIGH (ref 8–23)
Calcium, Ion: 1.14 mmol/L — ABNORMAL LOW (ref 1.15–1.40)
Chloride: 108 mmol/L (ref 98–111)
Creatinine, Ser: 2.5 mg/dL — ABNORMAL HIGH (ref 0.61–1.24)
Glucose, Bld: 102 mg/dL — ABNORMAL HIGH (ref 70–99)
HCT: 35 % — ABNORMAL LOW (ref 39.0–52.0)
Hemoglobin: 11.9 g/dL — ABNORMAL LOW (ref 13.0–17.0)
Potassium: 3.9 mmol/L (ref 3.5–5.1)
Sodium: 142 mmol/L (ref 135–145)
TCO2: 23 mmol/L (ref 22–32)

## 2024-08-27 LAB — ECHO TEE
AV Mean grad: 2 mmHg
AV Peak grad: 3.4 mmHg
Ao pk vel: 0.93 m/s
Est EF: 55
MV M vel: 4.73 m/s
MV Peak grad: 89.5 mmHg
Radius: 0.7 cm

## 2024-08-27 LAB — MRSA NEXT GEN BY PCR, NASAL: MRSA by PCR Next Gen: NOT DETECTED

## 2024-08-27 SURGERY — LEFT ATRIAL APPENDAGE OCCLUSION
Anesthesia: General

## 2024-08-27 MED ORDER — DEXAMETHASONE SODIUM PHOSPHATE 10 MG/ML IJ SOLN
INTRAMUSCULAR | Status: DC | PRN
  Administered 2024-08-27: 10 mg via INTRAVENOUS

## 2024-08-27 MED ORDER — APIXABAN 2.5 MG PO TABS
2.5000 mg | ORAL_TABLET | Freq: Two times a day (BID) | ORAL | Status: DC
  Administered 2024-08-27: 2.5 mg via ORAL
  Filled 2024-08-27: qty 1

## 2024-08-27 MED ORDER — PHENYLEPHRINE 80 MCG/ML (10ML) SYRINGE FOR IV PUSH (FOR BLOOD PRESSURE SUPPORT)
PREFILLED_SYRINGE | INTRAVENOUS | Status: DC | PRN
  Administered 2024-08-27 (×5): 80 ug via INTRAVENOUS

## 2024-08-27 MED ORDER — HEPARIN SODIUM (PORCINE) 1000 UNIT/ML IJ SOLN
INTRAMUSCULAR | Status: DC | PRN
Start: 2024-08-27 — End: 2024-08-27
  Administered 2024-08-27: 5000 [IU] via INTRAVENOUS
  Administered 2024-08-27: 12000 [IU] via INTRAVENOUS

## 2024-08-27 MED ORDER — ONDANSETRON HCL 4 MG/2ML IJ SOLN
INTRAMUSCULAR | Status: DC | PRN
  Administered 2024-08-27: 4 mg via INTRAVENOUS

## 2024-08-27 MED ORDER — CHLORHEXIDINE GLUCONATE 4 % EX SOLN
Freq: Once | CUTANEOUS | Status: DC
  Filled 2024-08-27: qty 15

## 2024-08-27 MED ORDER — SODIUM CHLORIDE 0.9% FLUSH: 3.0000 mL | INTRAVENOUS | Status: DC | PRN

## 2024-08-27 MED ORDER — SODIUM CHLORIDE 0.9 % IV SOLN: INTRAVENOUS | Status: DC

## 2024-08-27 MED ORDER — ONDANSETRON HCL 4 MG/2ML IJ SOLN: 4.0000 mg | Freq: Four times a day (QID) | INTRAMUSCULAR | Status: DC | PRN

## 2024-08-27 MED ORDER — ROCURONIUM BROMIDE 10 MG/ML (PF) SYRINGE
PREFILLED_SYRINGE | INTRAVENOUS | Status: DC | PRN
Start: 2024-08-27 — End: 2024-08-27
  Administered 2024-08-27: 20 mg via INTRAVENOUS
  Administered 2024-08-27: 60 mg via INTRAVENOUS
  Administered 2024-08-27: 10 mg via INTRAVENOUS

## 2024-08-27 MED ORDER — IODIXANOL 320 MG/ML IV SOLN
INTRAVENOUS | Status: DC | PRN
  Administered 2024-08-27: 20 mL

## 2024-08-27 MED ORDER — ORAL CARE MOUTH RINSE: 15.0000 mL | OROMUCOSAL | Status: DC | PRN

## 2024-08-27 MED ORDER — SUGAMMADEX SODIUM 200 MG/2ML IV SOLN
INTRAVENOUS | Status: DC | PRN
  Administered 2024-08-27: 200 mg via INTRAVENOUS

## 2024-08-27 MED ORDER — ACETAMINOPHEN 325 MG PO TABS: 650.0000 mg | ORAL_TABLET | ORAL | Status: DC | PRN

## 2024-08-27 MED ORDER — FENTANYL CITRATE (PF) 250 MCG/5ML IJ SOLN
INTRAMUSCULAR | Status: DC | PRN
  Administered 2024-08-27: 100 ug via INTRAVENOUS

## 2024-08-27 MED ORDER — CHLORHEXIDINE GLUCONATE 0.12 % MT SOLN
OROMUCOSAL | Status: AC
  Filled 2024-08-27: qty 15

## 2024-08-27 MED ORDER — PROPOFOL 10 MG/ML IV BOLUS
INTRAVENOUS | Status: DC | PRN
  Administered 2024-08-27: 50 mg via INTRAVENOUS

## 2024-08-27 MED ORDER — SODIUM CHLORIDE 0.9 % IV SOLN: 250.0000 mL | INTRAVENOUS | Status: DC | PRN

## 2024-08-27 MED ORDER — HEPARIN SODIUM (PORCINE) 1000 UNIT/ML IJ SOLN
INTRAMUSCULAR | Status: AC
  Filled 2024-08-27: qty 10

## 2024-08-27 MED ORDER — HEPARIN (PORCINE) IN NACL 2000-0.9 UNIT/L-% IV SOLN
INTRAVENOUS | Status: DC | PRN
  Administered 2024-08-27: 1000 mL

## 2024-08-27 MED ORDER — PHENYLEPHRINE HCL-NACL 20-0.9 MG/250ML-% IV SOLN
INTRAVENOUS | Status: DC | PRN
  Administered 2024-08-27: 10 ug/min via INTRAVENOUS

## 2024-08-27 MED ORDER — LIDOCAINE 2% (20 MG/ML) 5 ML SYRINGE
INTRAMUSCULAR | Status: DC | PRN
Start: 2024-08-27 — End: 2024-08-27
  Administered 2024-08-27: 40 mg via INTRAVENOUS

## 2024-08-27 MED ORDER — FENTANYL CITRATE (PF) 100 MCG/2ML IJ SOLN
INTRAMUSCULAR | Status: AC
  Filled 2024-08-27: qty 2

## 2024-08-27 MED ORDER — CEFAZOLIN SODIUM-DEXTROSE 2-4 GM/100ML-% IV SOLN
2.0000 g | INTRAVENOUS | Status: AC
  Administered 2024-08-27: 2 g via INTRAVENOUS
  Filled 2024-08-27: qty 100

## 2024-08-27 MED ORDER — PROTAMINE SULFATE 10 MG/ML IV SOLN
INTRAVENOUS | Status: DC | PRN
Start: 2024-08-27 — End: 2024-08-27
  Administered 2024-08-27: 30 mg via INTRAVENOUS

## 2024-08-27 MED ORDER — SODIUM CHLORIDE 0.9% FLUSH
3.0000 mL | Freq: Two times a day (BID) | INTRAVENOUS | Status: DC
  Administered 2024-08-27: 3 mL via INTRAVENOUS

## 2024-08-27 MED ORDER — VASOPRESSIN 20 UNIT/ML IV SOLN
INTRAVENOUS | Status: DC | PRN
  Administered 2024-08-27 (×2): 1 [IU] via INTRAVENOUS

## 2024-08-27 SURGICAL SUPPLY — 23 items
BLANKET WARM UNDERBOD FULL ACC (MISCELLANEOUS) ×1 IMPLANT
CATH INFINITI 5FR ANG PIGTAIL (CATHETERS) IMPLANT
CATH NUVISION NON NAV ICE (CATHETERS) IMPLANT
CLOSURE PERCLOSE PROSTYLE (VASCULAR PRODUCTS) IMPLANT
DEVICE WATCHMAN FLX PRO PROC (KITS) IMPLANT
DEVICE WATCHMAN FXD CRV PROC (KITS) IMPLANT
DILATOR VESSEL 38 20CM 12FR (INTRODUCER) IMPLANT
ELECT DEFIB PAD ADLT CADENCE (PAD) IMPLANT
GUIDEWIRE INQWIRE 1.5J.035X260 (WIRE) ×1 IMPLANT
INTRODUCER PERFORM 12 30 .038 (SHEATH) IMPLANT
KIT VERSACROSS CON 12FR 85 (KITS) IMPLANT
PACK CARDIAC CATHETERIZATION (CUSTOM PROCEDURE TRAY) ×1 IMPLANT
PAD DEFIB RADIO PHYSIO CONN (PAD) ×1 IMPLANT
SHEATH PERFORMER 16FR 30 (SHEATH) IMPLANT
SHEATH PINNACLE 8F 10CM (SHEATH) IMPLANT
SHEATH PROBE COVER 6X72 (BAG) ×1 IMPLANT
SHIELD RADPAD SCOOP 12X17 (MISCELLANEOUS) ×1 IMPLANT
SYSTEM WATCHMAN FXD DBL (SHEATH) IMPLANT
TRANSDUCER W/STOPCOCK (MISCELLANEOUS) ×1 IMPLANT
TUBING ART PRESS 72 MALE/FEM (TUBING) IMPLANT
TUBING CIL FLEX 10 FLL-RA (TUBING) ×1 IMPLANT
WATCHMAN FLX PRO 24 (Prosthesis & Implant Heart) IMPLANT
WIRE HITORQ VERSACORE ST 145CM (WIRE) IMPLANT

## 2024-08-27 NOTE — Anesthesia Postprocedure Evaluation (Signed)
 Anesthesia Post Note  Patient: Daniel Love  Procedure(s) Performed: LEFT ATRIAL APPENDAGE OCCLUSION TRANSESOPHAGEAL ECHOCARDIOGRAM     Patient location during evaluation: PACU Anesthesia Type: General Level of consciousness: awake and alert Pain management: pain level controlled Vital Signs Assessment: post-procedure vital signs reviewed and stable Respiratory status: spontaneous breathing, nonlabored ventilation, respiratory function stable and patient connected to nasal cannula oxygen Cardiovascular status: blood pressure returned to baseline and stable Postop Assessment: no apparent nausea or vomiting Anesthetic complications: no   There were no known notable events for this encounter.  Last Vitals:  Vitals:   08/27/24 1030 08/27/24 1052  BP: 92/66 97/63  Pulse: (!) 56 62  Resp: 12 15  Temp:    SpO2: 99% 97%    Last Pain:  Vitals:   08/27/24 1052  TempSrc: Oral  PainSc: 0-No pain                 [name] Cornea

## 2024-08-27 NOTE — Transfer of Care (Signed)
 Immediate Anesthesia Transfer of Care Note  Patient: Daniel Love  Procedure(s) Performed: LEFT ATRIAL APPENDAGE OCCLUSION TRANSESOPHAGEAL ECHOCARDIOGRAM  Patient Location: cath lab  Anesthesia Type:General  Level of Consciousness: awake, alert , and oriented  Airway & Oxygen Therapy: Patient connected to nasal cannula oxygen  Post-op Assessment: Report given to RN and Post -op Vital signs reviewed and stable  Post vital signs: stable  Last Vitals:  Vitals Value Taken Time  BP 106/68 08/27/24 09:40  Temp    Pulse 62 08/27/24 09:41  Resp 15 08/27/24 09:41  SpO2 99 % 08/27/24 09:41  Vitals shown include unfiled device data.  Last Pain:  Vitals:   08/27/24 0639  TempSrc:   PainSc: 0-No pain         Complications: There were no known notable events for this encounter.

## 2024-08-27 NOTE — TOC CM/SW Note (Signed)
 Transition of Care [facility]) - Inpatient Brief Assessment   Patient Details  Name: Daniel Love MRN: [id number] Date of Birth: May 07, 1940  [facility] CM/SW Contact:    Lauraine FORBES Saa, LCSWA Phone Number: 08/27/2024, 11:25 AM   Clinical Narrative:  11:25 AM Per chart review, patient resides at home with child(ren). Patient has a PCP and insurance. Patient does not have SNF/HH/DME history. Patient's preferred pharmacy is [facility] [phone]. No TOC needs were identified at this time. TOC will continue to follow and be available to assist.  Transition of Care Asessment: Insurance and Status: Insurance coverage has been reviewed Patient has primary care physician: Yes Home environment has been reviewed: Private Residence Prior level of function:: N/A Prior/Current Home Services: No current home services Social Drivers of Health Review: SDOH reviewed no interventions necessary Readmission risk has been reviewed: Yes (Currently Green 12%) Transition of care needs: no transition of care needs at this time

## 2024-08-27 NOTE — Discharge Summary (Signed)
 Electrophysiology Discharge Summary   Patient ID: Daniel Love,  MRN: [id number], DOB/AGE: 84/04/1940 84 y.o.  Admit date: 08/27/2024 Discharge date: 08/27/2024  Primary Care Physician: Chinita Hoy CROME, PA-C  Primary Cardiologist: Stanly DELENA Leavens, MD  Electrophysiologist: OLE ONEIDA HOLTS, MD  Primary Discharge Diagnosis:  Permanent Atrial Fibrillation Poor candidacy for long term anticoagulation due to recurrent GIBs  Secondary Discharge Diagnosis:  VHD HFpEF CKD (IV)  Procedures This Admission:  Transeptal Puncture Intra-procedural TEE  Left atrial appendage occlusive device placement on 08/27/24 by Dr. HOLTS.  CONCLUSIONS:  1.Successful implantation of a WATCHMAN left atrial appendage occlusive device    2. TEE demonstrating no LAA thrombus 3. ICE demonstrating no LAA thrombus. 4. No early apparent complications.   Post Implant Anticoagulation Strategy: Continue Eliquis  2.5mg  by mouth twice daily for 6 months after implant. After 6 months, stop Eliquis  and start Aspirin 81mg  by mouth once daily. Plan for TEE after 60 days to assess appendage patency and Watchman position.    Brief HPI: Daniel Love is a 84 y.o. male with a history of Permanent Atrial Fibrillation who was referred to Electrophysiology in the outpatient setting    Hospital Course:  The patient was admitted and underwent left atrial appendage occlusive device placement as above.  The patient was monitored in the post procedure setting and has done very well with no concerns. Given this, he is being considered for same day discharge later today. Groin site has been stable without evidence of hematoma or bleeding. Wound care and restrictions were reviewed with the patient.   The patient has been scheduled for post procedure follow up with EP APP in approximately 6 weeks. They will restart Eliquis  this evening and continue for 45 days then stop. At that time he will transition to Plavix  75mg  daily to complete 6 months of therapy. They will require dental SBE for 6 month post op and should refrain from dental work or cleanings for the first 45 days post implant. SBE to be RXd at follow up.   A repeat TEE (due to CKD) will be performed in approximately 60 days to ensure proper seal of the device.   Teaching/education completed with patient and family at bedside   Physical Exam: Vitals:   08/27/24 1012 08/27/24 1015 08/27/24 1030 08/27/24 1052  BP:  95/63 92/66 97/63   Pulse:  61 (!) 56 62  Resp:  12 12 15   Temp: (!) 97.4 F (36.3 C)     TempSrc: Temporal   Oral  SpO2:  95% 99% 97%  Weight:    78.8 kg  Height:    5' 10 (1.778 m)    GEN: Well nourished, well developed in no acute distress NECK: No JVD; No carotid bruits CARDIAC: Irregularly irregular rate and rhythm, no murmurs, rubs, gallops RESPIRATORY:  Clear to auscultation without rales, wheezing or rhonchi  ABDOMEN: Soft, non-tender, non-distended EXTREMITIES:  No edema; No deformity. Groin site Stable     Discharge Medications:  Allergies as of 08/27/2024       Reactions   Amlodipine  Hives, Swelling   Lumigan [bimatoprost]    Puffiness, redness   Lisinopril Cough        Medication List     TAKE these medications    acetaminophen  500 MG tablet Commonly known as: TYLENOL  Take 1,000 mg by mouth every 6 (six) hours as needed (for pain.). Notes to patient: You have 3 listed medications with acetaminophen , use caution and do not exceed  total recommended daily amount.    allopurinol 100 MG tablet Commonly known as: ZYLOPRIM Take 150 mg by mouth daily.   apixaban  2.5 MG Tabs tablet Commonly known as: ELIQUIS  Take 1 tablet (2.5 mg total) by mouth 2 (two) times daily. Notes to patient: Take this evening's dose late at ~ 11:00PM, resume usual dosing schedule tomorrow   clobetasol cream 0.05 % Commonly known as: TEMOVATE Apply 1 Application topically 2 (two) times daily as needed (irritation).    clotrimazole-betamethasone cream Commonly known as: LOTRISONE Apply 1 Application topically daily as needed (rash).   colchicine 0.6 MG tablet Take 0.6 mg by mouth daily as needed (gout flare).   Darbepoetin Alfa 300 MCG/0.6ML Sosy injection Commonly known as: ARANESP Inject 300 mcg into the skin every 21 ( twenty-one) days.   diphenhydramine-acetaminophen  25-500 MG Tabs tablet Commonly known as: TYLENOL  PM Take 1 tablet by mouth at bedtime as needed (sleep). Notes to patient: You have 3 listed medications with acetaminophen , use caution and do not exceed total recommended daily amount.    Farxiga  10 MG Tabs tablet Generic drug: dapagliflozin  propanediol TAKE 1 TABLET BY MOUTH DAILY BEFORE BREAKFAST   ferrous sulfate 325 (65 FE) MG tablet Take 325 mg by mouth daily with breakfast.   furosemide  40 MG tablet Commonly known as: LASIX  Take 1 tablet (40 mg total) by mouth 2 (two) times daily.   GOLD BOND PAIN & ITCH RELIEF EX Apply 1 Application topically as needed (pain).   ICY HOT BACK EX Apply 1 application topically daily as needed (pain).   isosorbide  mononitrate 30 MG 24 hr tablet Commonly known as: IMDUR  Take 15 mg by mouth in the morning.   latanoprost  0.005 % ophthalmic solution Commonly known as: XALATAN  Place 1 drop into both eyes at bedtime.   melatonin 5 MG Tabs Take 10 mg by mouth at bedtime.   multivitamin with minerals Tabs tablet Take 1 tablet by mouth in the morning. Centrum Silver   olmesartan  40 MG tablet Commonly known as: BENICAR  TAKE 1 TABLET BY MOUTH DAILY   oxymetazoline  0.05 % nasal spray Commonly known as: AFRIN Place 1 spray into both nostrils at bedtime as needed for congestion.   Phenylephrine -Acetaminophen  5-325 MG Tabs Take 325 mg by mouth as needed. Notes to patient: You have 3 listed medications with acetaminophen , use caution and do not exceed total recommended daily amount.    timolol  0.5 % ophthalmic solution Commonly known  as: TIMOPTIC  Place 1 drop into both eyes 2 (two) times daily.   Voltaren 1 % Gel Generic drug: diclofenac Sodium Apply 1 Application topically as needed (pain).        Disposition:  Home with usual follow up as in AVS  Duration of Discharge Encounter:  APP Time:  Signed, Charlies Macario Arthur, PA-C  08/27/2024 1:30 PM

## 2024-08-27 NOTE — H&P (Signed)
 Electrophysiology Office Note:     Date:  08/27/2024    ID:  Daniel Love, Daniel Love 23-Nov-1940, MRN [id number]   [facility] Cardiologist:  Stanly DELENA Leavens, MD  [facility] Electrophysiologist:  Daniel ONEIDA HOLTS, MD    Referring MD: Daniel Love, [place]*    Chief Complaint: Atrial fibrillation   History of Present Illness:     Mr. Daniel Love is an 84 year old man who I am seeing today for an evaluation of atrial fibrillation at the request of Daniel Love. The patient last saw Daniel Love January 14, 2024.   The patient has a history of permanent atrial fibrillation, stage IV CKD, HFpEF, hypertension.  He has seen Dr. Leavens in the past, most recently November 18, 2023.  The patient also has frequent GI bleeding.  His GI bleeding and anemia has required cessation of anticoagulation.  At the last appointment, Dr. Leavens discussed watchman and referred.   The patient is accompanied by his daughter today who is very involved in his healthcare.  He has been on Coumadin  for many many years.  His Coumadin  therapy has been complicated by drug interactions, fluctuating INRs and GI bleeding.  He recently cut his Coumadin  dose in half and has not had his INR rechecked after making that change.  Presents for [employer] implant. Procedure reviewed.     Objective Their past medical, social and family history was reviewed.     ROS:   Please see the history of present illness.    All other systems reviewed and are negative.   EKGs/Labs/Other Studies Reviewed:     The following studies were reviewed today:   December 05, 2023 transesophageal echo EF 55-60 Moderately reduced RV function Severely dilated left atrium Appendage without thrombus and amenable to closure Moderate to severe MR Severe TR Mild to moderate AI   October 25, 2023 EKG shows atrial fibrillation, right bundle branch block            Physical Exam:     VS:  BP 117/71   Pulse 69    Ht 5' 9 (1.753 m)   Wt 182 lb 12.8 oz (82.9 kg)   SpO2 99%   BMI 26.99 kg/m         Wt Readings from Last 3 Encounters:  02/05/24 182 lb 12.8 oz (82.9 kg)  02/03/24 183 lb 9.6 oz (83.3 kg)  12/05/23 181 lb (82.1 kg)      GEN: no distress, elderly CARD: Irregularly irregular, No MRG RESP: No IWOB. CTAB.         Assessment ASSESSMENT AND PLAN:     1. Permanent atrial fibrillation (HCC)   2. Chronic heart failure with preserved ejection fraction (HCC)       #Permanent atrial fibrillation #History of GI bleeding #Anemia of chronic kidney disease, CKD 4 The patient has a long history of atrial fibrillation with inconsistent use of anticoagulation for stroke risk mitigation given trouble with GI bleeding and anemia.  Left atrial appendage occlusion has been discussed with the patient by his primary cardiologist, Dr. Leavens.  I discussed the Watchman procedure in detail during today's clinic appointment.  We have discussed the procedure, its risks, likelihood of success and recovery.  We have discussed the need for short-term anticoagulation around the time of watchman implant.   To start, I would like to stop his Coumadin  and transition him to Eliquis .  I will ask our pharmacist to assist with this transition.  If he continues to have  trouble with bleeding, I think he would be a good candidate for left atrial appendage occlusion.   --------------------   I have seen Daniel Love in the office today who is being considered for a Watchman left atrial appendage closure device. I believe they will benefit from this procedure given their history of atrial fibrillation, CHA2DS2-VASc score of 4. Unfortunately, the patient is not felt to be a long term anticoagulation candidate secondary to history of GI bleeding. The patient's chart has been reviewed and I feel that they would be a candidate for short term oral anticoagulation after Watchman implant.    It is my belief that  after undergoing a LAA closure procedure, Daniel Love will not need long term anticoagulation which eliminates anticoagulation side effects and major bleeding risk.    Procedural risks for the Watchman implant have been reviewed with the patient including a 0.5% risk of stroke, <1% risk of perforation and <1% risk of device embolization. Other risks include bleeding, vascular damage, tamponade, worsening renal function, and death. The patient understands these risks.     The published clinical data on the safety and effectiveness of WATCHMAN include but are not limited to the following: - Holmes DR, [name], Sick P et al. for the PROTECT AF Investigators. Percutaneous closure of the left atrial appendage versus warfarin therapy for prevention of stroke in patients with atrial fibrillation: a randomised non-inferiority trial. Lancet 2009; 374: 534-42. [name], Doshi SK, Jonita VEAR [name] et al. on behalf of the PROTECT AF Investigators. Percutaneous Left Atrial Appendage Closure for Stroke Prophylaxis in Patients With Atrial Fibrillation 2.3-Year Follow-up of the PROTECT AF (Watchman Left Atrial Appendage System for Embolic Protection in Patients With Atrial Fibrillation) Trial. Circulation 2013; 127:720-729. - Alli O, Doshi S,  Kar S, Reddy VY, Sievert H et al. Quality of Life Assessment in the Randomized PROTECT AF (Percutaneous Closure of the Left Atrial Appendage Versus Warfarin Therapy for Prevention of Stroke in Patients With Atrial Fibrillation) Trial of Patients at Risk for Stroke With Nonvalvular Atrial Fibrillation. J Am Coll Cardiol 2013; 61:1790-8. [name], Archer RAMAN, Price M, Whisenant B, Sievert H, Doshi S, Huber K, Reddy V. Prospective randomized evaluation of the Watchman left atrial appendage Device in patients with atrial fibrillation versus long-term warfarin therapy; the PREVAIL trial. Journal of the [employer] of Cardiology, Vol. 4, No. 1, 2014, 1-11. - Kar S, Doshi SK,  Sadhu A, Horton R, Osorio J et al. Primary outcome evaluation of a next-generation left atrial appendage closure device: results from the PINNACLE FLX trial. Circulation 2021;143(18)1754-1762.        No CT planned given CKD 4.   HAS-BLED score 3 Hypertension Yes  Abnormal renal and liver function (Dialysis, transplant, Cr >2.26 mg/dL /Cirrhosis or Bilirubin >2x Normal or AST/ALT/AP >3x Normal) No  Stroke No  Bleeding Yes  Labile INR (Unstable/high INR) No  Elderly (>65) Yes  Drugs or alcohol (>= 8 drinks/week, anti-plt or NSAID) No    CHA2DS2-VASc Score = 4  The patient's score is based upon: CHF History: 1 HTN History: 1 Diabetes History: 0 Stroke History: 0 Vascular Disease History: 0 Age Score: 2 Gender Score: 0     #Chronic diastolic heart failure NYHA class II. Warm on exam.  Continue medical therapy as directed by Dr. Santo.     Presents for [employer] implant today. Plan for TEE and ICE guidance. Procedure reviewed.    Signed, Daniel DASEN. Cindie, MD, [facility], [facility]  08/27/2024 Electrophysiology [facility]

## 2024-08-27 NOTE — Discharge Instructions (Signed)
 [facility] Procedure, Care After  Procedure MD: Dr. Isidoro Donning Clinical Coordinator: Karsten Fells, RN  This sheet gives you information about how to care for yourself after your procedure. Your health care provider may also give you more specific instructions. If you have problems or questions, contact your health care provider.  What can I expect after the procedure? After the procedure, it is common to have: Bruising around your puncture site. Tenderness around your puncture site. Tiredness (fatigue).  Medication instructions It is very important to continue to take your blood thinner as directed by your doctor after the Watchman procedure. Call your procedure doctor's office with question or concerns. If you are on Coumadin (warfarin), you will have your INR checked the week after your procedure, with a goal INR of 2.0 - 3.0. Please follow your medication instructions on your discharge summary. Only take the medications listed on your discharge paperwork.  Follow up You will be seen in 6 weeks after your procedure You will have a repeat CT scan or Echocardiogram approximately 8 weeks after your procedure mark to check your device You will follow up the MD/APP who performed your procedure 6 months after your procedure The Watchman Clinical Coordinator will check in with you from time to time, including 1 and 2 years after your procedure.  NO DENTAL CLEANINGS FOR 45 days. After that, you will require antibiotics for dental procedures the first 6 months.   Follow these instructions at home: Puncture site care  Follow instructions from your health care provider about how to take care of your puncture site. Make sure you: If present, leave stitches (sutures), skin glue, or adhesive strips in place.  If a large square bandage is present, this may be removed 24 hours after surgery.  Check your puncture site every day for signs of infection. Check for: Redness, swelling, or pain. Fluid  or blood. If your puncture site starts to bleed, lie down on your back, apply firm pressure to the area, and contact your health care provider. Warmth. Pus or a bad smell. Driving Do not drive yourself home if you received sedation Do not drive for at least 4 days after your procedure or however long your health care provider recommends. (Do not resume driving if you have previously been instructed not to drive for other health reasons.) Do not spend greater than 1 hour at a time in a car for the first 3 days. Stop and take a break with a 5 minute walk at least every hour.  Do not drive or use heavy machinery while taking prescription pain medicine.  Activity Avoid activities that take a lot of effort, including exercise, for at least 7 days after your procedure. For the first 3 days, avoid sitting for longer than one hour at a time.  Avoid alcoholic beverages, signing paperwork, or participating in legal proceedings for 24 hours after receiving sedation Do not lift anything that is heavier than 10 lb (4.5 kg) for one week.  No sexual activity for 1 week.  Return to your normal activities as told by your health care provider. Ask your health care provider what activities are safe for you. General instructions Take over-the-counter and prescription medicines only as told by your health care provider. Do not use any products that contain nicotine or tobacco, such as cigarettes and e-cigarettes. If you need help quitting, ask your health care provider. You may shower after 24 hours, but Do not take baths, swim, or use a hot tub for  1 week.  Do not drink alcohol for 24 hours after your procedure. Keep all follow-up visits as told by your health care provider. This is important. Dental Work: You will require antibiotics prior to any dental work, including cleanings, for 6 months after your Watchman implantation to help protect you from infection. After 6 months, antibiotics are no longer  required. Contact a health care provider if: You have redness, mild swelling, or pain around your puncture site. You have soreness in your throat or at your puncture site that does not improve after several days You have fluid or blood coming from your puncture site that stops after applying firm pressure to the area. Your puncture site feels warm to the touch. You have pus or a bad smell coming from your puncture site. You have a fever. You have chest pain or discomfort that spreads to your neck, jaw, or arm. You are sweating a lot. You feel nauseous. You have a fast or irregular heartbeat. You have shortness of breath. You are dizzy or light-headed and feel the need to lie down. You have pain or numbness in the arm or leg closest to your puncture site. Get help right away if: Your puncture site suddenly swells. Your puncture site is bleeding and the bleeding does not stop after applying firm pressure to the area. These symptoms may represent a serious problem that is an emergency. Do not wait to see if the symptoms will go away. Get medical help right away. Call your local emergency services (911 in the [place] Do not drive yourself to the hospital. Summary After the procedure, it is normal to have bruising and tenderness at the puncture site in your groin, neck, or forearm. Check your puncture site every day for signs of infection. Get help right away if your puncture site is bleeding and the bleeding does not stop after applying firm pressure to the area. This is a medical emergency.  This information is not intended to replace advice given to you by your health care provider. Make sure you discuss any questions you have with your health care provider.

## 2024-08-27 NOTE — TOC Transition Note (Signed)
 Transition of Care [facility]) - Discharge Note   Patient Details  Name: EMIDIO WARRELL MRN: [id number] Date of Birth: 1940-02-28  [facility] CM/SW Contact:  Andrez JULIANNA George, RN Phone Number: 08/27/2024, 2:10 PM   Clinical Narrative:     Pt is discharging home with self care. No needs per IP Care management.   Final next level of care: Home/Self Care Barriers to Discharge: No Barriers Identified   Patient Goals and CMS Choice            Discharge Placement                       Discharge Plan and Services Additional resources added to the After Visit Summary for                                       Social Drivers of Health (SDOH) Interventions SDOH Screenings   Food Insecurity: No Food Insecurity (08/27/2024)  Housing: Low Risk  (08/27/2024)  Transportation Needs: No Transportation Needs (08/27/2024)  Utilities: Not At Risk (08/27/2024)  Depression (PHQ2-9): Low Risk  (02/04/2019)  Financial Resource Strain: Low Risk  (01/14/2024)   Received from [facility]  Physical Activity: Unknown (10/14/2023)   Received from [facility]  Social Connections: Socially Integrated (08/27/2024)  Stress: No Stress Concern Present (10/14/2023)   Received from [facility]  Tobacco Use: Medium Risk (08/27/2024)     Readmission Risk Interventions     No data to display

## 2024-08-27 NOTE — Plan of Care (Signed)
  Problem: Education: Goal: Knowledge of cardiac device and self-care will improve Outcome: Progressing Goal: Ability to safely manage health related needs after discharge will improve Outcome: Progressing   Problem: Cardiac: Goal: Ability to achieve and maintain adequate cardiopulmonary perfusion will improve Outcome: Progressing   Problem: Health Behavior/Discharge Planning: Goal: Ability to manage health-related needs will improve Outcome: Progressing   Problem: Clinical Measurements: Goal: Ability to maintain clinical measurements within normal limits will improve Outcome: Progressing Goal: Will remain free from infection Outcome: Progressing Goal: Diagnostic test results will improve Outcome: Progressing   Problem: Activity: Goal: Risk for activity intolerance will decrease Outcome: Progressing   Problem: Nutrition: Goal: Adequate nutrition will be maintained Outcome: Progressing

## 2024-08-27 NOTE — Anesthesia Procedure Notes (Signed)
 Procedure Name: Intubation Date/Time: 08/27/2024 8:04 AM  Performed by: Moishe Reyes CROME, CRNAPre-anesthesia Checklist: Patient identified, Emergency Drugs available, Suction available, Patient being monitored and Timeout performed Preoxygenation: Pre-oxygenation with 100% oxygen Induction Type: IV induction Ventilation: Mask ventilation without difficulty Laryngoscope Size: McGrath Grade View: Grade I Tube type: Oral Tube size: 7.0 mm Number of attempts: 1 Airway Equipment and Method: Stylet and Video-laryngoscopy Placement Confirmation: ETT inserted through vocal cords under direct vision, positive ETCO2, CO2 detector and breath sounds checked- equal and bilateral Secured at: 21 cm Tube secured with: Tape Dental Injury: Teeth and Oropharynx as per pre-operative assessment

## 2024-08-27 NOTE — Anesthesia Preprocedure Evaluation (Addendum)
 Anesthesia Evaluation  Patient identified by MRN, date of birth, ID band Patient awake    Reviewed: Allergy & Precautions, NPO status , Patient's Chart, lab work & pertinent test results, reviewed documented beta blocker date and time   History of Anesthesia Complications Negative for: history of anesthetic complications  Airway Mallampati: II  TM Distance: >3 FB     Dental no notable dental hx.    Pulmonary neg COPD, former smoker   breath sounds clear to auscultation       Cardiovascular hypertension, (-) CAD, (-) Past MI and (-) Cardiac Stents + dysrhythmias Atrial Fibrillation  Rhythm:Irregular Rate:Tachycardia  IMPRESSIONS     1. Left ventricular ejection fraction, by estimation, is 55 to 60%. The  left ventricle has normal function.   2. Right ventricular systolic function is moderately reduced. The right  ventricular size is severely enlarged. There is severely elevated  pulmonary artery systolic pressure. The estimated right ventricular  systolic pressure is 111.2 mmHg.   3. 3D assesment of LAA- suitable for a 24 mm Watchman FLX Pro. No  barriers to transeptal puncture.. Left atrial size was severely dilated.  No left atrial/left atrial appendage thrombus was detected.   4. Right atrial size was severely dilated.   5. The mitral valve is myxomatous. Moderate to severe mitral valve  regurgitation. No evidence of mitral stenosis. The mean mitral valve  gradient is 1.0 mmHg.   6. Severe, TR. Thought leaflet appears myxomatous, the mechanism is  central related to annular dilation. Imaging is suitable for potential  tTEER. The tricuspid valve is abnormal. Tricuspid valve regurgitation is  severe.   7. The aortic valve is tricuspid. Aortic valve regurgitation is mild to  moderate.   8. Aortic dilatation noted. There is moderate dilatation of the ascending  aorta, measuring 45 mm.     Neuro/Psych neg Seizures     GI/Hepatic ,GERD  ,,  Endo/Other    Renal/GU CRFRenal disease     Musculoskeletal  (+) Arthritis , Osteoarthritis,    Abdominal   Peds  Hematology  (+) Blood dyscrasia, anemia   Anesthesia Other Findings   Reproductive/Obstetrics                              Anesthesia Physical Anesthesia Plan  ASA: 3  Anesthesia Plan: General   Post-op Pain Management:    Induction: Intravenous  PONV Risk Score and Plan: 2 and Ondansetron  and Dexamethasone   Airway Management Planned: Oral ETT  Additional Equipment: Arterial line  Intra-op Plan:   Post-operative Plan: Extubation in OR  Informed Consent: I have reviewed the patients History and Physical, chart, labs and discussed the procedure including the risks, benefits and alternatives for the proposed anesthesia with the patient or authorized representative who has indicated his/her understanding and acceptance.     Dental advisory given  Plan Discussed with: CRNA  Anesthesia Plan Comments:          Anesthesia Quick Evaluation

## 2024-08-28 ENCOUNTER — Encounter (HOSPITAL_COMMUNITY): Payer: Self-pay | Admitting: Cardiology

## 2024-08-28 DIAGNOSIS — N184 Chronic kidney disease, stage 4 (severe): Secondary | ICD-10-CM | POA: Diagnosis not present

## 2024-08-28 DIAGNOSIS — C61 Malignant neoplasm of prostate: Secondary | ICD-10-CM | POA: Diagnosis not present

## 2024-08-28 MED FILL — Fentanyl Citrate Preservative Free (PF) Inj 100 MCG/2ML: INTRAMUSCULAR | Qty: 2 | Status: AC

## 2024-09-01 ENCOUNTER — Other Ambulatory Visit: Payer: Self-pay

## 2024-09-01 ENCOUNTER — Telehealth: Payer: Self-pay

## 2024-09-01 DIAGNOSIS — I4821 Permanent atrial fibrillation: Secondary | ICD-10-CM

## 2024-09-01 DIAGNOSIS — Z95818 Presence of other cardiac implants and grafts: Secondary | ICD-10-CM

## 2024-09-01 NOTE — Telephone Encounter (Signed)
  [facility]   Watchman Team  Contacted the patient regarding discharge from [facility] on 08/27/2024  The patient understands to follow up with Jodie Passey on 10/12/2024  The patient understands discharge instructions? Yes  The patient understands medications and regimen? Yes   The patient reports groin site looks healthy with no S/S of bleeding or infection  The patient understands to call with any questions or concerns prior to scheduled visit.   Will plan for TEE on 10/19/2024.  The patient was grateful for call and agreed with plan.

## 2024-09-03 ENCOUNTER — Encounter: Payer: Self-pay | Admitting: Physician Assistant

## 2024-09-03 ENCOUNTER — Ambulatory Visit: Attending: Physician Assistant | Admitting: Physician Assistant

## 2024-09-03 VITALS — BP 92/60 | HR 65 | Resp 14 | Ht 70.0 in | Wt 176.8 lb

## 2024-09-03 DIAGNOSIS — C61 Malignant neoplasm of prostate: Secondary | ICD-10-CM | POA: Diagnosis not present

## 2024-09-03 DIAGNOSIS — I7121 Aneurysm of the ascending aorta, without rupture: Secondary | ICD-10-CM | POA: Insufficient documentation

## 2024-09-03 DIAGNOSIS — I340 Nonrheumatic mitral (valve) insufficiency: Secondary | ICD-10-CM | POA: Insufficient documentation

## 2024-09-03 DIAGNOSIS — I080 Rheumatic disorders of both mitral and aortic valves: Secondary | ICD-10-CM | POA: Insufficient documentation

## 2024-09-03 DIAGNOSIS — I351 Nonrheumatic aortic (valve) insufficiency: Secondary | ICD-10-CM | POA: Insufficient documentation

## 2024-09-03 DIAGNOSIS — I119 Hypertensive heart disease without heart failure: Secondary | ICD-10-CM | POA: Insufficient documentation

## 2024-09-03 DIAGNOSIS — M1A09X1 Idiopathic chronic gout, multiple sites, with tophus (tophi): Secondary | ICD-10-CM | POA: Diagnosis not present

## 2024-09-03 DIAGNOSIS — Z5181 Encounter for therapeutic drug level monitoring: Secondary | ICD-10-CM | POA: Insufficient documentation

## 2024-09-03 DIAGNOSIS — I5032 Chronic diastolic (congestive) heart failure: Secondary | ICD-10-CM | POA: Insufficient documentation

## 2024-09-03 DIAGNOSIS — Z7901 Long term (current) use of anticoagulants: Secondary | ICD-10-CM | POA: Insufficient documentation

## 2024-09-03 DIAGNOSIS — K627 Radiation proctitis: Secondary | ICD-10-CM | POA: Insufficient documentation

## 2024-09-03 DIAGNOSIS — I4821 Permanent atrial fibrillation: Secondary | ICD-10-CM | POA: Diagnosis not present

## 2024-09-03 DIAGNOSIS — D6869 Other thrombophilia: Secondary | ICD-10-CM | POA: Diagnosis not present

## 2024-09-03 DIAGNOSIS — I361 Nonrheumatic tricuspid (valve) insufficiency: Secondary | ICD-10-CM | POA: Diagnosis not present

## 2024-09-04 ENCOUNTER — Other Ambulatory Visit: Payer: Self-pay | Admitting: Interventional Radiology

## 2024-09-04 ENCOUNTER — Ambulatory Visit: Payer: Self-pay | Admitting: Physician Assistant

## 2024-09-04 DIAGNOSIS — D3001 Benign neoplasm of right kidney: Secondary | ICD-10-CM

## 2024-09-04 LAB — URIC ACID: Uric Acid, Serum: 7 mg/dL (ref 4.0–8.0)

## 2024-09-04 NOTE — Progress Notes (Signed)
 Uric acid is slightly higher at 7.0-please clarify if he has been taking allopurinol as prescribed? Any interruptions? Changes in diet?

## 2024-09-08 DIAGNOSIS — D631 Anemia in chronic kidney disease: Secondary | ICD-10-CM | POA: Diagnosis not present

## 2024-09-08 DIAGNOSIS — N184 Chronic kidney disease, stage 4 (severe): Secondary | ICD-10-CM | POA: Diagnosis not present

## 2024-09-17 DIAGNOSIS — Z23 Encounter for immunization: Secondary | ICD-10-CM | POA: Diagnosis not present

## 2024-09-18 ENCOUNTER — Other Ambulatory Visit: Payer: Self-pay | Admitting: Internal Medicine

## 2024-09-24 DIAGNOSIS — Z23 Encounter for immunization: Secondary | ICD-10-CM | POA: Diagnosis not present

## 2024-09-29 DIAGNOSIS — N184 Chronic kidney disease, stage 4 (severe): Secondary | ICD-10-CM | POA: Diagnosis not present

## 2024-09-29 DIAGNOSIS — D631 Anemia in chronic kidney disease: Secondary | ICD-10-CM | POA: Diagnosis not present

## 2024-09-30 DIAGNOSIS — Z8546 Personal history of malignant neoplasm of prostate: Secondary | ICD-10-CM | POA: Diagnosis not present

## 2024-10-05 DIAGNOSIS — N184 Chronic kidney disease, stage 4 (severe): Secondary | ICD-10-CM | POA: Diagnosis not present

## 2024-10-06 DIAGNOSIS — D3002 Benign neoplasm of left kidney: Secondary | ICD-10-CM | POA: Diagnosis not present

## 2024-10-06 DIAGNOSIS — Z8546 Personal history of malignant neoplasm of prostate: Secondary | ICD-10-CM | POA: Diagnosis not present

## 2024-10-12 ENCOUNTER — Ambulatory Visit: Attending: Student | Admitting: Student

## 2024-10-12 ENCOUNTER — Encounter: Payer: Self-pay | Admitting: Student

## 2024-10-12 VITALS — BP 100/60 | HR 61 | Ht 70.0 in | Wt 176.0 lb

## 2024-10-12 DIAGNOSIS — Z95818 Presence of other cardiac implants and grafts: Secondary | ICD-10-CM | POA: Diagnosis not present

## 2024-10-12 DIAGNOSIS — I340 Nonrheumatic mitral (valve) insufficiency: Secondary | ICD-10-CM | POA: Diagnosis not present

## 2024-10-12 DIAGNOSIS — Z7901 Long term (current) use of anticoagulants: Secondary | ICD-10-CM | POA: Diagnosis not present

## 2024-10-12 DIAGNOSIS — I119 Hypertensive heart disease without heart failure: Secondary | ICD-10-CM | POA: Diagnosis not present

## 2024-10-12 MED ORDER — CLOPIDOGREL BISULFATE 75 MG PO TABS: 75.0000 mg | ORAL_TABLET | Freq: Every day | ORAL | 3 refills | Status: AC

## 2024-10-12 NOTE — Progress Notes (Signed)
  Electrophysiology Office Note:   Date:  10/12/2024  ID:  STEFANO TRULSON, DOB 31-Oct-1940, MRN [id number]  Primary Cardiologist: Stanly DELENA Leavens, MD Electrophysiologist: OLE ONEIDA HOLTS, MD      History of Present Illness:   Daniel Love is a 84 y.o. male with h/o permanent AF, CKD IV, and HFpEF seen today for routine electrophysiology follow-up s/p Watchman implantation.  Since last being seen in our clinic the patient reports doing very well. Overall, he denies chest pain, palpitations, dyspnea, PND, orthopnea, nausea, vomiting, dizziness, syncope, edema, weight gain, or early satiety.    Review of systems complete and found to be negative unless listed in HPI.   EP Information / Studies Reviewed:    EKG is ordered today. Personal review as below.  EKG Interpretation Date/Time:  Monday October 12 2024 11:02:21 EDT Ventricular Rate:  62 PR Interval:    QRS Duration:  126 QT Interval:  448 QTC Calculation: 454 R Axis:   -29  Text Interpretation: Atrial fibrillation with premature ventricular or aberrantly conducted complexes Right bundle branch block When compared with ECG of 17-Aug-2024 10:42, Atrial fibrillation has replaced Wide QRS rhythm Confirmed by Lesia Sharper [id number] on 10/12/2024 11:08:24 AM    Arrhythmia/Device History No specialty comments available.   Physical Exam:   VS:  BP 100/60 (BP Location: Right Arm, Patient Position: Sitting, Cuff Size: Normal)   Pulse 61   Ht 5' 10 (1.778 m)   Wt 176 lb (79.8 kg)   SpO2 99%   BMI 25.25 kg/m    Wt Readings from Last 3 Encounters:  10/12/24 176 lb (79.8 kg)  09/03/24 176 lb 12.8 oz (80.2 kg)  08/27/24 173 lb 11.6 oz (78.8 kg)     GEN: No acute distress NECK: No JVD; No carotid bruits CARDIAC: Irregularly irregular rate and rhythm, no murmurs, rubs, gallops RESPIRATORY:  Clear to auscultation without rales, wheezing or rhonchi  ABDOMEN: Soft, non-tender, non-distended EXTREMITIES:  No edema; No  deformity   ASSESSMENT AND PLAN:    Permanent Atrial fibrillation S/p Watchman 08/27/2024 TEE planned for 09/2024 with Dr. Burton On 10/14 transition from Eliquis  to Plavix 75 mg daily.  Will need prophylactic antibiotics for dental procedures. Plans to just hold off until after 6 month mark.  CKD IV TEE instead of CT.  Labs and instructions today.  HFpEF Volume status OK  HTN Stable on current regimen   Symptomatic anemia GI bleeding OAC plan as above.   Follow up with EP Team in April for 6 month post watchman visit.  Signed, Sharper Prentice Lesia, PA-C

## 2024-10-12 NOTE — H&P (View-Only) (Signed)
  Electrophysiology Office Note:   Date:  10/12/2024  ID:  Daniel Love, DOB 31-Oct-1940, MRN [id number]  Primary Cardiologist: Stanly DELENA Leavens, MD Electrophysiologist: OLE ONEIDA HOLTS, MD      History of Present Illness:   Daniel Love is a 84 y.o. male with h/o permanent AF, CKD IV, and HFpEF seen today for routine electrophysiology follow-up s/p Watchman implantation.  Since last being seen in our clinic the patient reports doing very well. Overall, he denies chest pain, palpitations, dyspnea, PND, orthopnea, nausea, vomiting, dizziness, syncope, edema, weight gain, or early satiety.    Review of systems complete and found to be negative unless listed in HPI.   EP Information / Studies Reviewed:    EKG is ordered today. Personal review as below.  EKG Interpretation Date/Time:  Monday October 12 2024 11:02:21 EDT Ventricular Rate:  62 PR Interval:    QRS Duration:  126 QT Interval:  448 QTC Calculation: 454 R Axis:   -29  Text Interpretation: Atrial fibrillation with premature ventricular or aberrantly conducted complexes Right bundle branch block When compared with ECG of 17-Aug-2024 10:42, Atrial fibrillation has replaced Wide QRS rhythm Confirmed by Lesia Sharper [id number] on 10/12/2024 11:08:24 AM    Arrhythmia/Device History No specialty comments available.   Physical Exam:   VS:  BP 100/60 (BP Location: Right Arm, Patient Position: Sitting, Cuff Size: Normal)   Pulse 61   Ht 5' 10 (1.778 m)   Wt 176 lb (79.8 kg)   SpO2 99%   BMI 25.25 kg/m    Wt Readings from Last 3 Encounters:  10/12/24 176 lb (79.8 kg)  09/03/24 176 lb 12.8 oz (80.2 kg)  08/27/24 173 lb 11.6 oz (78.8 kg)     GEN: No acute distress NECK: No JVD; No carotid bruits CARDIAC: Irregularly irregular rate and rhythm, no murmurs, rubs, gallops RESPIRATORY:  Clear to auscultation without rales, wheezing or rhonchi  ABDOMEN: Soft, non-tender, non-distended EXTREMITIES:  No edema; No  deformity   ASSESSMENT AND PLAN:    Permanent Atrial fibrillation S/p Watchman 08/27/2024 TEE planned for 09/2024 with Dr. Burton On 10/14 transition from Eliquis  to Plavix 75 mg daily.  Will need prophylactic antibiotics for dental procedures. Plans to just hold off until after 6 month mark.  CKD IV TEE instead of CT.  Labs and instructions today.  HFpEF Volume status OK  HTN Stable on current regimen   Symptomatic anemia GI bleeding OAC plan as above.   Follow up with EP Team in April for 6 month post watchman visit.  Signed, Sharper Prentice Lesia, PA-C

## 2024-10-12 NOTE — Patient Instructions (Signed)
 Medication Instructions:  STOP Eliquis  afte evening dose on 10\13\2025  START Clopidogrel (Plavix) 75 mg by mouth daily on 10\14\2025 *If you need a refill on your cardiac medications before your next appointment, please call your pharmacy*  Lab Work: BMET, CBC If you have labs (blood work) drawn today and your tests are completely normal, you will receive your results only by: MyChart Message (if you have MyChart) OR A paper copy in the mail If you have any lab test that is abnormal or we need to change your treatment, we will call you to review the results.  Testing/Procedures: Keep appointment for TEE on 10\20\2025 See Letter  Follow-Up: At [facility], you and your health needs are our priority.  As part of our continuing mission to provide you with exceptional heart care, our providers are all part of one team.  This team includes your primary Cardiologist (physician) and Advanced Practice Providers or APPs (Physician Assistants and Nurse Practitioners) who all work together to provide you with the care you need, when you need it.  Your next appointment:    AFTER 4\28\2026  Provider:   Ozell Jodie Passey, PA-C    We recommend signing up for the patient portal called MyChart.  Sign up information is provided on this After Visit Summary.  MyChart is used to connect with patients for Virtual Visits (Telemedicine).  Patients are able to view lab/test results, encounter notes, upcoming appointments, etc.  Non-urgent messages can be sent to your provider as well.   To learn more about what you can do with MyChart, go to [email or web address].

## 2024-10-13 ENCOUNTER — Ambulatory Visit: Payer: Self-pay | Admitting: Student

## 2024-10-13 LAB — CBC
Hematocrit: 33.1 % — ABNORMAL LOW (ref 37.5–51.0)
Hemoglobin: 10.3 g/dL — ABNORMAL LOW (ref 13.0–17.7)
MCH: 29.5 pg (ref 26.6–33.0)
MCHC: 31.1 g/dL — ABNORMAL LOW (ref 31.5–35.7)
MCV: 95 fL (ref 79–97)
Platelets: 186 x10E3/uL (ref 150–450)
RBC: 3.49 x10E6/uL — ABNORMAL LOW (ref 4.14–5.80)
RDW: 13.9 % (ref 11.6–15.4)
WBC: 6.6 x10E3/uL (ref 3.4–10.8)

## 2024-10-13 LAB — BASIC METABOLIC PANEL WITH GFR
BUN/Creatinine Ratio: 28 — ABNORMAL HIGH (ref 10–24)
BUN: 62 mg/dL — ABNORMAL HIGH (ref 8–27)
CO2: 23 mmol/L (ref 20–29)
Calcium: 9.2 mg/dL (ref 8.6–10.2)
Chloride: 105 mmol/L (ref 96–106)
Creatinine, Ser: 2.18 mg/dL — ABNORMAL HIGH (ref 0.76–1.27)
Glucose: 84 mg/dL (ref 70–99)
Potassium: 4.4 mmol/L (ref 3.5–5.2)
Sodium: 144 mmol/L (ref 134–144)
eGFR: 29 mL/min/1.73 — ABNORMAL LOW (ref >59)

## 2024-10-16 NOTE — Progress Notes (Signed)
 Spoke to patient and instructed them to come at 1000  and to be NPO after 0000.     Confirmed that patient will have a ride home and someone to stay with them for 24 hours after the procedure.   Medications reviewed.  Confirmed blood thinner.  Confirmed no breaks in taking blood thinner for 3+ weeks prior to procedure. Confirmed patient stopped all GLP-1s and GLP-2s for at least one week before procedure.

## 2024-10-19 ENCOUNTER — Encounter (HOSPITAL_COMMUNITY)
Admission: RE | Disposition: A | Discharge status: Home / Self Care | Payer: Self-pay | Source: Home / Self Care | Attending: Cardiovascular Disease

## 2024-10-19 ENCOUNTER — Ambulatory Visit (HOSPITAL_COMMUNITY): Admitting: Anesthesiology

## 2024-10-19 ENCOUNTER — Ambulatory Visit (HOSPITAL_COMMUNITY)

## 2024-10-19 ENCOUNTER — Ambulatory Visit (HOSPITAL_COMMUNITY)
Admission: RE | Admit: 2024-10-19 | Discharge: 2024-10-19 | Disposition: A | Discharge status: Home / Self Care | Attending: Cardiovascular Disease | Admitting: Cardiovascular Disease

## 2024-10-19 ENCOUNTER — Encounter (HOSPITAL_COMMUNITY): Payer: Self-pay | Admitting: Cardiovascular Disease

## 2024-10-19 ENCOUNTER — Other Ambulatory Visit: Payer: Self-pay

## 2024-10-19 DIAGNOSIS — N184 Chronic kidney disease, stage 4 (severe): Secondary | ICD-10-CM | POA: Insufficient documentation

## 2024-10-19 DIAGNOSIS — I130 Hypertensive heart and chronic kidney disease with heart failure and stage 1 through stage 4 chronic kidney disease, or unspecified chronic kidney disease: Secondary | ICD-10-CM | POA: Insufficient documentation

## 2024-10-19 DIAGNOSIS — Z95818 Presence of other cardiac implants and grafts: Secondary | ICD-10-CM | POA: Diagnosis not present

## 2024-10-19 DIAGNOSIS — I77810 Thoracic aortic ectasia: Secondary | ICD-10-CM | POA: Insufficient documentation

## 2024-10-19 DIAGNOSIS — I4821 Permanent atrial fibrillation: Secondary | ICD-10-CM

## 2024-10-19 DIAGNOSIS — D649 Anemia, unspecified: Secondary | ICD-10-CM | POA: Insufficient documentation

## 2024-10-19 DIAGNOSIS — I5032 Chronic diastolic (congestive) heart failure: Secondary | ICD-10-CM | POA: Diagnosis not present

## 2024-10-19 DIAGNOSIS — Z7902 Long term (current) use of antithrombotics/antiplatelets: Secondary | ICD-10-CM | POA: Diagnosis not present

## 2024-10-19 DIAGNOSIS — I110 Hypertensive heart disease with heart failure: Secondary | ICD-10-CM | POA: Diagnosis not present

## 2024-10-19 DIAGNOSIS — I083 Combined rheumatic disorders of mitral, aortic and tricuspid valves: Secondary | ICD-10-CM | POA: Insufficient documentation

## 2024-10-19 DIAGNOSIS — I340 Nonrheumatic mitral (valve) insufficiency: Secondary | ICD-10-CM | POA: Diagnosis not present

## 2024-10-19 DIAGNOSIS — I4891 Unspecified atrial fibrillation: Secondary | ICD-10-CM

## 2024-10-19 HISTORY — PX: TRANSESOPHAGEAL ECHOCARDIOGRAM (CATH LAB): EP1270

## 2024-10-19 LAB — ECHO TEE

## 2024-10-19 SURGERY — TRANSESOPHAGEAL ECHOCARDIOGRAM (TEE) (CATHLAB)
Anesthesia: Monitor Anesthesia Care

## 2024-10-19 MED ORDER — PROPOFOL 500 MG/50ML IV EMUL
INTRAVENOUS | Status: DC | PRN
  Administered 2024-10-19: 125 ug/kg/min via INTRAVENOUS

## 2024-10-19 MED ORDER — PROPOFOL 10 MG/ML IV BOLUS
INTRAVENOUS | Status: DC | PRN
  Administered 2024-10-19: 60 mg via INTRAVENOUS

## 2024-10-19 MED ORDER — LIDOCAINE 2% (20 MG/ML) 5 ML SYRINGE
INTRAMUSCULAR | Status: DC | PRN
  Administered 2024-10-19: 100 mg via INTRAVENOUS

## 2024-10-19 MED ORDER — SODIUM CHLORIDE 0.9 % IV SOLN: INTRAVENOUS | Status: DC

## 2024-10-19 MED ORDER — EPHEDRINE SULFATE-NACL 50-0.9 MG/10ML-% IV SOSY
PREFILLED_SYRINGE | INTRAVENOUS | Status: DC | PRN
  Administered 2024-10-19: 10 mg via INTRAVENOUS
  Administered 2024-10-19: 5 mg via INTRAVENOUS
  Administered 2024-10-19: 10 mg via INTRAVENOUS

## 2024-10-19 MED ORDER — PHENYLEPHRINE 80 MCG/ML (10ML) SYRINGE FOR IV PUSH (FOR BLOOD PRESSURE SUPPORT)
PREFILLED_SYRINGE | INTRAVENOUS | Status: DC | PRN
  Administered 2024-10-19: 80 ug via INTRAVENOUS
  Administered 2024-10-19 (×4): 160 ug via INTRAVENOUS

## 2024-10-19 NOTE — Discharge Instructions (Signed)
 TEE   YOU HAD AN CARDIAC PROCEDURE TODAY: Refer to the procedure report and other information in the discharge instructions given to you for any specific questions about what was found during the examination.   DIET: Your first meal following the procedure should be a light meal and then it is ok to progress to your normal diet. A half-sandwich or bowl of soup is an example of a good first meal. Heavy or fried foods are harder to digest and may make you feel nauseous or bloated. Drink plenty of fluids but you should avoid alcoholic beverages for 24 hours. If you had a esophageal dilation, please see attached instructions for diet.   ACTIVITY: Your care partner should take you home directly after the procedure. You should plan to take it easy, moving slowly for the rest of the day. You can resume normal activity the day after the procedure however YOU SHOULD NOT DRIVE, use power tools, machinery or perform tasks that involve climbing or major physical exertion for 24 hours (because of the sedation medicines used during the test).   SYMPTOMS TO REPORT IMMEDIATELY: Please call your cardiologist for any of the following symptoms:  Vomiting of blood or coffee ground material  New, significant abdominal pain  New, significant chest pain or pain under the shoulder blades  Painful or persistently difficult swallowing  New shortness of breath  Black, tarry-looking or red, bloody stools  FOLLOW UP:  Please also call with any specific questions about appointments or follow up tests.

## 2024-10-19 NOTE — Anesthesia Preprocedure Evaluation (Signed)
 Anesthesia Evaluation  Patient identified by MRN, date of birth, ID band Patient awake    Reviewed: Allergy & Precautions, NPO status , Patient's Chart, lab work & pertinent test results  Airway Mallampati: II  TM Distance: >3 FB Neck ROM: Full    Dental no notable dental hx.    Pulmonary neg pulmonary ROS, former smoker   Pulmonary exam normal        Cardiovascular hypertension, Pt. on medications + dysrhythmias Atrial Fibrillation  Rhythm:Irregular Rate:Normal  ECHO 2025: IMPRESSIONS   1. Chicken Wing appendage with no thrombus. Well placed 24 mm Watchman FLX device. No leak by color flow. Negative tug test Average compression 24%.  2. Left ventricular ejection fraction, by estimation, is 55%. The left ventricle has normal function.  3. Right ventricular systolic function is mildly reduced. The right ventricular size is mildly enlarged.  4. Left atrial size was severely dilated. No left atrial/left atrial appendage thrombus was detected.  5. Right atrial size was severely dilated.  6. Moderate MR with ERO 0.25 cm2 and RV 41 cc. P3 prolapse ? cleft between P2/P3 MVA 8.8 cm2 with peak diastoic gradient 2 mmHg . The mitral valve is abnormal. Moderate mitral valve regurgitation.  7. Tricuspid valve regurgitation is moderate.  8. The aortic valve is tricuspid. There is mild calcification of the aortic valve. There is mild thickening of the aortic valve. Aortic valve regurgitation is mild to moderate. Aortic valve sclerosis/calcification is present, without any evidence of aortic stenosis.  9. 3D performed of the mitral valve and 3D performed of the tricuspid valve and demonstrates 3D of MV and TV performed. 10. Post trans septal puncture only left to right shunting.    Neuro/Psych negative neurological ROS  negative psych ROS   GI/Hepatic Neg liver ROS,GERD  ,,  Endo/Other    Renal/GU negative Renal ROS  negative  genitourinary   Musculoskeletal  (+) Arthritis , Osteoarthritis,    Abdominal Normal abdominal exam  (+)   Peds  Hematology negative hematology ROS (+)   Anesthesia Other Findings   Reproductive/Obstetrics                              Anesthesia Physical Anesthesia Plan  ASA: 3  Anesthesia Plan: MAC   Post-op Pain Management:    Induction: Intravenous  PONV Risk Score and Plan: 1 and Propofol  infusion and Treatment may vary due to age or medical condition  Airway Management Planned: Simple Face Mask and Nasal Cannula  Additional Equipment: None  Intra-op Plan:   Post-operative Plan:   Informed Consent: I have reviewed the patients History and Physical, chart, labs and discussed the procedure including the risks, benefits and alternatives for the proposed anesthesia with the patient or authorized representative who has indicated his/her understanding and acceptance.     Dental advisory given  Plan Discussed with: CRNA  Anesthesia Plan Comments:         Anesthesia Quick Evaluation

## 2024-10-19 NOTE — CV Procedure (Signed)
    TRANSESOPHAGEAL ECHOCARDIOGRAM   NAME:  Daniel Love    MRN: [id number] DOB:  15-Apr-1940    ADMIT DATE: 10/19/2024  INDICATIONS: S/p Watchman  PROCEDURE:   Informed consent was obtained prior to the procedure. The risks, benefits and alternatives for the procedure were discussed and the patient comprehended these risks.  Risks include, but are not limited to, cough, sore throat, vomiting, nausea, somnolence, esophageal and stomach trauma or perforation, bleeding, low blood pressure, aspiration, pneumonia, infection, trauma to the teeth and death.    Procedural time out performed. The oropharynx was anesthetized with topical 1% benzocaine .    Anesthesia was administered by Dr. Alveria.  The patient was administered 200 mg of propofol  and 100 mg of lidocaine  to achieve and maintain moderate conscious sedation.  The patient's heart rate, blood pressure, and oxygen saturation are monitored continuously during the procedure. The period of conscious sedation is 17 minutes, of which I was present face-to-face 100% of this time.   The transesophageal probe was inserted in the esophagus and stomach without difficulty and multiple views were obtained.   COMPLICATIONS:    There were no immediate complications.  KEY FINDINGS:  S/p Watchman with successful closure. No device leak or device related thrombus.  Severe MR Severe TR.  Residual iatrogenic ASD with R to L shunting. LVEF 60-65%.  Severely dilated RV with moderately reduced RV function.  Full report to follow. Further management per primary team.   Signed, Darryle DASEN. Barbaraann, MD, [facility]  [facility]  [address] [place], [place] [address] [phone]  10:59 AM

## 2024-10-19 NOTE — Transfer of Care (Signed)
 Immediate Anesthesia Transfer of Care Note  Patient: Daniel Love  Procedure(s) Performed: TRANSESOPHAGEAL ECHOCARDIOGRAM  Patient Location: PACU  Anesthesia Type:MAC  Level of Consciousness: awake and drowsy  Airway & Oxygen Therapy: Patient Spontanous Breathing  Post-op Assessment: Report given to RN, Post -op Vital signs reviewed and stable, and Patient moving all extremities X 4  Post vital signs: Reviewed and stable  Last Vitals:  Vitals Value Taken Time  BP    Temp    Pulse 71 10/19/24 11:04  Resp 17 10/19/24 11:04  SpO2 97 % 10/19/24 11:04  Vitals shown include unfiled device data.  Last Pain:  Vitals:   10/19/24 0945  TempSrc: Temporal         Complications: No notable events documented.

## 2024-10-19 NOTE — Anesthesia Postprocedure Evaluation (Signed)
 Anesthesia Post Note  Patient: Daniel Love  Procedure(s) Performed: TRANSESOPHAGEAL ECHOCARDIOGRAM     Patient location during evaluation: PACU Anesthesia Type: MAC Level of consciousness: awake and alert Pain management: pain level controlled Vital Signs Assessment: post-procedure vital signs reviewed and stable Respiratory status: spontaneous breathing, nonlabored ventilation, respiratory function stable and patient connected to nasal cannula oxygen Cardiovascular status: stable and blood pressure returned to baseline Postop Assessment: no apparent nausea or vomiting Anesthetic complications: no   No notable events documented.  Last Vitals:  Vitals:   10/19/24 1135 10/19/24 1140  BP: 97/61 97/62  Pulse: 64 65  Resp: 11 (!) 7  Temp:    SpO2: 100% 99%    Last Pain:  Vitals:   10/19/24 1105  TempSrc: Temporal  PainSc: 0-No pain                 Cordella P Jull Harral

## 2024-10-19 NOTE — Interval H&P Note (Signed)
 History and Physical Interval Note:  10/19/2024 10:25 AM  [name]  has presented today for surgery, with the diagnosis of AFIB,WATCHMAN EVALUATION.  The various methods of treatment have been discussed with the patient and family. After consideration of risks, benefits and other options for treatment, the patient has consented to  Procedure(s): TRANSESOPHAGEAL ECHOCARDIOGRAM (N/A) as a surgical intervention.  The patient's history has been reviewed, patient examined, no change in status, stable for surgery.  I have reviewed the patient's chart and labs.  Questions were answered to the patient's satisfaction.     NPO for TEE for Watchman post procedure.   Signed, Darryle DASEN. Barbaraann, MD, [facility]  [facility]  [address] [place], [place] [address] [phone]  10:25 AM

## 2024-10-22 DIAGNOSIS — N184 Chronic kidney disease, stage 4 (severe): Secondary | ICD-10-CM | POA: Diagnosis not present

## 2024-10-22 DIAGNOSIS — C61 Malignant neoplasm of prostate: Secondary | ICD-10-CM | POA: Diagnosis not present

## 2024-10-23 DIAGNOSIS — D4102 Neoplasm of uncertain behavior of left kidney: Secondary | ICD-10-CM | POA: Diagnosis not present

## 2024-10-23 DIAGNOSIS — D49512 Neoplasm of unspecified behavior of left kidney: Secondary | ICD-10-CM | POA: Diagnosis not present

## 2024-10-26 ENCOUNTER — Ambulatory Visit: Payer: Self-pay | Admitting: Cardiology

## 2024-10-26 NOTE — Telephone Encounter (Signed)
 Spoke with patient. Reviewed results.  He verbalized understanding.   Arranged appointment with Dr. Santo 12/04/2024.  He was grateful for the phone call.

## 2024-10-27 DIAGNOSIS — D500 Iron deficiency anemia secondary to blood loss (chronic): Secondary | ICD-10-CM | POA: Diagnosis not present

## 2024-10-29 ENCOUNTER — Other Ambulatory Visit: Payer: Self-pay | Admitting: *Deleted

## 2024-11-03 DIAGNOSIS — D500 Iron deficiency anemia secondary to blood loss (chronic): Secondary | ICD-10-CM | POA: Diagnosis not present

## 2024-11-10 DIAGNOSIS — D500 Iron deficiency anemia secondary to blood loss (chronic): Secondary | ICD-10-CM | POA: Diagnosis not present

## 2024-11-10 DIAGNOSIS — D631 Anemia in chronic kidney disease: Secondary | ICD-10-CM | POA: Diagnosis not present

## 2024-11-10 DIAGNOSIS — I4821 Permanent atrial fibrillation: Secondary | ICD-10-CM | POA: Diagnosis not present

## 2024-11-10 DIAGNOSIS — N184 Chronic kidney disease, stage 4 (severe): Secondary | ICD-10-CM | POA: Diagnosis not present

## 2024-11-11 DIAGNOSIS — Z1329 Encounter for screening for other suspected endocrine disorder: Secondary | ICD-10-CM | POA: Diagnosis not present

## 2024-11-11 DIAGNOSIS — C61 Malignant neoplasm of prostate: Secondary | ICD-10-CM | POA: Diagnosis not present

## 2024-11-11 DIAGNOSIS — N184 Chronic kidney disease, stage 4 (severe): Secondary | ICD-10-CM | POA: Diagnosis not present

## 2024-11-11 DIAGNOSIS — E7800 Pure hypercholesterolemia, unspecified: Secondary | ICD-10-CM | POA: Diagnosis not present

## 2024-11-12 DIAGNOSIS — I10 Essential (primary) hypertension: Secondary | ICD-10-CM | POA: Diagnosis not present

## 2024-11-12 DIAGNOSIS — Z0000 Encounter for general adult medical examination without abnormal findings: Secondary | ICD-10-CM | POA: Diagnosis not present

## 2024-11-19 DIAGNOSIS — M1A9XX1 Chronic gout, unspecified, with tophus (tophi): Secondary | ICD-10-CM | POA: Diagnosis not present

## 2024-11-19 DIAGNOSIS — R609 Edema, unspecified: Secondary | ICD-10-CM | POA: Diagnosis not present

## 2024-11-19 DIAGNOSIS — I5030 Unspecified diastolic (congestive) heart failure: Secondary | ICD-10-CM | POA: Diagnosis not present

## 2024-11-19 DIAGNOSIS — I4891 Unspecified atrial fibrillation: Secondary | ICD-10-CM | POA: Diagnosis not present

## 2024-11-19 DIAGNOSIS — I50810 Right heart failure, unspecified: Secondary | ICD-10-CM | POA: Diagnosis not present

## 2024-11-19 DIAGNOSIS — D631 Anemia in chronic kidney disease: Secondary | ICD-10-CM | POA: Diagnosis not present

## 2024-11-19 DIAGNOSIS — N184 Chronic kidney disease, stage 4 (severe): Secondary | ICD-10-CM | POA: Diagnosis not present

## 2024-11-25 DIAGNOSIS — D485 Neoplasm of uncertain behavior of skin: Secondary | ICD-10-CM | POA: Diagnosis not present

## 2024-11-25 DIAGNOSIS — L578 Other skin changes due to chronic exposure to nonionizing radiation: Secondary | ICD-10-CM | POA: Diagnosis not present

## 2024-11-25 DIAGNOSIS — L738 Other specified follicular disorders: Secondary | ICD-10-CM | POA: Diagnosis not present

## 2024-11-25 DIAGNOSIS — L570 Actinic keratosis: Secondary | ICD-10-CM | POA: Diagnosis not present

## 2024-11-25 DIAGNOSIS — Z85828 Personal history of other malignant neoplasm of skin: Secondary | ICD-10-CM | POA: Diagnosis not present

## 2024-11-30 ENCOUNTER — Other Ambulatory Visit: Payer: Self-pay | Admitting: Internal Medicine

## 2024-12-01 DIAGNOSIS — D631 Anemia in chronic kidney disease: Secondary | ICD-10-CM | POA: Diagnosis not present

## 2024-12-01 DIAGNOSIS — N184 Chronic kidney disease, stage 4 (severe): Secondary | ICD-10-CM | POA: Diagnosis not present

## 2024-12-04 ENCOUNTER — Ambulatory Visit: Attending: Internal Medicine | Admitting: Internal Medicine

## 2024-12-04 VITALS — BP 117/76 | HR 64 | Ht 70.0 in | Wt 174.0 lb

## 2024-12-04 DIAGNOSIS — I361 Nonrheumatic tricuspid (valve) insufficiency: Secondary | ICD-10-CM | POA: Diagnosis present

## 2024-12-04 DIAGNOSIS — I351 Nonrheumatic aortic (valve) insufficiency: Secondary | ICD-10-CM | POA: Diagnosis present

## 2024-12-04 DIAGNOSIS — I080 Rheumatic disorders of both mitral and aortic valves: Secondary | ICD-10-CM | POA: Diagnosis present

## 2024-12-04 NOTE — Patient Instructions (Signed)
 Medication Instructions:  Your physician recommends that you continue on your current medications as directed. Please refer to the Current Medication list given to you today.  *If you need a refill on your cardiac medications before your next appointment, please call your pharmacy*  Lab Work: None ordered.  If you have labs (blood work) drawn today and your tests are completely normal, you will receive your results only by: MyChart Message (if you have MyChart) OR A paper copy in the mail If you have any lab test that is abnormal or we need to change your treatment, we will call you to review the results.  Testing/Procedures: Please schedule for 6 months Your physician has requested that you have an echocardiogram. Echocardiography is a painless test that uses sound waves to create images of your heart. It provides your doctor with information about the size and shape of your heart and how well your heart's chambers and valves are working. This procedure takes approximately one hour. There are no restrictions for this procedure. Please do NOT wear cologne, perfume, aftershave, or lotions (deodorant is allowed). Please arrive 15 minutes prior to your appointment time.  Please note: We ask at that you not bring children with you during ultrasound (echo/ vascular) testing. Due to room size and safety concerns, children are not allowed in the ultrasound rooms during exams. Our front office staff cannot provide observation of children in our lobby area while testing is being conducted. An adult accompanying a patient to their appointment will only be allowed in the ultrasound room at the discretion of the ultrasound technician under special circumstances. We apologize for any inconvenience.   Follow-Up: At [facility], you and your health needs are our priority.  As part of our continuing mission to provide you with exceptional heart care, our providers are all part of one team.  This team  includes your primary Cardiologist (physician) and Advanced Practice Providers or APPs (Physician Assistants and Nurse Practitioners) who all work together to provide you with the care you need, when you need it.  Your next appointment:   7 months with Dr Santo

## 2024-12-04 NOTE — Progress Notes (Signed)
 Cardiology Office Note:  .    Date:  12/04/2024  ID:  Daniel Love, DOB 08/08/1940, MRN [id number] PCP: Chinita Hoy LITTIE DEVONNA  [facility] Providers Cardiologist:  Stanly DELENA Leavens, MD Electrophysiologist:  OLE ONEIDA HOLTS, MD     CC: Follow up valve disease  History of Present Illness: .    Daniel Love is a 84 y.o. male with AF and frequent GI bleeds with CKD stage IV and with severe mitral and tricuspid regurgitation who presents for follow-up of his heart valve conditions. He is accompanied by his daughter, Daniel Love.  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 He underwent a Watchman procedure, with follow-up imaging confirming proper placement around October 28th. Currently, he feels well with no chest pain.  He has a history of low iron levels and anemia, which have been successfully treated, leading to improved hemoglobin levels. He notes that his current hemoglobin levels are good, and he feels better overall.  No significant leg swelling, stating it is much improved compared to the past. No breathing difficulties, and he feels that his legs are in the best condition they have been in a long time.  He has been managing his conditions for several years, with a history of anemia.   Relevant histories: .  2022: moderate MR, Aortic dilation seen by Dr. Lucas 2023: pre-op for renal mass cryoablation. Still had moderate to severe MR 2024: Had Stable echo: mod to severe AI , Mod severe TR, and Mod severe MR   Social comes with daughter since 2023, married with wife who now has dementia, she passed in 2024 2025: Now s/p Watchman; [place] or Daniel Love come with him for visits. ROS: As per HPI.   Studies Reviewed: .   Cardiac Studies & Procedures   ______________________________________________________________________________________________   STRESS TESTS  MYOCARDIAL PERFUSION IMAGING 02/17/2018  Interpretation  Summary  Nuclear stress EF: 42%.  There was no ST segment deviation noted during stress.  This is an intermediate risk study.  The left ventricular ejection fraction is moderately decreased (30-44%).  1. EF 42%, diffuse hypokinesis. 2. Fixed small, mild apical lateral perfusion defect.  Possible prior infarction, no evidence for significant ischemia.  Intermediate risk study primarily due to low EF.   ECHOCARDIOGRAM  ECHOCARDIOGRAM COMPLETE 04/03/2023  Narrative ECHOCARDIOGRAM REPORT    Patient Name:   Daniel Love Date of Exam: 04/03/2023 Medical Rec #:  [id number]        Height:       71.0 in Accession #:    [id number]       Weight:       187.6 lb Date of Birth:  [birth year]       BSA:          2.052 m Patient Age:    82 years         BP:           110/70 mmHg Patient Gender: M                HR:           81 bpm. Exam Location:  [address]  Procedure: 2D Echo, 3D Echo, Cardiac Doppler, Color Doppler and Strain Analysis  Indications:     I50.9 CHF  History:         Patient has prior history of Echocardiogram examinations, most recent 11/13/2022. CHF, Abnormal ECG, Arrythmias:Atrial Fibrillation; Risk Factors:Family History of Coronary Artery  Disease, Hypertension and Former Smoker. Aortic Root and Ascending Aneurysm.  Sonographer:     Heather Hawks RDCS Referring Phys:  GLENDIA DASEN WEAVER Diagnosing Phys: Soyla Merck MD  IMPRESSIONS   1. Left ventricular ejection fraction, by estimation, is 55 to 60%. Left ventricular ejection fraction by 3D volume is 61 %. The left ventricle has normal function. The left ventricle has no regional wall motion abnormalities. There is mild left ventricular hypertrophy. Left ventricular diastolic parameters are indeterminate. 2. Right ventricular systolic function is mildly reduced. The right ventricular size is moderately enlarged. There is mildly elevated pulmonary artery systolic pressure. The estimated right ventricular  systolic pressure is 44.2 mmHg. 3. Left atrial size was severely dilated. 4. Right atrial size was severely dilated. 5. Posterior mitral leaflet prolapse with probable small flail segment. Moderate-severe MR with splay artifact in short axis suggesting possible underestimation. MR appeared severe on study from 11/13/22. The mitral valve is abnormal. Moderate to severe mitral valve regurgitation. No evidence of mitral stenosis. 6. Tricuspid valve regurgitation is severe. 7. The aortic valve is tricuspid. There is mild thickening of the aortic valve. Aortic valve regurgitation is moderate with a possible eccentric component from apical long axis view. Descending aorta diastolic reversal TVI 9 cm. No aortic stenosis is present. 8. Aortic dilatation noted. Aneurysm of the ascending aorta, measuring 45 mm. There is borderline dilatation of the aortic root, measuring 42 mm. There is moderate dilatation of the ascending aorta. 9. The inferior vena cava is dilated in size with <50% respiratory variability, suggesting right atrial pressure of 15 mmHg.  Comparison(s): Consider TEE to further evaluate mitral valve if clinically indicated.  FINDINGS Left Ventricle: Left ventricular ejection fraction, by estimation, is 55 to 60%. Left ventricular ejection fraction by 3D volume is 61 %. The left ventricle has normal function. The left ventricle has no regional wall motion abnormalities. Global longitudinal strain performed but not reported based on interpreter judgement due to suboptimal tracking. The left ventricular internal cavity size was normal in size. There is mild left ventricular hypertrophy. Left ventricular diastolic function could not be evaluated due to mitral regurgitation (moderate or greater). Left ventricular diastolic parameters are indeterminate.  Right Ventricle: The right ventricular size is moderately enlarged. No increase in right ventricular wall thickness. Right ventricular systolic  function is mildly reduced. There is mildly elevated pulmonary artery systolic pressure. The tricuspid regurgitant velocity is 2.70 m/s, and with an assumed right atrial pressure of 15 mmHg, the estimated right ventricular systolic pressure is 44.2 mmHg.  Left Atrium: Left atrial size was severely dilated.  Right Atrium: Right atrial size was severely dilated.  Pericardium: There is no evidence of pericardial effusion.  Mitral Valve: Posterior mitral leaflet prolapse with probable small flail segment. Moderate-severe MR with splay artifact in short axis suggesting possible underestimation. MR appeared severe on study from 11/13/22. The mitral valve is abnormal. Moderate to severe mitral valve regurgitation. No evidence of mitral valve stenosis.  Tricuspid Valve: The tricuspid valve is normal in structure. Tricuspid valve regurgitation is severe. No evidence of tricuspid stenosis.  Aortic Valve: The aortic valve is tricuspid. There is mild thickening of the aortic valve. Aortic valve regurgitation is moderate. Aortic regurgitation PHT measures 545 msec. No aortic stenosis is present.  Pulmonic Valve: The pulmonic valve was normal in structure. Pulmonic valve regurgitation is trivial. No evidence of pulmonic stenosis.  Aorta: Aortic dilatation noted. There is borderline dilatation of the aortic root, measuring 42 mm. There is moderate dilatation of  the ascending aorta. There is an aneurysm involving the ascending aorta measuring 45 mm.  Venous: The inferior vena cava is dilated in size with less than 50% respiratory variability, suggesting right atrial pressure of 15 mmHg.  IAS/Shunts: No atrial level shunt detected by color flow Doppler.   LEFT VENTRICLE PLAX 2D LVIDd:         4.50 cm         Diastology LVIDs:         3.00 cm         LV e' medial:    6.09 cm/s LV PW:         1.10 cm         LV E/e' medial:  11.9 LV IVS:        1.10 cm         LV e' lateral:   13.10 cm/s LVOT diam:      2.40 cm         LV E/e' lateral: 5.5 LV SV:         55 LV SV Index:   27              2D LVOT Area:     4.52 cm        Longitudinal Strain 2D Strain GLS  -15.4 % (A2C): 2D Strain GLS  -14.8 % (A3C): 2D Strain GLS  -19.9 % (A4C): 2D Strain GLS  -16.7 % Avg:  3D Volume EF LV 3D EF:    Left ventricul ar ejection fraction by 3D volume is 61 %.  3D Volume EF: 3D EF:        61 % LV EDV:       133 ml LV ESV:       52 ml LV SV:        81 ml  RIGHT VENTRICLE RV Basal diam:  4.70 cm RV Mid diam:    3.90 cm RV S prime:     10.60 cm/s TAPSE (M-mode): 1.5 cm RVSP:           32.2 mmHg  LEFT ATRIUM              Index        RIGHT ATRIUM           Index LA diam:        5.30 cm  2.58 cm/m   RA Pressure: 3.00 mmHg LA Vol (A2C):   127.0 ml 61.89 ml/m  RA Area:     28.30 cm LA Vol (A4C):   118.0 ml 57.51 ml/m  RA Volume:   98.20 ml  47.86 ml/m LA Biplane Vol: 125.0 ml 60.92 ml/m AORTIC VALVE LVOT Vmax:   72.75 cm/s LVOT Vmean:  44.550 cm/s LVOT VTI:    0.121 m AI PHT:      545 msec  AORTA Ao Root diam: 4.20 cm Ao Asc diam:  4.50 cm  MITRAL VALVE               TRICUSPID VALVE MV Area (PHT): cm         TR Peak grad:   29.2 mmHg MV Decel Time: 317 msec    TR Vmax:        270.00 cm/s MR Peak grad: 92.7 mmHg    Estimated RAP:  3.00 mmHg MR Mean grad: 60.5 mmHg    RVSP:           32.2 mmHg MR Vmax:  481.50 cm/s MR Vmean:     367.5 cm/s   SHUNTS MV E velocity: 72.30 cm/s  Systemic VTI:  0.12 m MV A velocity: 33.30 cm/s  Systemic Diam: 2.40 cm MV E/A ratio:  2.17  Soyla Merck MD Electronically signed by Soyla Merck MD Signature Date/Time: 04/03/2023/4:59:47 PM    Final (Updated)   TEE  ECHO TEE 10/19/2024  Narrative TRANSESOPHOGEAL ECHO REPORT    Patient Name:   Daniel Love Date of Exam: 10/19/2024 Medical Rec #:  [id number]        Height:       70.0 in Accession #:    [id number]       Weight:       176.0 lb Date of Birth:  [birth year]        BSA:          1.977 m Patient Age:    83 years         BP:           99/69 mmHg Patient Gender: M                HR:           71 bpm. Exam Location:  Inpatient  Procedure: Transesophageal Echo, Cardiac Doppler, Color Doppler and 3D Echo (Both Spectral and Color Flow Doppler were utilized during procedure).  Indications:    S/P Watchman  History:        Patient has no prior history of Echocardiogram examinations and Patient has prior history of Echocardiogram examinations, most recent 08/27/2024.  Sonographer:    Jayson Gaskins Referring Phys: [id number] OLE ONEIDA HOLTS  PROCEDURE: After discussion of the risks and benefits of a TEE, an informed consent was obtained. TEE procedure time was 17 minutes. The transesophogeal probe was passed without difficulty through the esophogus of the patient. Sedation performed by different physician. The patient was monitored while under deep sedation. Anesthestetic sedation was provided intravenously by Anesthesiology: 200mg  of Propofol , 100mg  of Lidocaine . Image quality was excellent. The patient's vital signs; including heart rate, blood pressure, and oxygen saturation; remained stable throughout the procedure. The patient developed no complications during the procedure.  IMPRESSIONS   1. S/p LAAC with Watchman device. Successful closure of the LAA. 2. Left ventricular ejection fraction, by estimation, is 60 to 65%. The left ventricle has normal function. 3. Right ventricular systolic function is moderately reduced. The right ventricular size is severely enlarged. 4. S/p LAAC with 24 mm Watchman FLX. No device leak or device-related thrombus. Left atrial size was severely dilated. No left atrial/left atrial appendage thrombus was detected. 5. Small iatrogenic ASD with R to L shunting. Evidence of atrial level shunting detected by color flow Doppler. 6. Right atrial size was severely dilated. 7. Severe mitral regurgitation. 2/2 PMVL prolapse and  annular dilation. 2D ERO 0.15 cm2, R vol 23 cc. Systolic flow reversal in the R lower pulmonary vein. 3D VCA 0.50 cm2. Findings consistent with severe mitral regurgitation. The mitral valve is myxomatous. Severe mitral valve regurgitation. No evidence of mitral stenosis. 8. The tricuspid valve is abnormal. Tricuspid valve regurgitation is severe. 9. The aortic valve is tricuspid. Aortic valve regurgitation is mild. No aortic stenosis is present. 10. Aortic dilatation noted. There is moderate dilatation of the ascending aorta, measuring 44 mm. There is mild (Grade II) layered plaque involving the descending aorta. 11. 3D performed of the mitral valve, 3D performed of the tricuspid valve and 3D performed of the LAA.  FINDINGS  Left Ventricle: Left ventricular ejection fraction, by estimation, is 60 to 65%. The left ventricle has normal function. The left ventricular internal cavity size was normal in size.  Right Ventricle: The right ventricular size is severely enlarged. No increase in right ventricular wall thickness. Right ventricular systolic function is moderately reduced.  Left Atrium: S/p LAAC with 24 mm Watchman FLX. No device leak or device-related thrombus. Left atrial size was severely dilated. No left atrial/left atrial appendage thrombus was detected.  Right Atrium: Right atrial size was severely dilated.  Pericardium: Trivial pericardial effusion is present.  Mitral Valve: Severe mitral regurgitation. 2/2 PMVL prolapse and annular dilation. 2D ERO 0.15 cm2, R vol 23 cc. Systolic flow reversal in the R lower pulmonary vein. 3D VCA 0.50 cm2. Findings consistent with severe mitral regurgitation. The mitral valve is myxomatous. Severe mitral valve regurgitation. No evidence of mitral valve stenosis.  Tricuspid Valve: The tricuspid valve is abnormal. Tricuspid valve regurgitation is severe. No evidence of tricuspid stenosis. The flow in the hepatic veins is reversed during ventricular  systole.  Aortic Valve: The aortic valve is tricuspid. Aortic valve regurgitation is mild. No aortic stenosis is present.  Pulmonic Valve: The pulmonic valve was grossly normal. Pulmonic valve regurgitation is trivial. No evidence of pulmonic stenosis.  Aorta: Aortic dilatation noted and the aortic root is normal in size and structure. There is moderate dilatation of the ascending aorta, measuring 44 mm. There is mild (Grade II) layered plaque involving the descending aorta.  IAS/Shunts: Evidence of atrial level shunting detected by color flow Doppler. Small iatrogenic ASD with R to L shunting.  Additional Comments: 3D was performed not requiring image post processing on an independent workstation and was abnormal.   AORTA Ao Root diam: 3.90 cm Ao Asc diam:  4.41 cm  TRICUSPID VALVE TR Peak grad:   14.0 mmHg TR Vmax:        187.00 cm/s  Darryle Decent MD Electronically signed by Darryle Decent MD Signature Date/Time: 10/19/2024/12:28:06 PM    Final        ______________________________________________________________________________________________       Physical Exam:    VS:  BP 117/76   Pulse 64   Ht 5' 10 (1.778 m)   Wt 174 lb (78.9 kg)   SpO2 99%   BMI 24.97 kg/m    Wt Readings from Last 3 Encounters:  12/04/24 174 lb (78.9 kg)  10/19/24 174 lb (78.9 kg)  10/12/24 176 lb (79.8 kg)    Gen: NAD elderly male   Neck: no JVD Cardiac: No Rubs or Gallops, holosystolic murmur IRIR, +2 radial pulses Respiratory: Clear to auscultation bilaterally, normal effort, normal respiratory rate GI: Soft, nontender, non-distended  MS: no edema;  moves all extremities Integument: Skin feels warm Neuro:  At time of evaluation, alert and oriented to person/place/time/situation  Psych: Normal affect, patient feels well   ASSESSMENT AND PLAN: .    Mitral Valve Prolapse with Severe Mitral Regurgitation - Severe mitral regurgitation with mitral valve prolapse, characterized  by an eccentric leak. Currently asymptomatic with improved leg swelling and well-managed hemoglobin levels. Open heart surgery is not recommended due to age and comorbidities. MitraClip (edge-to-edge repair) is a potential non-surgical option if symptoms worsen. - Will repeat echocardiogram in six months to monitor mitral regurgitation. - will CC Dr. Lucas, I do not suspect   Aortic Insufficiency and Tricuspid Regurgitation - severe TR and mild/mod AI, will get echo as above  Atrial Fibrillation - Permanent atrial fibrillation contributing to  stroke risk and exacerbating valve regurgitation. Discussed Watchman device to reduce stroke risk and allow discontinuation of warfarin. Watchman requires short-term anticoagulation and potential for future bleeding issues. - now off AC  Chronic Kidney Disease Stage 4 - Chronic kidney disease stage 4 with fluctuating creatinine levels. Kidney function closely monitored due to impact of diuretics and fluid management. Balancing heart failure symptoms and preserving kidney function is critical. Continue current diuretics  Anemia Complex anemia with multifactorial etiology, including iron deficiency and chronic disease. As long as his iron is replete he is NYHA I  Longitudinal care: The evaluation and management services provided today reflect the complexity inherent in caring for this patient, including the ongoing longitudinal relationship and management of multiple chronic conditions and/or the need for care coordination. The visit required a comprehensive assessment and management plan tailored to the patient's unique needs Time was spent addressing not only the acute concerns but also the broader context of the patient's health, including preventive care, chronic disease management, and care coordination as appropriate.  Complex longitudinal is necessary for conditions including: complex valve disease with bleeding issue and sx anemia   Stanly Leavens, MD FASE [facility] Cardiologist [facility]  [address] [place], [address] [place], [place] [address] [phone]  1:18 PM

## 2024-12-31 ENCOUNTER — Other Ambulatory Visit: Payer: Self-pay | Admitting: Internal Medicine

## 2025-01-08 ENCOUNTER — Ambulatory Visit
Admission: RE | Admit: 2025-01-08 | Discharge: 2025-01-08 | Disposition: A | Source: Ambulatory Visit | Attending: Interventional Radiology | Admitting: Interventional Radiology

## 2025-01-08 ENCOUNTER — Other Ambulatory Visit: Payer: Self-pay | Admitting: Interventional Radiology

## 2025-01-08 DIAGNOSIS — D3001 Benign neoplasm of right kidney: Secondary | ICD-10-CM

## 2025-01-11 ENCOUNTER — Inpatient Hospital Stay
Admission: RE | Admit: 2025-01-11 | Discharge: 2025-01-11 | Attending: Interventional Radiology | Admitting: Interventional Radiology

## 2025-01-11 DIAGNOSIS — D3001 Benign neoplasm of right kidney: Secondary | ICD-10-CM

## 2025-01-11 HISTORY — PX: IR RADIOLOGIST EVAL & MGMT: IMG5224

## 2025-01-11 NOTE — Progress Notes (Signed)
 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   History of Present Illness: Daniel Love is a 85 y.o. male  status post biopsy and cryoablation of a 2.8 cm right renal mass on 02/21/2022. Biopsy demonstrated an oncocytic neoplasm without malignant cells visualized. He did well after the procedure and currently has no complaints. He denies any pain or urinary symptoms.   A follow up CT was performed without contrast due to his significant CKD on 01/08/25.  Past Medical History:  Diagnosis Date   Anal fissure    hx of    Arthritis    Atrial fibrillation (HCC)    maintaining normal sinus rhythm   Chronic anticoagulation    coumadin    Dyspepsia    Dysrhythmia    Afib   GERD (gastroesophageal reflux disease)    Glaucoma    History of colon polyps 2012   Colonoscopy-Dr. Rollin    History of echocardiogram    Echocardiogram 2/19: EF 60-65, normal wall motion, trivial AI, moderate to severe MR, mild LAE, mild RAE, mild to moderate TR, PASP 35   History of kidney stones    History of nuclear stress test    Myoview  2/19: EF 42, diffuse HK no significant ischemia, apical lateral scar   Hypertension    Prostate cancer (HCC)    Skin cancer    skin cancer removed from left arm and back    Past Surgical History:  Procedure Laterality Date   BACK SURGERY  12/31/1977   for degenerative disc disease   COLONOSCOPY     COLONOSCOPY WITH ESOPHAGOGASTRODUODENOSCOPY (EGD)     FLEXIBLE SIGMOIDOSCOPY N/A 04/25/2023   Procedure: FLEXIBLE SIGMOIDOSCOPY;  Surgeon: Avram Lupita BRAVO, MD;  Location: [facility];  Service: Gastroenterology;  Laterality: N/A;   HOT HEMOSTASIS N/A 04/25/2023   Procedure: HOT HEMOSTASIS (ARGON PLASMA COAGULATION/BICAP);  Surgeon: Avram Lupita BRAVO, MD;  Location: [facility];  Service: Gastroenterology;  Laterality: N/A;   IR RADIOLOGIST EVAL & MGMT  01/16/2022   IR  RADIOLOGIST EVAL & MGMT  06/04/2022   IR RADIOLOGIST EVAL & MGMT  08/05/2023   LEFT ATRIAL APPENDAGE OCCLUSION N/A 08/27/2024   Procedure: LEFT ATRIAL APPENDAGE OCCLUSION;  Surgeon: Cindie Ole DASEN, MD;  Location: [facility];  Service: Cardiovascular;  Laterality: N/A;   PROSTATE BIOPSY     RADIOLOGY WITH ANESTHESIA N/A 02/21/2022   Procedure: CT WITH ANESTHESIA CRYOABLATION;  Surgeon: Luverne Aran, MD;  Location: [facility];  Service: Radiology;  Laterality: N/A;   TEE WITHOUT CARDIOVERSION N/A 03/04/2019   Procedure: TRANSESOPHAGEAL ECHOCARDIOGRAM (TEE);  Surgeon: Alveta Aleene PARAS, MD;  Location: [facility];  Service: Cardiovascular;  Laterality: N/A;   toe removed     Right second toe   TRANSESOPHAGEAL ECHOCARDIOGRAM (CATH LAB) N/A 12/05/2023   Procedure: TRANSESOPHAGEAL ECHOCARDIOGRAM;  Surgeon: Santo Stanly LABOR, MD;  Location: [facility];  Service: Cardiovascular;  Laterality: N/A;   TRANSESOPHAGEAL ECHOCARDIOGRAM (CATH LAB) N/A 08/27/2024   Procedure: TRANSESOPHAGEAL ECHOCARDIOGRAM;  Surgeon: Cindie Ole DASEN, MD;  Location: [facility];  Service: Cardiovascular;  Laterality: N/A;   TRANSESOPHAGEAL ECHOCARDIOGRAM (CATH LAB) N/A 10/19/2024   Procedure: TRANSESOPHAGEAL ECHOCARDIOGRAM;  Surgeon: Barbaraann Darryle Ned, MD;  Location: [facility]AS[facility] CV [facility];  Service: Cardiovascular;  Laterality: N/A;   UPPER GASTROINTESTINAL ENDOSCOPY      Allergies: Amlodipine , Lumigan [bimatoprost], and Lisinopril  Medications: Prior to Admission medications  Medication Sig Start Date End Date Taking? Authorizing Provider  acetaminophen  (TYLENOL ) 500 MG tablet Take 1,000 mg by mouth every 6 (six) hours as needed (for pain.).    [provider]  allopurinol (ZYLOPRIM) 100 MG tablet Take 50-100 mg by mouth See admin instructions. Take 100 mg in the morning, and 50 mg at bedtime    [provider]  clobetasol cream (TEMOVATE) 0.05 % Apply 1 Application  topically 2 (two) times daily as needed (irritation). 01/28/24   [provider]  clopidogrel  (PLAVIX ) 75 MG tablet Take 1 tablet (75 mg total) by mouth daily. 10/12/24   Lesia Ozell Barter, PA-C  clotrimazole-betamethasone (LOTRISONE) cream Apply 1 Application topically daily as needed (rash). 12/03/18   [provider]  colchicine 0.6 MG tablet Take 0.6 mg by mouth daily as needed (gout flare). 07/30/21   [provider]  dapagliflozin  propanediol (FARXIGA ) 10 MG TABS tablet TAKE 1 TABLET BY MOUTH DAILY BEFORE BREAKFAST 01/01/25   Chandrasekhar, [name] A, MD  Darbepoetin Alfa  (ARANESP ) 300 MCG/0.6ML SOSY injection Inject 300 mcg into the skin every 21 ( twenty-one) days. Patient taking differently: Inject 300 mcg into the skin as needed. When iron get low    [provider]  diclofenac Sodium (VOLTAREN) 1 % GEL Apply 1 Application topically as needed (pain).    [provider]  diphenhydramine-acetaminophen  (TYLENOL  PM) 25-500 MG TABS tablet Take 1 tablet by mouth at bedtime as needed (sleep).    [provider]  ferrous sulfate 325 (65 FE) MG tablet Take 325 mg by mouth daily with breakfast.    [provider]  furosemide  (LASIX ) 40 MG tablet TAKE 1 TABLET BY MOUTH 2 TIMES A DAY 11/30/24   Chandrasekhar, [name] A, MD  isosorbide  mononitrate (IMDUR ) 30 MG 24 hr tablet Take 15 mg by mouth in the morning. 10/13/18   [provider]  Lidocaine  HCl (GOLD BOND PAIN & ITCH RELIEF EX) Apply 1 Application topically as needed (pain).    [provider]  melatonin 5 MG TABS Take 5 mg by mouth 2 (two) times daily as needed (sleep). Bedtime    [provider]  Menthol, Topical Analgesic, (ICY HOT BACK EX) Apply 1 application topically daily as needed (pain).    [provider]  Multiple Vitamin (MULTIVITAMIN WITH MINERALS) TABS tablet Take 1 tablet by mouth in the morning. Centrum Silver    [provider]   olmesartan  (BENICAR ) 40 MG tablet TAKE 1 TABLET BY MOUTH DAILY 08/03/24   Chandrasekhar, [name] A, MD  oxymetazoline  (AFRIN) 0.05 % nasal spray Place 1 spray into both nostrils at bedtime as needed for congestion.    [provider]  Phenylephrine -Acetaminophen  5-325 MG TABS Take 325 mg by mouth as needed (pain). Sudafed PR pressure and pain    [provider]  psyllium (HYDROCIL/METAMUCIL) 95 % PACK Take 1 packet by mouth daily.    [provider]  timolol  (TIMOPTIC ) 0.5 % ophthalmic solution Place 1 drop into both eyes 2 (two) times daily. 03/07/20   [provider]     Family History  Problem Relation Age of Onset   Cancer Mother        colon   Heart failure Father    Cancer Father        lung   Heart attack Father    Heart disease Brother    Prostate cancer Brother    Colon cancer Maternal Grandmother  Healthy Daughter    Healthy Daughter    Healthy Daughter    Stroke Neg Hx    Esophageal cancer Neg Hx    Rectal cancer Neg Hx    Stomach cancer Neg Hx    Liver disease Neg Hx     Social History   Socioeconomic History   Marital status: Married    Spouse name: Not on file   Number of children: 3   Years of education: Not on file   Highest education level: Not on file  Occupational History   Occupation: Retired  Tobacco Use   Smoking status: Former    Current packs/day: 0.00    Average packs/day: 1 pack/day for 7.0 years (7.0 ttl pk-yrs)    Types: Cigarettes    Start date: 01/01/1960    Quit date: 12/31/1966    Years since quitting: 58.0    Passive exposure: Past   Smokeless tobacco: Never  Vaping Use   Vaping status: Never Used  Substance and Sexual Activity   Alcohol use: Yes    Alcohol/week: 1.0 standard drink of alcohol    Types: 1 Glasses of wine per week    Comment: 1 weekly   Drug use: No   Sexual activity: Not Currently  Other Topics Concern   Not on file  Social History Narrative   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 They share three daughters, two live local, one in [place]    09-26-18 Unable to ask abuse questions wife and daughter with him today.   Social Drivers of Health   Tobacco Use: Medium Risk (12/22/2024)   Received from [facility]   Patient History    Smoking Tobacco Use: Former    Smokeless Tobacco Use: Never    Passive Exposure: Not on file  Financial Resource Strain: Low Risk (11/12/2024)   Received [facility]   Overall Financial Resource Strain (CARDIA)    How hard is it for you to pay for the very basics like food, housing, medical care, and heating?: Not hard at all  Food Insecurity: No Food Insecurity (11/12/2024)   Received from [facility]   Epic    Within the past 12 months, you worried that your food would run out before you got the money to buy more.: Never true    Within the past 12 months, the food you bought just didn't last and you didn't have money to get more.: Never true  Transportation Needs: No Transportation Needs (11/12/2024)   Received from [facility]    In the past 12 months, has lack of transportation kept you from medical appointments or from getting medications?: No    In the past 12 months, has lack of transportation kept you from meetings, work, or from getting things needed for daily living?: No  Physical Activity: Sufficiently Active (11/12/2024)   Received from [facility]   Exercise Vital Sign    On average, how many days per week do you engage in moderate to strenuous exercise (like a brisk walk)?: 5 days    On average, how many minutes do you engage in exercise at this level?: 30 min  Stress: No Stress Concern Present (11/12/2024)   Received from [facility] - Occupational Stress Questionnaire    Do you feel stress - tense, restless, nervous, or anxious, or unable to sleep at night because your mind is troubled all the time -  these days?: Not at all   Social Connections: Socially Integrated (11/12/2024)   Received from [facility]   Social Network    How would you rate your social network (family, work, friends)?: Good participation with social networks  Depression (PHQ2-9): Not on file  Alcohol Screen: Not on file  Housing: Low Risk (11/12/2024)   Received from [facility]    In the last 12 months, was there a time when you were not able to pay the mortgage or rent on time?: No    In the past 12 months, how many times have you moved where you were living?: 0    At any time in the past 12 months, were you homeless or living in a shelter (including now)?: No  Utilities: Not At Risk (11/12/2024)   Received from [facility]    In the past 12 months has the electric, gas, oil, or water company threatened to shut off services in your home?: No  Health Literacy: Not on file    ECOG Status: 0 - Asymptomatic  Review of Systems: A 12 point ROS discussed and pertinent positives are indicated in the HPI above.  All other systems are negative.  Review of Systems  Constitutional: Negative.   Respiratory: Negative.    Cardiovascular: Negative.   Gastrointestinal: Negative.   Genitourinary: Negative.   Musculoskeletal: Negative.   Neurological: Negative.     Vital Signs: BP 98/66 (BP Location: Left Arm, Patient Position: Sitting, Cuff Size: Normal) Comment (BP Location): Pt states BP runs low  Pulse 60   Temp (!) 97.4 F (36.3 C) (Axillary)   Resp 18   SpO2 96%    Physical Exam Vitals reviewed.  Constitutional:      General: He is not in acute distress.    Appearance: Normal appearance. He is not ill-appearing or toxic-appearing.  Abdominal:     General: There is no distension.     Palpations: Abdomen is soft. There is no mass.     Tenderness: There is no abdominal tenderness. There is no right CVA tenderness, guarding or rebound.  Musculoskeletal:        General: No swelling.  Neurological:      General: No focal deficit present.     Mental Status: He is alert and oriented to person, place, and time.      Imaging: No results found.  Labs:  CBC: Recent Labs    03/17/24 1408 06/30/24 1416 08/17/24 1129 08/27/24 0648 10/12/24 1133  WBC 5.9 7.3 7.0  --  6.6  HGB 11.2* 8.7* 10.2* 11.9* 10.3*  HCT 34.4* 27.6* 32.9* 35.0* 33.1*  PLT 165 241 204  --  186    COAGS: Recent Labs    01/21/24 1340 02/05/24 1604  INR 3.1* 1.4*    BMP: Recent Labs    04/28/24 1444 06/30/24 1416 08/17/24 1129 08/27/24 0648 10/12/24 1133  NA 142 140 141 142 144  K 4.4 4.9 4.3 3.9 4.4  CL 106 105 104 108 105  CO2 26 27 22   --  23  GLUCOSE 88 96 79 102* 84  BUN 55* 65* 58* 61* 62*  CALCIUM 9.3 9.3 9.3  --  9.2  CREATININE 2.11* 2.16* 2.48* 2.50* 2.18*    LIVER FUNCTION TESTS: Recent Labs    02/03/24 1146 03/17/24 1408 04/28/24 1444 06/30/24 1416  BILITOT 0.5 0.4 0.4 0.3  AST 25 18 20 18   ALT 18 11 11 10   PROT 6.7  6.6 6.5 6.4     Assessment and Plan:  Mr. Counterman was accompanied today by his daughter. We reviewed the recent CT of the abdomen which shows continued contraction of the right renal ablation defect which measures approximately 1.5 x 2.2 cm on axial images compared to 2.2 x 3.4 cm on 06/10/23 and 1.7 x 2.7 cm on 10/23/24. There is no evidence of tumor recurrence. I recommended just one more follow up CT abdomen without contrast in February, 2027, at which time he will be 4 years post ablation, given the oncocytic histology on biopsy.    Electronically Signed: Marcey ONEIDA Moan 01/11/2025, 10:27 AM    I spent a total of  15 Minutes in face to face in clinical consultation, greater than 50% of which was counseling/coordinating care post cryoablation of a right renal neoplasm.  "

## 2025-01-30 ENCOUNTER — Other Ambulatory Visit: Payer: Self-pay | Admitting: Internal Medicine

## 2025-02-02 ENCOUNTER — Telehealth: Payer: Self-pay

## 2025-02-02 NOTE — Telephone Encounter (Signed)
 Called patient to arrange 6 month post LAAO follow up (around April). Patient was driving and requested a call back later to arrange.

## 2025-02-02 NOTE — Telephone Encounter (Signed)
 Left message for patient to return call to arrange OV.

## 2025-03-03 ENCOUNTER — Ambulatory Visit: Admitting: Rheumatology

## 2025-04-28 ENCOUNTER — Ambulatory Visit: Admitting: Student

## 2025-06-04 ENCOUNTER — Other Ambulatory Visit (HOSPITAL_BASED_OUTPATIENT_CLINIC_OR_DEPARTMENT_OTHER)
